# Patient Record
Sex: Male | Born: 1935 | Race: White | Hispanic: No | Smoking: Never smoker
Health system: Southern US, Community
[De-identification: ages and names within clinical notes are randomized; demographics above are authoritative.]

## PROBLEM LIST (undated history)

## (undated) DIAGNOSIS — M79609 Pain in unspecified limb: Secondary | ICD-10-CM

## (undated) DIAGNOSIS — L570 Actinic keratosis: Secondary | ICD-10-CM

## (undated) DIAGNOSIS — Z13220 Encounter for screening for lipoid disorders: Secondary | ICD-10-CM

## (undated) DIAGNOSIS — G4700 Insomnia, unspecified: Secondary | ICD-10-CM

## (undated) DIAGNOSIS — R0602 Shortness of breath: Secondary | ICD-10-CM

## (undated) DIAGNOSIS — D485 Neoplasm of uncertain behavior of skin: Secondary | ICD-10-CM

## (undated) DIAGNOSIS — H3320 Serous retinal detachment, unspecified eye: Secondary | ICD-10-CM

## (undated) DIAGNOSIS — M545 Low back pain: Secondary | ICD-10-CM

## (undated) DIAGNOSIS — E559 Vitamin D deficiency, unspecified: Secondary | ICD-10-CM

## (undated) HISTORY — DX: Actinic keratosis: L57.0

## (undated) HISTORY — DX: Serous retinal detachment, unspecified eye: H33.20

## (undated) HISTORY — DX: Vitamin D deficiency, unspecified: E55.9

## (undated) HISTORY — DX: Neoplasm of uncertain behavior of skin: D48.5

## (undated) HISTORY — DX: Pain in unspecified limb: M79.609

## (undated) HISTORY — DX: Low back pain: M54.5

## (undated) HISTORY — DX: Shortness of breath: R06.02

## (undated) HISTORY — DX: Insomnia, unspecified: G47.00

## (undated) HISTORY — DX: Encounter for screening for lipoid disorders: Z13.220

---
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
## 1995-03-04 HISTORY — PX: RETINAL DETACHMENT SURGERY: SHX105

## 1998-03-03 DIAGNOSIS — H3320 Serous retinal detachment, unspecified eye: Secondary | ICD-10-CM

## 1998-03-03 HISTORY — DX: Serous retinal detachment, unspecified eye: H33.20

## 2003-01-02 DIAGNOSIS — M545 Low back pain, unspecified: Secondary | ICD-10-CM

## 2003-01-02 HISTORY — DX: Low back pain, unspecified: M54.50

## 2005-01-21 ENCOUNTER — Ambulatory Visit (HOSPITAL_COMMUNITY): Admission: RE | Admit: 2005-01-21 | Discharge: 2005-01-21 | Payer: Self-pay | Admitting: General Surgery

## 2005-01-21 HISTORY — PX: INGUINAL HERNIA REPAIR: SHX194

## 2007-03-04 HISTORY — PX: LID LESION EXCISION: SHX5204

## 2008-06-08 DIAGNOSIS — G4700 Insomnia, unspecified: Secondary | ICD-10-CM

## 2008-06-08 HISTORY — DX: Insomnia, unspecified: G47.00

## 2008-09-07 DIAGNOSIS — R0602 Shortness of breath: Secondary | ICD-10-CM

## 2008-09-07 HISTORY — DX: Shortness of breath: R06.02

## 2009-05-01 ENCOUNTER — Ambulatory Visit: Payer: Self-pay

## 2009-05-01 IMAGING — CT CT ORBITS WO/W CM
2 series · 15 of 30 positions shown, 19 images · non-contrast
Comparison: none

REASON FOR EXAM: orbital mass  DR SIMS [HOSPITAL]  ph
[PHONE_NUMBER]  fx  [PHONE_NUMBER]
COMMENTS:

[Series 2: orbits_wo · axial · 0.33mm/px · z∈[-199,-124]mm · 13 of 31 slices shown, 17 images]
[im 3/31  brain]
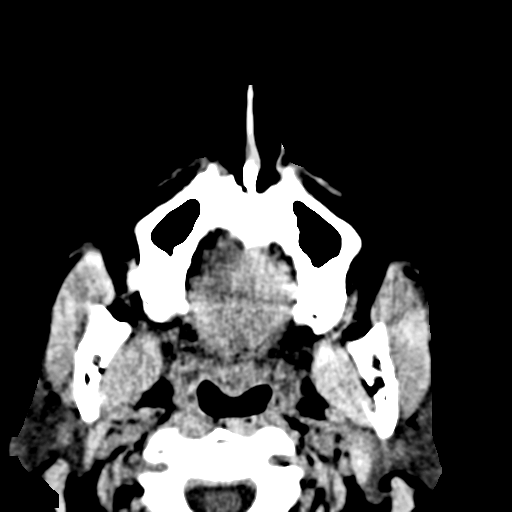
[im 3/31  bone]
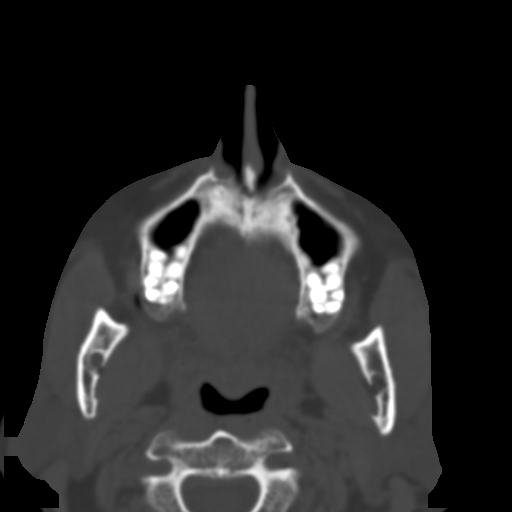
[im 5/31  bone]
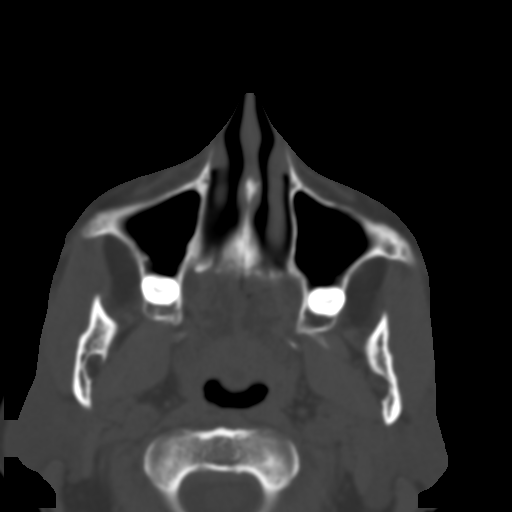
[im 7/31  bone]
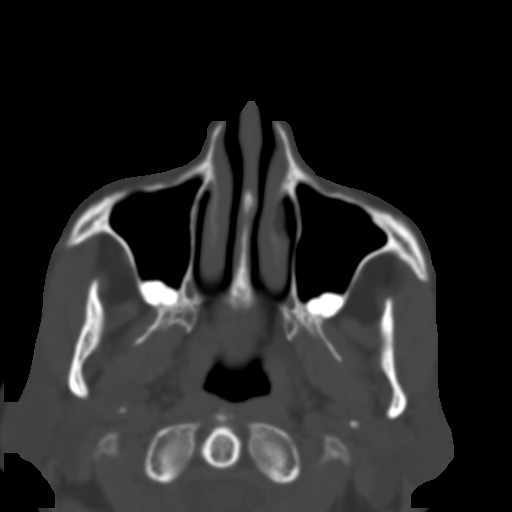
[im 9/31  bone]
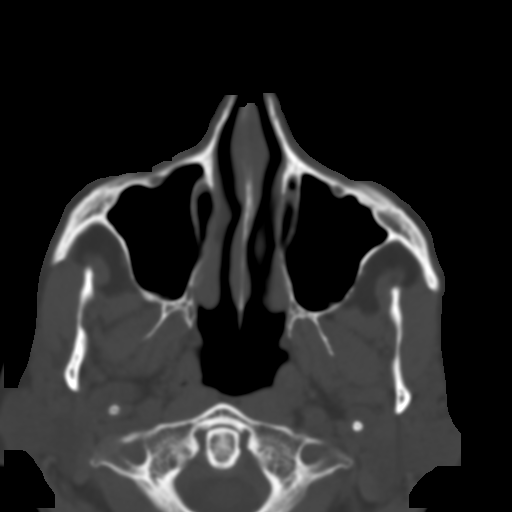
[im 11/31  brain]
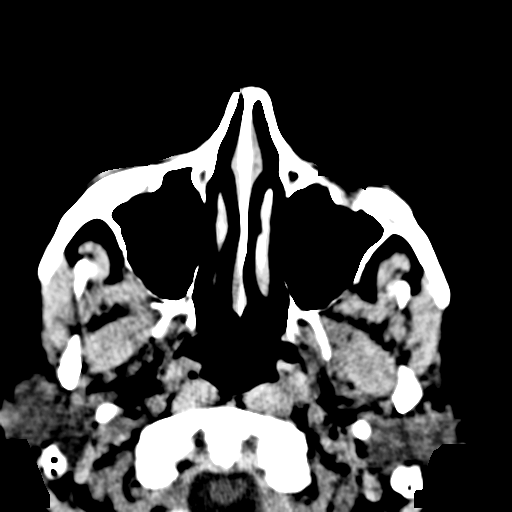
[im 11/31  bone]
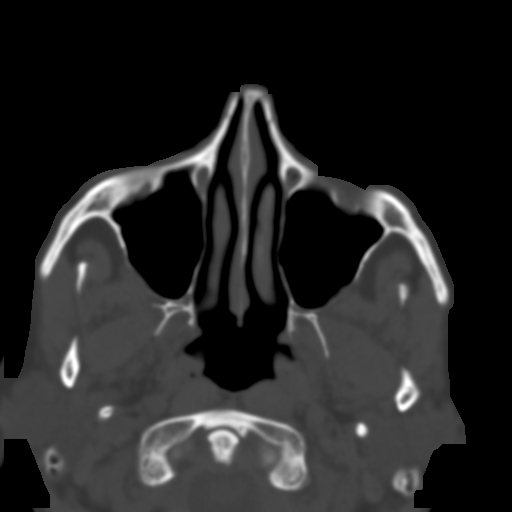
[im 13/31  bone]
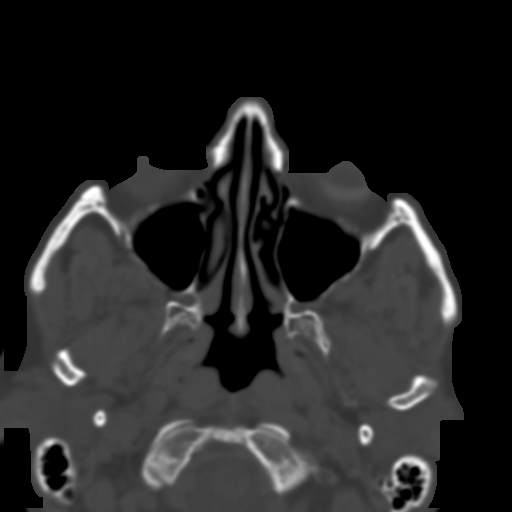
[im 16/31  bone]
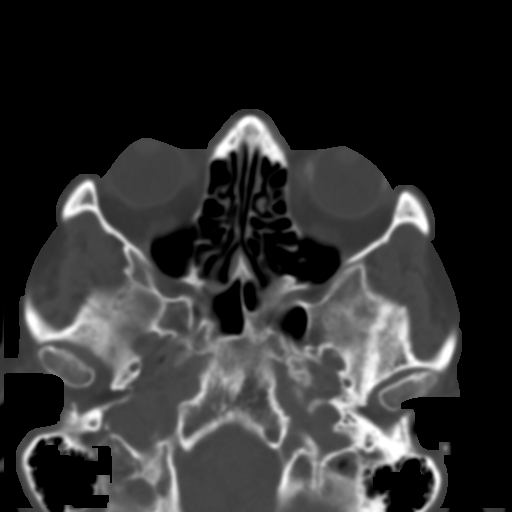
[im 18/31  bone]
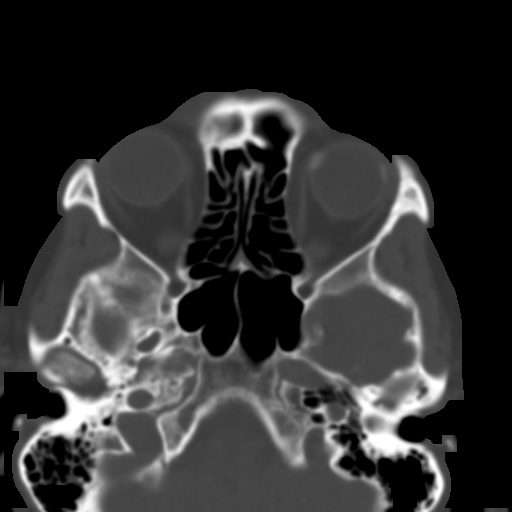
[im 20/31  brain]
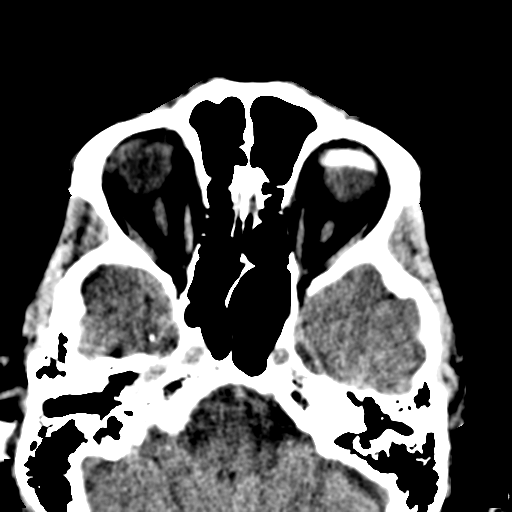
[im 20/31  bone]
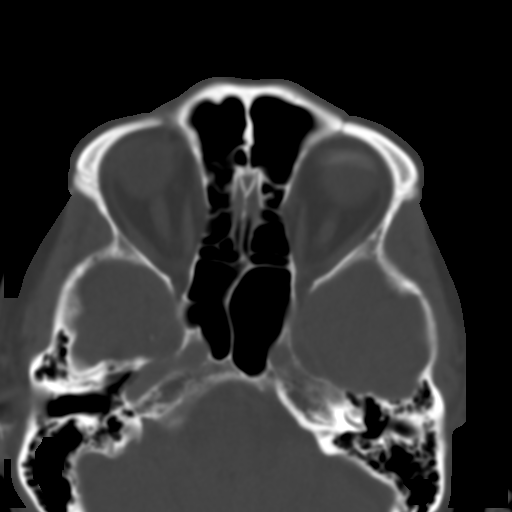
[im 22/31  bone]
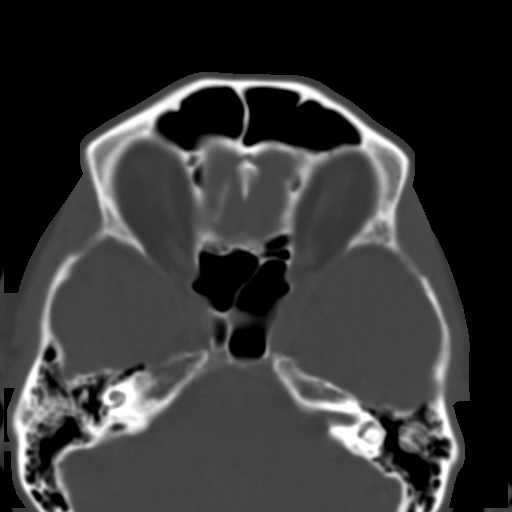
[im 24/31  bone]
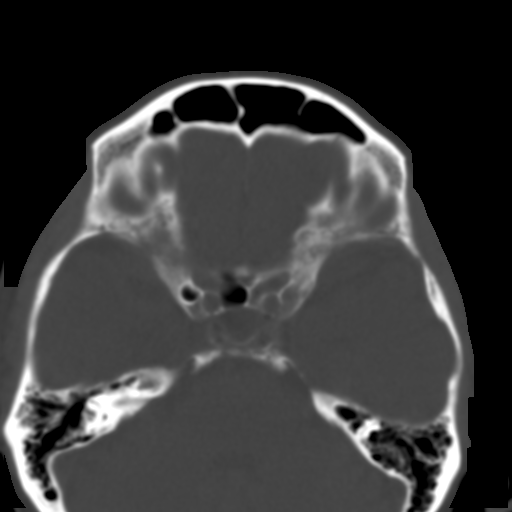
[im 26/31  bone]
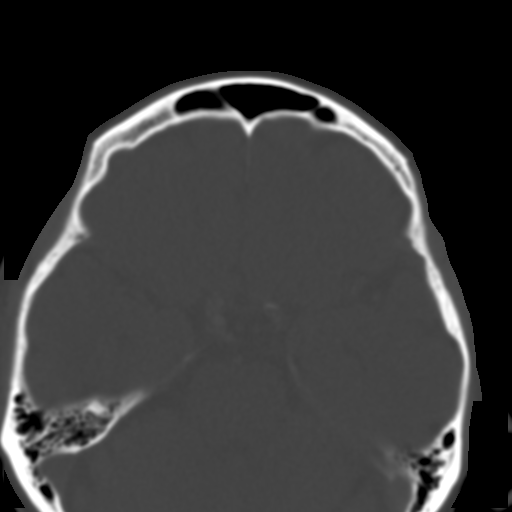
[im 28/31  brain]
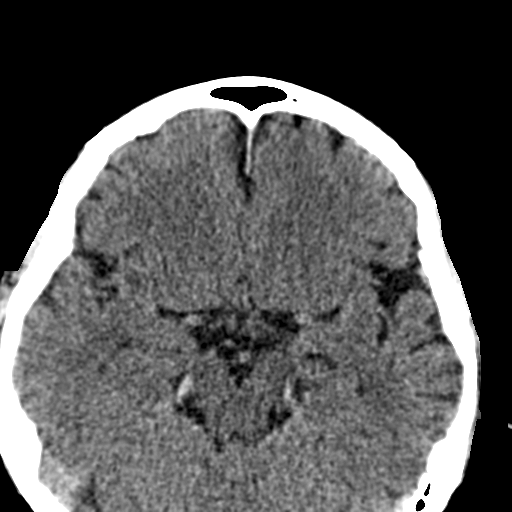
[im 28/31  bone]
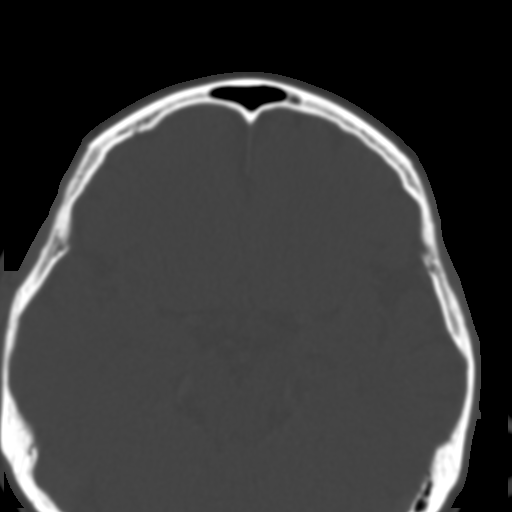

[Series 5: orbits_w st · axial · 0.33mm/px · z∈[-186,-174]mm · 2 of 30 slices shown]
[im 3/30  bone]
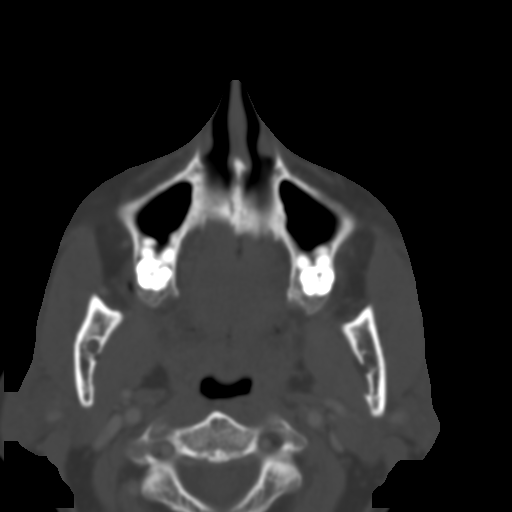
[im 7/30  bone]
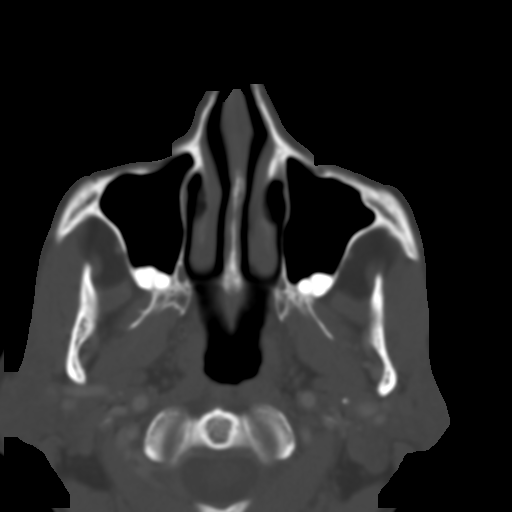

[15 of 30 positions shown; findings below may reference images not displayed]

PROCEDURE:     CT  - CT ORBITS OR TEMPORAL BONE W/WO  - [DATE] [DATE]

RESULT:     Noted along the a medial aspect of the orbit in the region of
the canthal fold is a small soft tissue density measuring approximately
cm corresponding to what appears to be a small varix on clinical exam. 75 mL
of [1D] was administered. There may be minimal enhancement of this
lesion. The exam was performed with Valsalva maneuver. There has been no
significant change in size of the lesion noted. Retrobulbar tissue is
normal. The globe is normal. Paranasal sinuses are clear. Vascular
structures at the skull base are unremarkable.
IMPRESSION: 1.     Small 1.0 cm maximal diameter soft tissue mass along the medial
orbit. No retrobulbar abnormalities are noted.  Minimal enhancement may be
present.  No prominent distention by Valsalva noted. No other associated
lesion is noted. Based on clinical exam and CT findings this could represent
a small varix.
2.     Intraorbital structures and intracranial vascular structures are
normal. The optic nerves, globe and extraocular eye muscles appear normal.

## 2010-12-09 IMAGING — CR DG HUMERUS 2V *R*
2 series · 2 of 2 positions shown · non-contrast
Comparison: None

CLINICAL DATA: Fell 1 week ago with pain

RIGHT HUMERUS - 2+ VIEW

[w humerus ap right *]
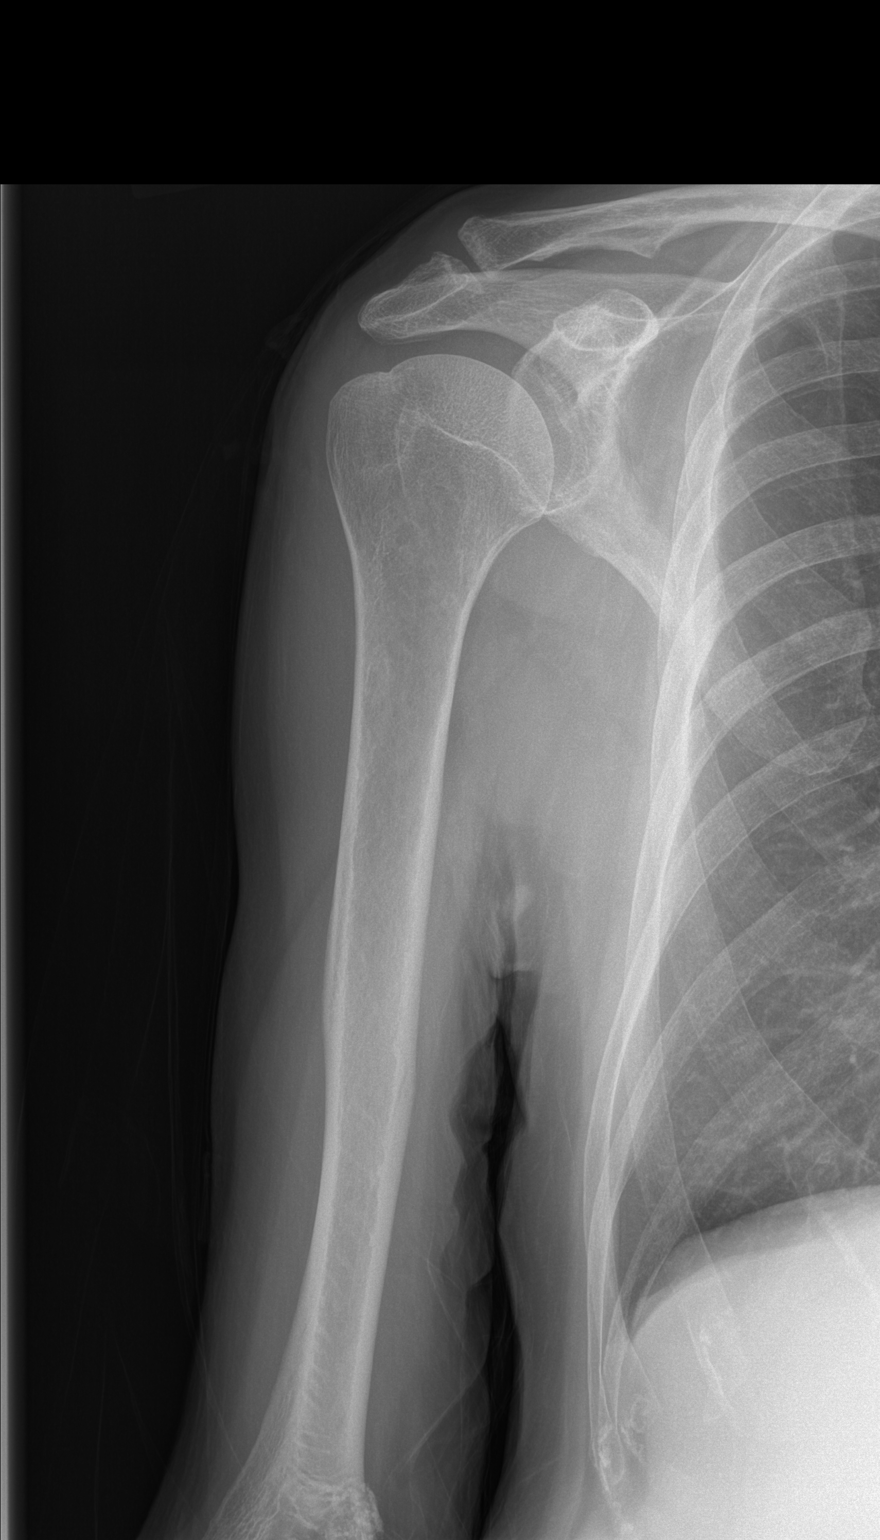

[w humerus lat right *]
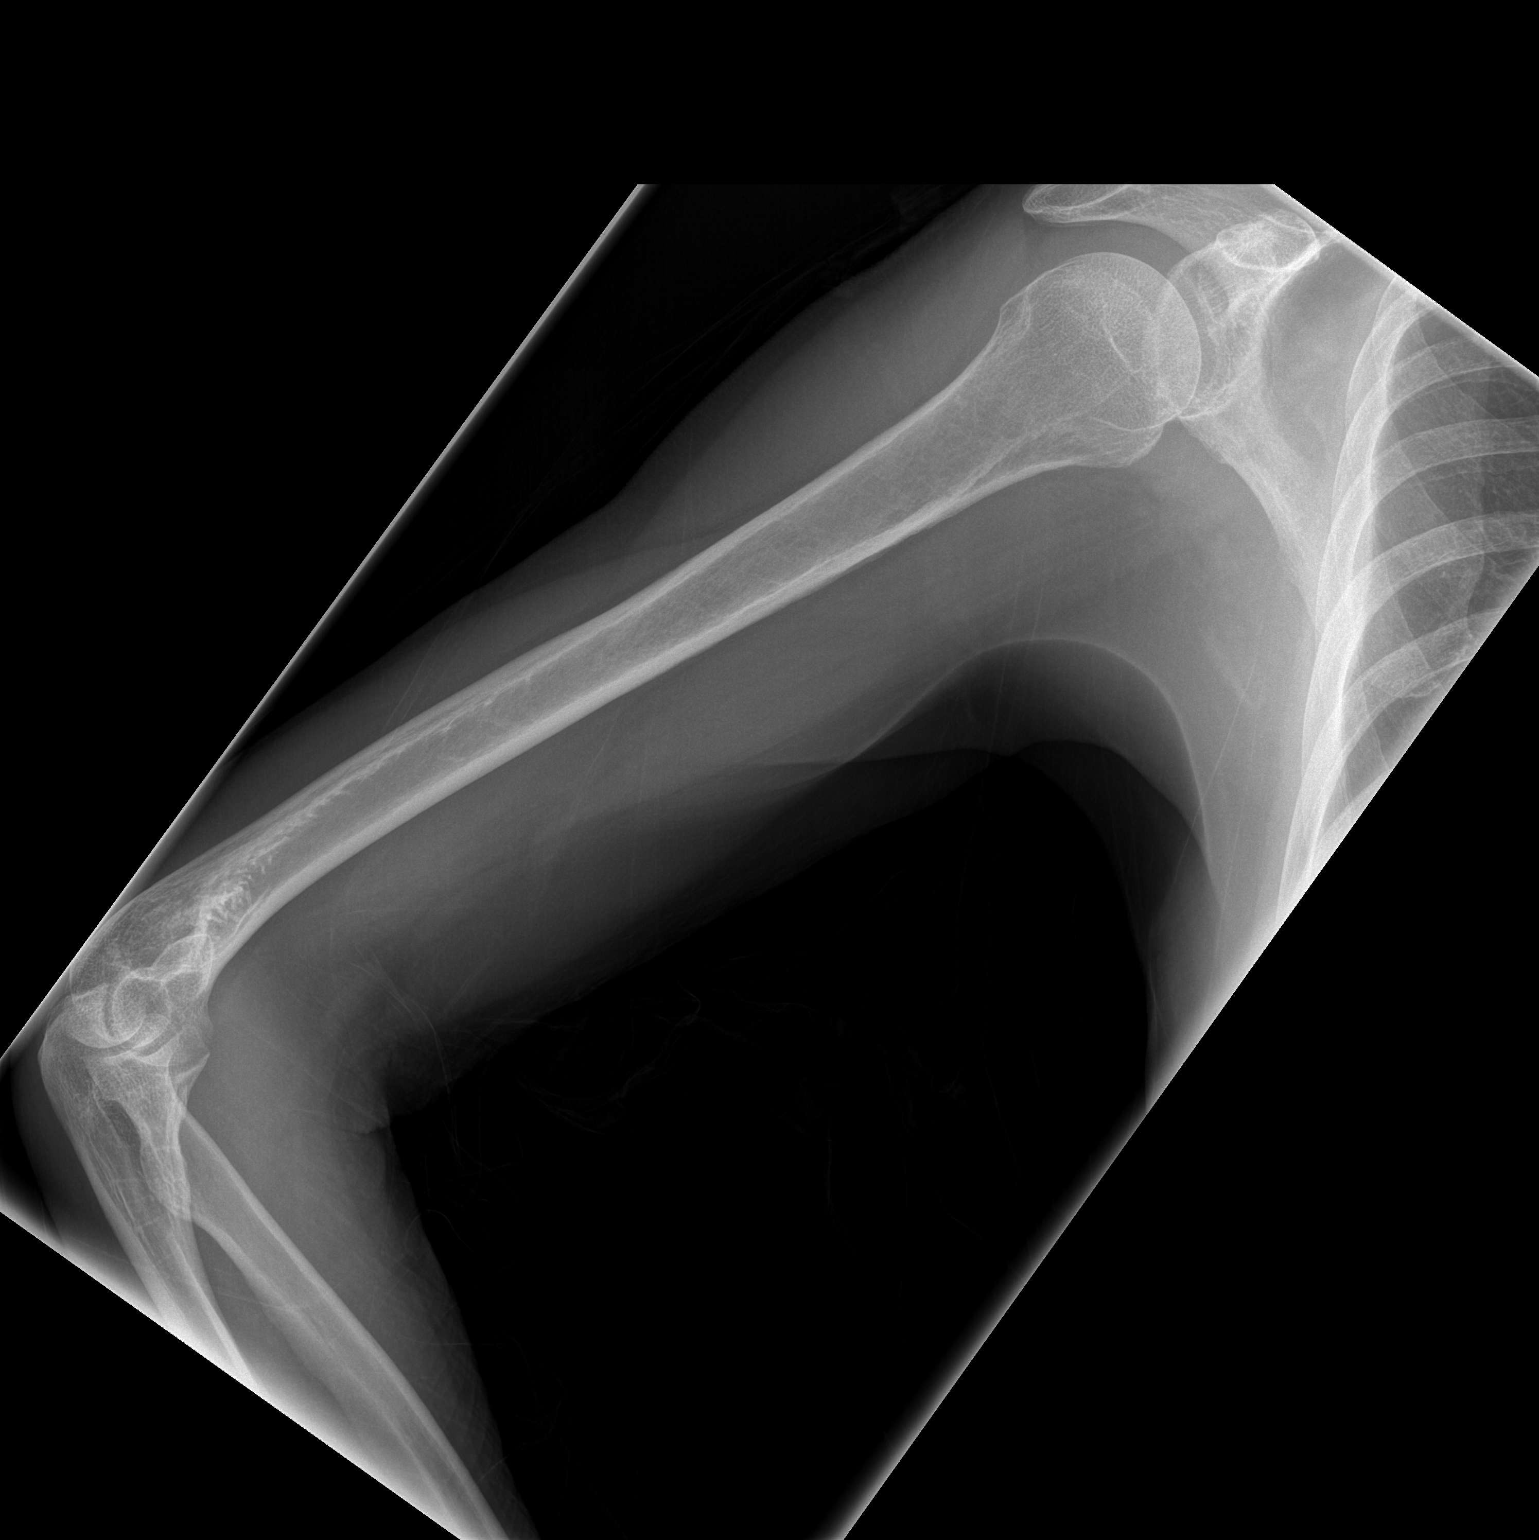

[2 of 2 positions shown; findings below may reference images not displayed]

FINDINGS: Views of the right humerus show no acute fracture.  The
bones are somewhat osteopenic.  The humeral head is normally
positioned within the glenohumeral joint.
IMPRESSION: Negative right humerus.

## 2011-02-05 ENCOUNTER — Ambulatory Visit
Admission: RE | Admit: 2011-02-05 | Discharge: 2011-02-05 | Disposition: A | Discharge status: Home / Self Care | Payer: Medicare Other | Source: Ambulatory Visit | Attending: Internal Medicine | Admitting: Internal Medicine

## 2011-02-05 ENCOUNTER — Other Ambulatory Visit: Payer: Self-pay | Admitting: Internal Medicine

## 2011-02-05 DIAGNOSIS — M79609 Pain in unspecified limb: Secondary | ICD-10-CM

## 2011-02-05 DIAGNOSIS — G8929 Other chronic pain: Secondary | ICD-10-CM

## 2011-02-05 HISTORY — DX: Pain in unspecified limb: M79.609

## 2011-02-05 IMAGING — CR DG FOREARM 2V*R*
2 series · 2 of 2 positions shown · non-contrast
Comparison: None.

CLINICAL DATA: Fell 1 week ago with pain

RIGHT FOREARM - 2 VIEW

[x forearm ap right]
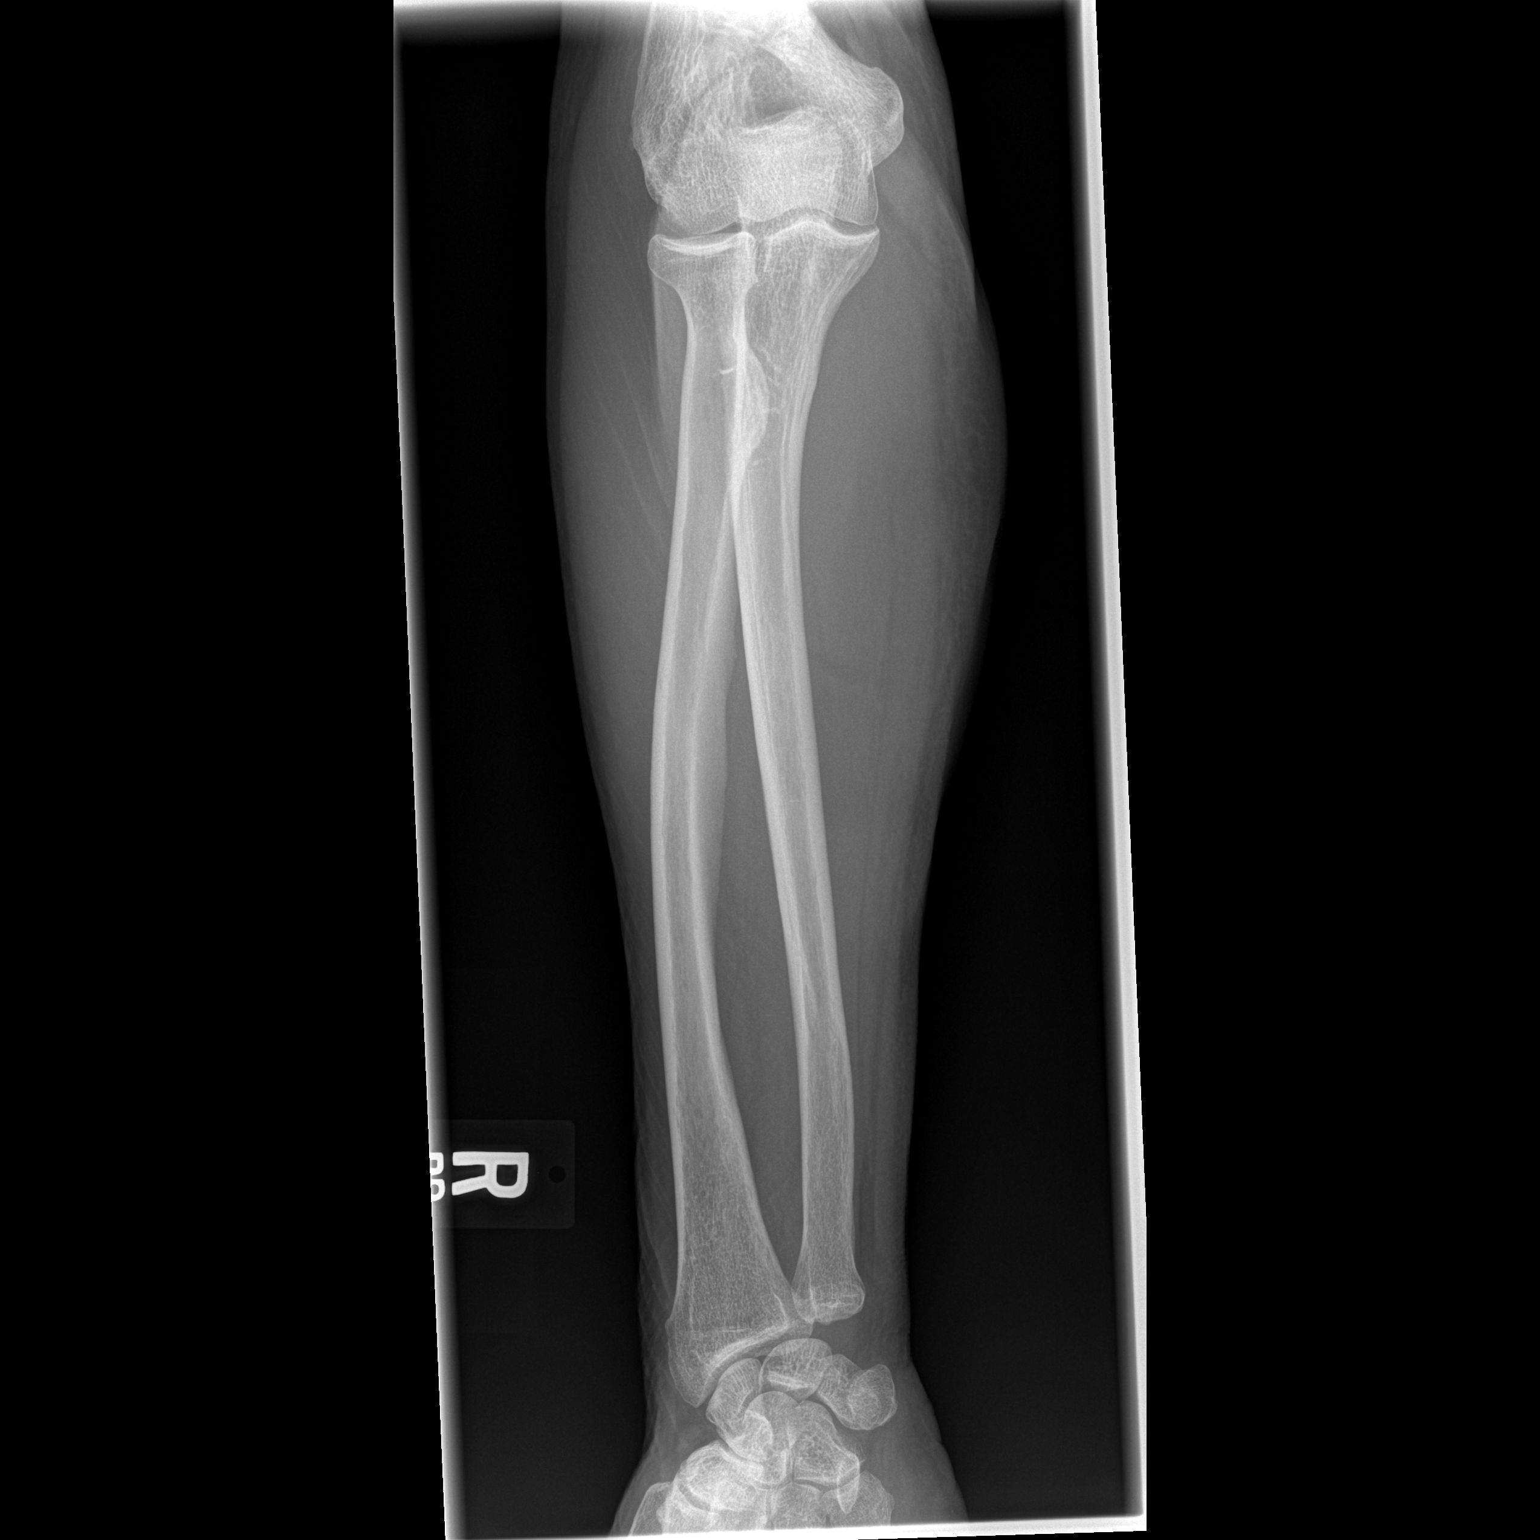

[x forearm lat right]
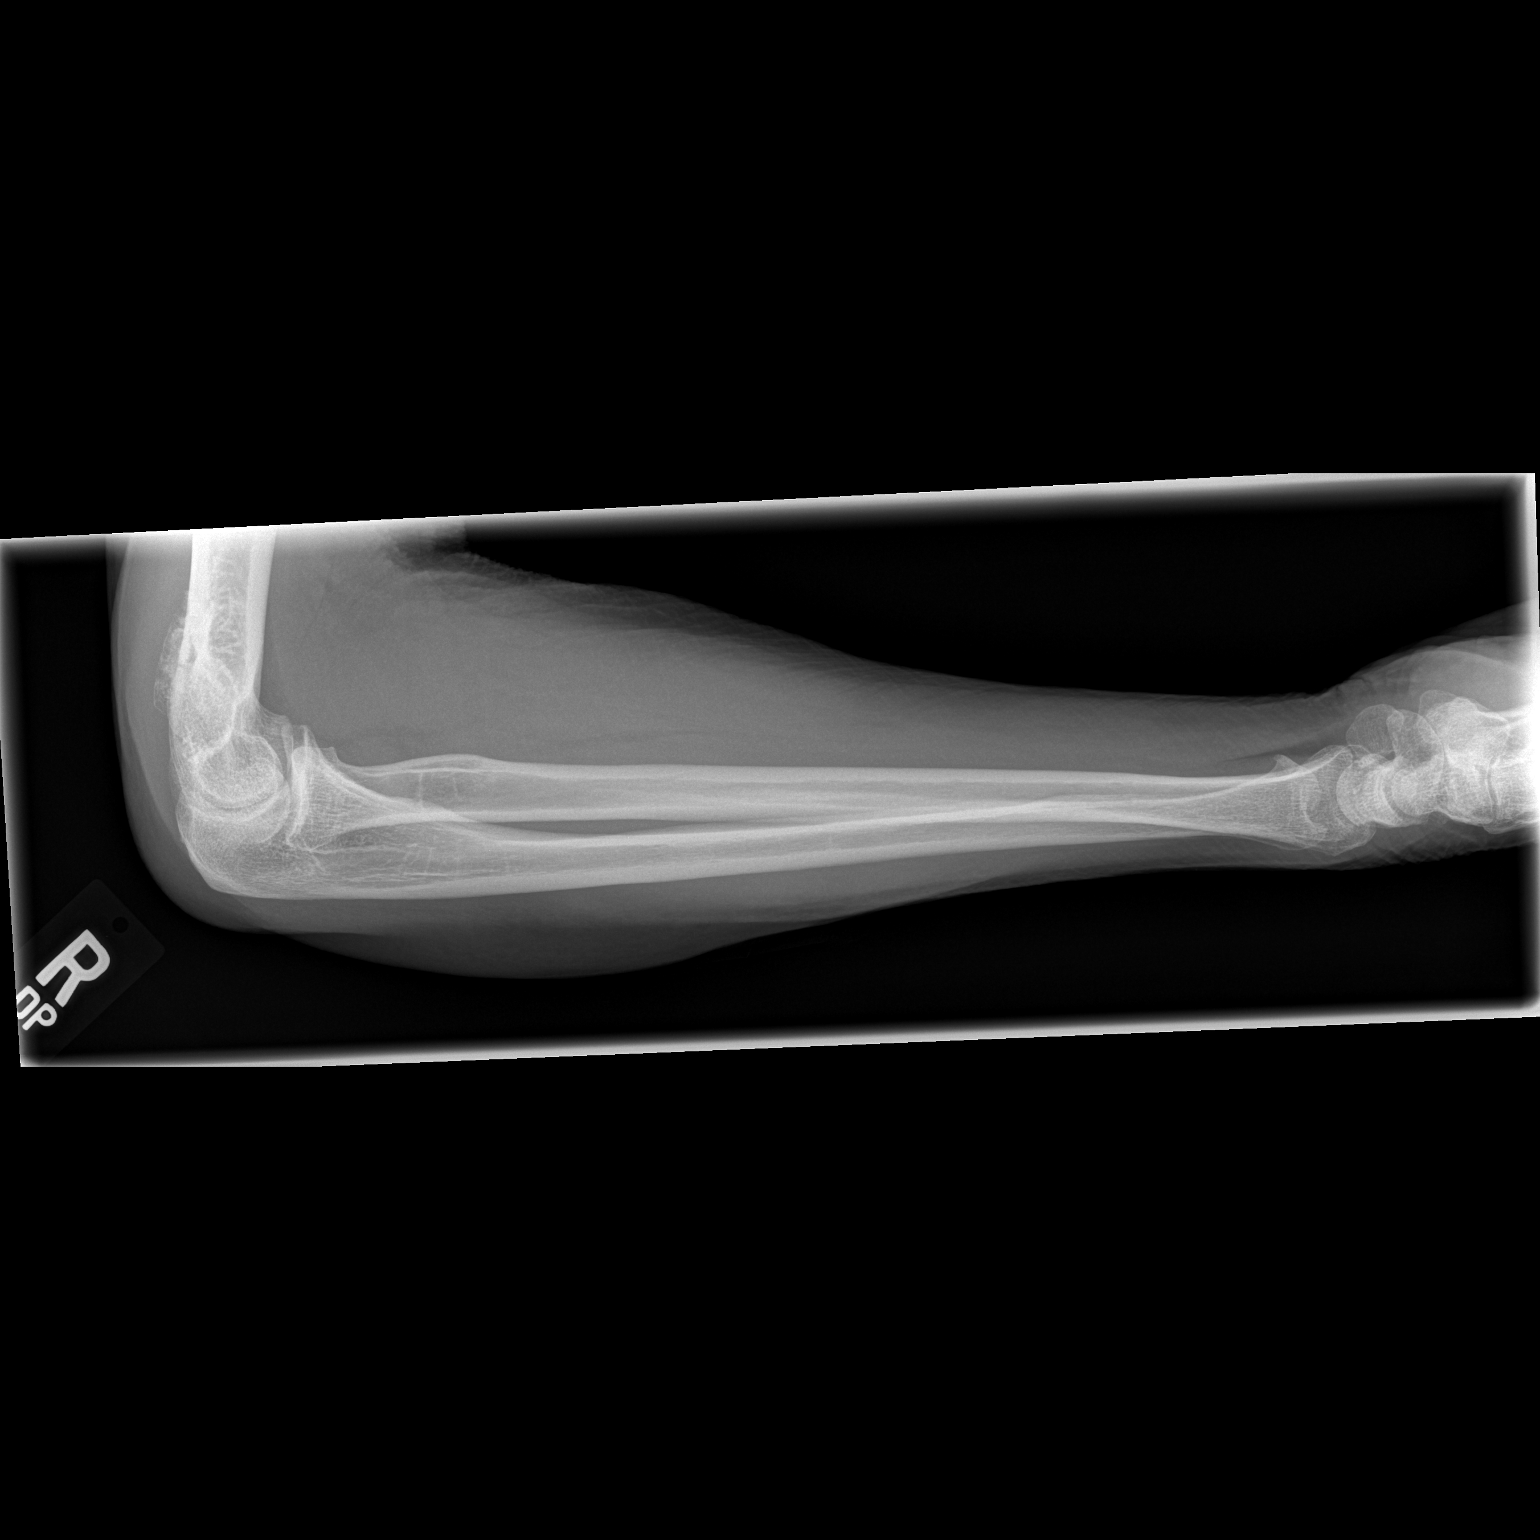

[2 of 2 positions shown; findings below may reference images not displayed]

FINDINGS: No acute fracture is seen.  Alignment is normal.  No
elbow joint effusion is seen.  There is a bony density along the
dorsal aspect of the distal right humerus of uncertain
significance.  If further assessment is warranted, MRI would be
recommended.
IMPRESSION: 1.  No fracture.  No joint effusion.
2.  Abnormal bony density along the distal right humerus
posteriorly.  Consider MRI to assess further.

## 2012-03-09 LAB — CBC AND DIFFERENTIAL
HCT: 40 % — AB (ref 41–53)
Hemoglobin: 13.7 g/dL (ref 13.5–17.5)
Platelets: 248 10*3/uL (ref 150–399)
WBC: 4.9 10^3/mL

## 2012-03-09 LAB — LIPID PANEL
Cholesterol: 218 mg/dL — AB (ref 0–200)
HDL: 73 mg/dL — AB (ref 35–70)
LDL Cholesterol: 133 mg/dL
Triglycerides: 59 mg/dL (ref 40–160)

## 2012-03-09 LAB — BASIC METABOLIC PANEL
BUN: 14 mg/dL (ref 4–21)
Creatinine: 0.9 mg/dL (ref 0.6–1.3)
Glucose: 90 mg/dL
Potassium: 4.3 mmol/L (ref 3.4–5.3)
Sodium: 140 mmol/L (ref 137–147)

## 2012-03-09 LAB — HEPATIC FUNCTION PANEL
ALT: 10 U/L (ref 10–40)
AST: 17 U/L (ref 14–40)

## 2012-03-09 LAB — HEMOGLOBIN A1C: Hgb A1c MFr Bld: 5.9 % (ref 4.0–6.0)

## 2012-04-05 DIAGNOSIS — L570 Actinic keratosis: Secondary | ICD-10-CM | POA: Insufficient documentation

## 2012-04-05 DIAGNOSIS — D485 Neoplasm of uncertain behavior of skin: Secondary | ICD-10-CM

## 2012-04-05 HISTORY — DX: Actinic keratosis: L57.0

## 2012-04-05 HISTORY — DX: Neoplasm of uncertain behavior of skin: D48.5

## 2012-05-01 HISTORY — PX: EXTERNAL EAR SURGERY: SHX627

## 2012-05-28 HISTORY — PX: MOHS SURGERY: SHX181

## 2012-08-04 DIAGNOSIS — E559 Vitamin D deficiency, unspecified: Secondary | ICD-10-CM | POA: Insufficient documentation

## 2012-08-04 HISTORY — DX: Vitamin D deficiency, unspecified: E55.9

## 2012-08-10 ENCOUNTER — Non-Acute Institutional Stay: Payer: Medicare Other | Admitting: Geriatric Medicine

## 2012-08-10 DIAGNOSIS — S0101XA Laceration without foreign body of scalp, initial encounter: Secondary | ICD-10-CM | POA: Insufficient documentation

## 2012-08-10 DIAGNOSIS — S0100XA Unspecified open wound of scalp, initial encounter: Secondary | ICD-10-CM

## 2012-08-10 NOTE — Assessment & Plan Note (Signed)
Pt. Sustained scalp laceration to top of head when he hit a tree bracnh while ducking under it. Wound repaired with sutures, covered with Steri Strips. Advised pt to apply ice to reduce swelling/bruising. Take Tylenol if headache, rest, plenty of fluids today. Keep wound dry. Return to clinic 1 week for suture removal

## 2012-08-10 NOTE — Progress Notes (Signed)
Patient ID: Alec Weiss, male   DOB: Oct 31, 1926, 79 y.o.   MRN: [id number] [facility] [phone])  Chief Complaint  Patient presents with  . Head Laceration    HPI: This is a 79 y.o. male resident of [facility],  Independent Living  section.  Evaluation is requested today due to scalp laceration. He was walking outside, ducked under a tree branch hit his head on the tree.  No LOC, immediate bleeding. Clinic nurse assessed, requested evaluation for suture repair.    Allergies  Allergies  Allergen Reactions  . Maxitrol (Neomycin-Polymyxin-Dexameth)   . Neosporin (Neomycin-Bacitracin Zn-Polymyx) Itching and Rash   Medications Reviewed  Review of Systems  DATA OBTAINED: from patient GENERAL: Feels well   No fevers, fatigue, change in appetite or weight EYES: No eye pain,  No change in vision EARS: No earache, tinnitus, change in hearing NOSE: No congestion, drainage or bleeding MOUTH/THROAT: No mouth or tooth pain  No sore throat No difficulty chewing or swallowing RESPIRATORY: No cough, wheezing, SOB CARDIAC: No chest pain, palpitations  No edema. GI: No abdominal pain  No N/V MUSCULOSKELETAL: No joint pain, swelling or stiffness  No back pain  No muscle ache, pain, weakness  Gait is steady   NEUROLOGIC: No dizziness, fainting, headache  No change in mental status.  PSYCHIATRIC: No feelings of anxiety, depression Sleeps well.    Physical Exam Filed Vitals:   08/10/12 1151  BP: 120/70  Pulse: 87  Temp: 97.7 F (36.5 C)  Resp: 20  SpO2: 98%   GENERAL APPEARANCE: No acute distress, appropriately groomed, normal body habitus. Alert, pleasant, conversant. HEAD: Normocephalic  Top of head with approx 3mm x 1cm L-shaped laceration. Full thickness at angle, moderate oozing at skin edges.  EYES: Conjunctiva/lids clear. Pupils round, reactive.  EARS:. Hearing grossly normal. NOSE: No deformity or discharge. MOUTH/THROAT: Lips w/o lesions.  Oral mucosa, tongue moist, w/o lesion. Oropharynx w/o redness or lesions.  NECK: Supple, full ROM.  RESPIRATORY: Breathing is even, unlabored. Lung sounds are clear and full.  CARDIOVASCULAR: Heart RRR. No murmur or extra heart sounds  EDEMA: No peripheral  edema.  MUSCULOSKELETAL: Moves all extremities with full ROM, strength and tone. NEUROLOGIC: Oriented to time, place, person. Cranial nerves 2-12 grossly intact, speech clear, no tremor.  PSYCHIATRIC: Mood and affect appropriate to situation  PROCEDURE: Explained wound and suture repair, including local anesthetic, to patient. He agreed to proceed Wound cleansed w/ saline, hair trimmed w/ scissor. Applied betadine paint to wound. Infiltrated skin with 1% Lidocaine- approx 6cc.  Placed 4 vertical mattress sutures (3-0 nylon) along wound to approximate skin edges/ obtain hemostasis. Covered with Steri Strips. Pt. Tolerated procedure well.  ASSESSMENT/PLAN  Scalp laceration Pt. Sustained scalp laceration to top of head when he hit a tree bracnh while ducking under it. Wound repaired with sutures, covered with Steri Strips. Advised pt to apply ice to reduce swelling/bruising. Take Tylenol if headache, rest, plenty of fluids today. Keep wound dry. Return to clinic 1 week for suture removal    Follow up: 1 week  Ivori Storr T.Ilya Ess, NP-C 08/10/2012

## 2012-08-18 ENCOUNTER — Non-Acute Institutional Stay: Payer: Medicare Other | Admitting: Geriatric Medicine

## 2012-08-18 ENCOUNTER — Encounter: Payer: Self-pay | Admitting: Geriatric Medicine

## 2012-08-18 VITALS — BP 110/64 | HR 64 | Ht 71.0 in | Wt 160.0 lb

## 2012-08-18 DIAGNOSIS — Z5189 Encounter for other specified aftercare: Secondary | ICD-10-CM

## 2012-08-18 DIAGNOSIS — S0101XD Laceration without foreign body of scalp, subsequent encounter: Secondary | ICD-10-CM

## 2012-08-18 NOTE — Progress Notes (Signed)
Patient ID: Alec Weiss, male   DOB: [birth year], 79 y.o.   MRN: [id number] [facility] [phone])  Chief Complaint  Patient presents with  . Suture / Staple Removal    laceration top of head     HPI: This is a 79 y.o. male resident of [facility],  Independent Living  section.  He returns to clinic today for suture removal. Last week, he was walking outside, ducked under a tree branch hit his head on the tree.  No LOC, immediate bleeding.Laceration repaired with several sutures. He has had no headache, wound had remained intact.    Allergies  Allergies  Allergen Reactions  . Maxitrol (Neomycin-Polymyxin-Dexameth)   . Neosporin (Neomycin-Bacitracin Zn-Polymyx) Itching and Rash   Medications Reviewed  Review of Systems  DATA OBTAINED: from patient GENERAL: Feels well   No fevers, fatigue, change in appetite or weight EYES: No eye pain,  No change in vision RESPIRATORY: No cough, wheezing, SOB CARDIAC: No chest pain, palpitations  No edema. GI: No abdominal pain  No N/V NEUROLOGIC: No dizziness, fainting, headache  No change in mental status.  PSYCHIATRIC: No feelings of anxiety, depression Sleeps well.    Physical Exam Filed Vitals:   08/18/12 1539  BP: 110/64  Pulse: 64  Height: 5\' 11"  (1.803 m)  Weight: 160 lb (72.576 kg)   GENERAL APPEARANCE: No acute distress, appropriately groomed, normal body habitus. Alert, pleasant, conversant. HEAD: Normocephalic  Top of head with approx 3mm x 1cm L-shaped laceration. 4 sutures removed, skin edges approximated well. EYES: Conjunctiva/lids clear. Pupils round, reactive.  EARS:. Hearing grossly normal. NOSE: No deformity or discharge. RESPIRATORY: Breathing is even, unlabored.  MUSCULOSKELETAL: Moves all extremities with full ROM, strength and tone. Gait steady NEUROLOGIC: Oriented to time, place, person.Speech clear, no tremor.  PSYCHIATRIC: Mood and affect appropriate to  situation  .  ASSESSMENT/PLAN  No problem-specific assessment & plan notes found for this encounter.   Follow up: 1 week  Harlem Thresher T.Jojuan Champney, NP-C 08/18/2012

## 2012-08-18 NOTE — Assessment & Plan Note (Signed)
Wound healing well, sutures removed today

## 2012-09-13 ENCOUNTER — Non-Acute Institutional Stay: Payer: Medicare Other | Admitting: Internal Medicine

## 2012-09-13 ENCOUNTER — Encounter: Payer: Self-pay | Admitting: Internal Medicine

## 2012-09-13 VITALS — BP 114/62 | HR 72 | Ht 71.0 in | Wt 165.0 lb

## 2012-09-13 DIAGNOSIS — M25519 Pain in unspecified shoulder: Secondary | ICD-10-CM | POA: Insufficient documentation

## 2012-09-13 DIAGNOSIS — L570 Actinic keratosis: Secondary | ICD-10-CM

## 2012-09-13 DIAGNOSIS — E559 Vitamin D deficiency, unspecified: Secondary | ICD-10-CM

## 2012-09-13 DIAGNOSIS — M25512 Pain in left shoulder: Secondary | ICD-10-CM

## 2012-09-13 NOTE — Progress Notes (Signed)
Subjective:    Patient ID: Alec Weiss, male    DOB: 1926/09/18, 79 y.o.   MRN: [id number]  HPI Last visit with me was 04/05/2012. At that time he had an actinic keratosis it was enlarging and cutaneous horn of his left ear. Dermatologic referral was made. Both lesions have been removed. 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  Patient is now taking his vitamin D regularly.  Major complaint today is a continuous ache in his left shoulder. He denies any popping or grinding sensations. It does not seem to limit movement except when trying to do something overhead. He does not feel it is important to see orthopedist or get x-rays.  Current Outpatient Prescriptions on File Prior to Visit  Medication Sig Dispense Refill  . Multiple Vitamin (MULTIVITAMIN) capsule Take 1 capsule by mouth daily.      . Vitamin D, Ergocalciferol, (DRISDOL) 50000 UNITS CAPS Take 50,000 Units by mouth every 7 (seven) days.      Marland Kitchen zolpidem (AMBIEN) 10 MG tablet Take 10 mg by mouth at bedtime as needed for sleep.            Review of Systems  Constitutional: Negative for fever, chills, diaphoresis, activity change, appetite change, fatigue and unexpected weight change.  HENT: Negative.   Eyes: Negative.   Respiratory: Negative.   Cardiovascular: Negative.   Gastrointestinal: Negative.   Endocrine: Negative.   Genitourinary: Negative.   Musculoskeletal:       Pain in left shoulder.  Skin:       Previous lesions of the left and actinic keratosis of the scalp (which may have been a squamous cancer) have been removed.  Neurological: Negative.   Hematological: Negative.   Psychiatric/Behavioral: Negative.        Objective:BP 114/62  Pulse 72  Ht 5\' 11"  (1.803 m)  Wt 165 lb (74.844 kg)  BMI 23.02 kg/m2    Physical Exam  Constitutional: He appears well-developed and well-nourished. No distress.  HENT:  Head: Normocephalic and atraumatic.  Eyes: Conjunctivae and EOM are normal.  Pupils are equal, round, and reactive to light. Right eye exhibits no discharge. Left eye exhibits no discharge. No scleral icterus.  Neck: No JVD present. No tracheal deviation present. No thyromegaly present.  Cardiovascular: Normal rate, regular rhythm, normal heart sounds and intact distal pulses.  Exam reveals no gallop and no friction rub.   No murmur heard. Pulmonary/Chest: Effort normal and breath sounds normal. No respiratory distress. He has no wheezes. He has no rales. He exhibits no tenderness.  Abdominal: Soft. Bowel sounds are normal. He exhibits no distension and no mass. There is no tenderness.  Musculoskeletal: Normal range of motion. He exhibits no edema and no tenderness.  There is some discomfort in the left shoulder when he raises his left arm and rotates it. There is a sensation of the shoulder/hand normally against the acromion. There is no grinding. The shoulder is not swollen. There is no subacromial tenderness. Biceps tendons are normal.  Lymphadenopathy:    He has no cervical adenopathy.  Skin:  Small scale on the vertex of the scalp where he had a scalp laceration from a tree branch.  Psychiatric: He has a normal mood and affect. His behavior is normal. Judgment and thought content normal.       Assessment & Plan:  Actinic keratosis: Removed  Vitamin D deficiency: Using supplements regularly  Pain in joint, shoulder region, left: Mild to moderate pain.  Patient is not interested in referral or x-rays at this time. I suspect this is a rotator cuff problem

## 2012-09-13 NOTE — Patient Instructions (Signed)
Continue current medications. 

## 2012-12-14 ENCOUNTER — Other Ambulatory Visit: Payer: Self-pay | Admitting: *Deleted

## 2012-12-14 MED ORDER — VITAMIN D (ERGOCALCIFEROL) 1.25 MG (50000 UNIT) PO CAPS: 50000.0000 [IU] | ORAL_CAPSULE | ORAL | Status: DC

## 2013-03-08 ENCOUNTER — Other Ambulatory Visit: Payer: Self-pay | Admitting: Internal Medicine

## 2013-03-16 ENCOUNTER — Encounter: Payer: Self-pay | Admitting: Geriatric Medicine

## 2013-03-16 ENCOUNTER — Non-Acute Institutional Stay: Payer: Medicare Other | Admitting: Geriatric Medicine

## 2013-03-16 VITALS — BP 118/64 | HR 68 | Wt 163.0 lb

## 2013-03-16 DIAGNOSIS — Z66 Do not resuscitate: Secondary | ICD-10-CM

## 2013-03-16 DIAGNOSIS — E559 Vitamin D deficiency, unspecified: Secondary | ICD-10-CM

## 2013-03-16 DIAGNOSIS — G4700 Insomnia, unspecified: Secondary | ICD-10-CM

## 2013-03-16 NOTE — Progress Notes (Signed)
Patient ID: Alec Weiss, male   DOB: Feb 16, 1927, 80 y.o.   MRN: [id number]   [facility] [phone])  Code Status: DNR  Contact Information   Name Relation Home Work Mobile   Deak,Christine Daughter [phone]     Almer, Bushey [phone]         Chief Complaint  Patient presents with  . Medical Managment of Chronic Issues    HPI: This is a 80 y.o. male resident of [facility], Independent Living section evaluated today for management of ongoing medical issues.   Recent visits: 09/13/2012, Dr. Nyoka Cowden Actinic keratosis: Removed  Vitamin D deficiency: Using supplements regularly  Pain in joint, shoulder region, left: Mild to moderate pain. Patient is not interested in referral or x-rays at this time. I suspect this is a rotator cuff problem  Since last visit patient does not have any acute medical issues. 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    Allergies  Allergen Reactions  . Maxitrol [Neomycin-Polymyxin-Dexameth]   . Neosporin [Neomycin-Bacitracin Zn-Polymyx] Itching and Rash       Medication List       This list is accurate as of: 03/16/13 11:59 PM.  Always use your most recent med list.               Vitamin D (Ergocalciferol) 50000 UNITS Caps capsule  Commonly known as:  DRISDOL  TAKE ONE CAPSULE ONCE A WEEK FOR VITAMIN D SUPPLEMENT.     zolpidem 10 MG tablet  Commonly known as:  AMBIEN  Take 10 mg by mouth at bedtime as needed for sleep.        DATA REVIEWED  Radiologic Exams:   Cardiovascular Exams:   Laboratory Studies Lab Results- [facility] 03/09/2012  Component Value   WBC 4.9   HGB 13.7   HCT 40*   PLT 248       CHOL 218*   TRIG 59   HDL 73*   LDLCALC 133       Glucose 90   ALT 10   AST 17   NA 140   K 4.3   CREATININE 0.9   BUN 14   Albumin 4.5       Vit D 29       HGBA1C 5.9       PSA 2.2      REVIEW OF SYSTEMS  DATA OBTAINED: from  patient GENERAL: Feels well   No fevers, fatigue, change in appetite or weight SKIN: No itch, rash or open wounds EYES: No eye pain, dryness or itching  No change in vision EARS: No earache, tinnitus, change in hearing NOSE: No congestion, drainage or bleeding MOUTH/THROAT: No mouth or tooth pain    No sore throat    No difficulty chewing or swallowing RESPIRATORY: No cough, wheezing, SOB CARDIAC: No chest pain, palpitations  No edema. GI: No abdominal pain  No N/V/D or constipation  No heartburn or reflux  GU: No dysuria, frequency or urgency  No change in urine volume or character   No nocturia or change in stream   MUSCULOSKELETAL: No joint pain, swelling or stiffness  No back pain  No muscle ache, pain, weakness  Gait is steady  No recent falls.  NEUROLOGIC: No dizziness, fainting, headache  No change in mental status.  PSYCHIATRIC: No feelings of anxiety, depression  Usually sleeps well, sometimes uses Ambien   PHYSICAL EXAM Filed Vitals:   03/16/13 1420  BP: 118/64  Pulse: 68  Weight: 163 lb (73.936 kg)   Body mass index is 22.74 kg/(m^2).  GENERAL APPEARANCE: No acute distress, appropriately groomed, normal body habitus. Alert, pleasant, conversant. SKIN: No diaphoresis, rash, unusual lesions, wounds HEAD: Normocephalic, atraumatic EYES: Conjunctiva/lids clear. Pupils round, reactive.   EARS: External exam WNL  Hearing grossly normal. NOSE: No deformity or discharge. MOUTH/THROAT: Lips w/o lesions. Oral mucosa, tongue moist, w/o lesion. Oropharynx w/o redness or lesions.  NECK: Supple, full ROM. No thyroid tenderness, enlargement or nodule LYMPHATICS: No head, neck or supraclavicular adenopathy RESPIRATORY: Breathing is even, unlabored. Lung sounds are clear and full.  CARDIOVASCULAR: Heart RRR. No murmur or extra heart sounds  ARTERIAL: No carotid bruit.   EDEMA: No peripheral edema.  GASTROINTESTINAL: Abdomen is soft, non-tender, not distended w/ normal bowel sounds.    MUSCULOSKELETAL: Moves all extremities with full ROM, strength and tone.  Gait is steady NEUROLOGIC: Oriented to time, place, person. Cranial nerves 2-12 grossly intact, speech clear, no tremor.  PSYCHIATRIC: Mood and affect appropriate to situation  ASSESSMENT/PLAN  Vitamin D deficiency Continues supplementation, update labs  Insomnia Sometimes has difficulty falling asleep, utilizes Ambien. Denies any adverse affects, specifically no morning grogginess, confusion or gait disturbance    Follow up:  6 months, Dr. Nyoka Cowden, [place]  Lab: CBC, CMP, TSH, vitamin D  Juanjesus Pepperman T.Alexsandro Salek, NP-C 03/16/2013

## 2013-03-24 ENCOUNTER — Encounter: Payer: Self-pay | Admitting: Geriatric Medicine

## 2013-03-24 DIAGNOSIS — G4700 Insomnia, unspecified: Secondary | ICD-10-CM | POA: Insufficient documentation

## 2013-03-24 NOTE — Assessment & Plan Note (Signed)
Continues supplementation, update labs

## 2013-03-24 NOTE — Assessment & Plan Note (Signed)
Sometimes has difficulty falling asleep, utilizes Ambien. Denies any adverse affects, specifically no morning grogginess, confusion or gait disturbance

## 2013-04-26 ENCOUNTER — Telehealth: Payer: Self-pay | Admitting: *Deleted

## 2013-04-26 NOTE — Telephone Encounter (Signed)
Patient Notified of Labwork from [facility] 03/31/2013

## 2013-08-17 ENCOUNTER — Other Ambulatory Visit: Payer: Self-pay | Admitting: Internal Medicine

## 2013-09-12 ENCOUNTER — Non-Acute Institutional Stay: Payer: Medicare Other | Admitting: Internal Medicine

## 2013-09-12 VITALS — BP 114/60 | HR 68 | Temp 96.7°F | Ht 70.5 in | Wt 162.0 lb

## 2013-09-12 DIAGNOSIS — E559 Vitamin D deficiency, unspecified: Secondary | ICD-10-CM

## 2013-09-12 DIAGNOSIS — M25512 Pain in left shoulder: Secondary | ICD-10-CM

## 2013-09-12 DIAGNOSIS — G4700 Insomnia, unspecified: Secondary | ICD-10-CM

## 2013-09-12 DIAGNOSIS — M25519 Pain in unspecified shoulder: Secondary | ICD-10-CM

## 2013-09-12 NOTE — Progress Notes (Signed)
Patient ID: Alec Weiss, male   DOB: 05/01/1926, 80 y.o.   MRN: [id number]    Location:  [facility] (12)  Extended Emergency Contact Information Primary Emergency Contact: Laughner,Christine  [place] Phone: [phone] Relation: Daughter Secondary Emergency Contact: Osier,Ellen Address: [address]          [place] [address] [place] Phone: [phone] Relation: Spouse   Chief Complaint  Patient presents with  . Medical Management of Chronic Issues    Comprehensive exam: insomnia, Vitamin D deficiency    HPI:  Patient presents today for a wellness exam. He believes he is doing well at the present time. He continues to have some mild insomnia. He has some stress related to his wife's significant Alzheimer's disease. 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 Mrs. Vivier has some issues with separation anxiety when her husband is gone.  Past Medical History  Diagnosis Date  . 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 Actinic keratosis 04/05/2012  . Vitamin D deficiency 08/04/2012  . Pain in limb 02/05/2011  . Shortness of breath 09/07/2008  . Insomnia, unspecified 06/08/2008  . Lumbago 01/02/2003    Past Surgical History  Procedure Laterality Date  . Retinal detachment surgery Left [place] lesion excision  2009  . External ear surgery  05/2012    left ear   . Inguinal hernia repair Right 01/21/2005    Dr. [name]  . Mohs surgery Left 05/28/2012    ear lobe Dr. Alethia Berthold    History   Social History  . Marital Status: Married    Spouse Name: N/A    Number of Children: N/A  . Years of Education: N/A   Occupational History  . retired Hydrologist professor    Social History Main Topics  . Smoking status: Never Smoker   . Smokeless tobacco: Never Used  . Alcohol Use: 1.2 oz/week    2 Glasses of wine per week     Comment:  at dinner  . Drug Use: No  . Sexual Activity: Not on file   Other Topics Concern  . Not on file   Social History Narrative   Patient is  Married [name]) since 1958. Retired professor, Hydrologist. Lves in apartment, Independent Living section at [facility] since 02/2012. is primary caregiver for spouse with dementia.   No Smoking history, drinks moderate amount of alcohol    Patient has Advanced planning documents: DNR           reports that he has never smoked. He has never used smokeless tobacco. He reports that he drinks about 1.2 ounces of alcohol per week. He reports that he does not use illicit drugs.  Immunization History  Administered Date(s) Administered  . Hepatitis A 01/24/2002  . Hepatitis B 01/24/2002  . IPV 08/18/2001  . Influenza Whole 12/02/2011, 12/31/2012  . Typhoid Inactivated 07/21/2001  . Zoster 02/01/2011    Allergies  Allergen Reactions  . Maxitrol [Neomycin-Polymyxin-Dexameth]   . Neosporin [Neomycin-Bacitracin Zn-Polymyx] Itching and Rash    Medications: Patient's Medications  New Prescriptions   No medications on file  Previous Medications   VITAMIN D, ERGOCALCIFEROL, (DRISDOL) 50000 UNITS CAPS CAPSULE    TAKE ONE CAPSULE ONCE A WEEK FOR VITAMIN D SUPPLEMENT.   ZOLPIDEM (AMBIEN) 10 MG TABLET    Take 10 mg by mouth at  bedtime as needed for sleep.  Modified Medications   No medications on file  Discontinued Medications   No medications on file     Review of Systems  Constitutional: Negative for fever, chills, diaphoresis, activity change, appetite change, fatigue and unexpected weight change.  HENT: Negative.   Eyes: Negative.   Respiratory: Negative.   Cardiovascular: Negative.   Gastrointestinal: Negative.   Endocrine: Negative.   Genitourinary: Negative.   Musculoskeletal:       Pain in left shoulder.  Skin:       Previous lesions of the left and actinic keratosis of the scalp (which may have been a squamous cancer)  have been removed.  Neurological: Negative.   Hematological: Negative.   Psychiatric/Behavioral: Negative.     Filed Vitals:   09/12/13 1455  BP: 114/60  Pulse: 68  Temp: 96.7 F (35.9 C)  TempSrc: Oral  Height: 5' 10.5" (1.791 m)  Weight: 162 lb (73.483 kg)   Body mass index is 22.91 kg/(m^2).  Physical Exam  Constitutional: He appears well-developed and well-nourished. No distress.  HENT:  Head: Normocephalic and atraumatic.  Eyes: Conjunctivae and EOM are normal. Pupils are equal, round, and reactive to light. Right eye exhibits no discharge. Left eye exhibits no discharge. No scleral icterus.  Neck: No JVD present. No tracheal deviation present. No thyromegaly present.  Cardiovascular: Normal rate, regular rhythm, normal heart sounds and intact distal pulses.  Exam reveals no gallop and no friction rub.   No murmur heard. Pulmonary/Chest: Effort normal and breath sounds normal. No respiratory distress. He has no wheezes. He has no rales. He exhibits no tenderness.  Abdominal: Soft. Bowel sounds are normal. He exhibits no distension and no mass. There is no tenderness.  Musculoskeletal: Normal range of motion. He exhibits no edema and no tenderness.  There is some discomfort in the left shoulder when he raises his left arm and rotates it. There is a sensation of the shoulder/hand normally against the acromion. There is no grinding. The shoulder is not swollen. There is no subacromial tenderness. Biceps tendons are normal.  Lymphadenopathy:    He has no cervical adenopathy.  Skin:  Small scale on the vertex of the scalp where he had a scalp laceration from a tree branch.  Psychiatric: He has a normal mood and affect. His behavior is normal. Judgment and thought content normal.     Labs reviewed: No visits with results within 3 Month(s) from this visit. Latest known visit with results is:  Nursing Home on 03/16/2013  Component Date Value Ref Range Status  . Hemoglobin  03/09/2012 13.7  13.5 - 17.5 g/dL Final  . HCT 03/09/2012 40* 41 - 53 % Final  . Platelets 03/09/2012 248  150 - 399 K/L Final  . Glucose 03/09/2012 90   Final  . BUN 03/09/2012 14  4 - 21 mg/dL Final  . Creatinine 03/09/2012 0.9  0.6 - 1.3 mg/dL Final  . Potassium 03/09/2012 4.3  3.4 - 5.3 mmol/L Final  . Sodium 03/09/2012 140  137 - 147 mmol/L Final  . Triglycerides 03/09/2012 59  40 - 160 mg/dL Final  . Cholesterol 03/09/2012 218* 0 - 200 mg/dL Final  . HDL 03/09/2012 73* 35 - 70 mg/dL Final  . LDL Cholesterol 03/09/2012 133   Final  . ALT 03/09/2012 10  10 - 40 U/L Final  . AST 03/09/2012 17  14 - 40 U/L Final  . Hemoglobin A1C 03/09/2012 5.9  4.0 - 6.0 % Final  .  WBC 03/09/2012 4.9   Final      Assessment/Plan   1. Pain in joint, shoulder region, left Mild. Patient feels that he can tolerate this. He is not interested in orthopedic referral.  2. Insomnia Mild and tolerable.  3. Vitamin D deficiency Continue vitamin D. Consider recheck of lab at future visits.

## 2013-09-27 ENCOUNTER — Encounter: Payer: Self-pay | Admitting: Internal Medicine

## 2013-12-20 ENCOUNTER — Ambulatory Visit (INDEPENDENT_AMBULATORY_CARE_PROVIDER_SITE_OTHER): Payer: Medicare Other

## 2013-12-20 DIAGNOSIS — Z23 Encounter for immunization: Secondary | ICD-10-CM

## 2014-02-25 ENCOUNTER — Other Ambulatory Visit: Payer: Self-pay | Admitting: Internal Medicine

## 2014-08-23 ENCOUNTER — Encounter: Payer: Self-pay | Admitting: Internal Medicine

## 2014-09-18 ENCOUNTER — Encounter: Payer: Self-pay | Admitting: Internal Medicine

## 2014-09-19 ENCOUNTER — Encounter: Payer: Self-pay | Admitting: Internal Medicine

## 2014-09-20 ENCOUNTER — Encounter: Payer: Self-pay | Admitting: Internal Medicine

## 2014-10-18 ENCOUNTER — Encounter: Payer: Self-pay | Admitting: Internal Medicine

## 2014-11-22 ENCOUNTER — Non-Acute Institutional Stay (INDEPENDENT_AMBULATORY_CARE_PROVIDER_SITE_OTHER): Payer: Medicare Other | Admitting: Internal Medicine

## 2014-11-22 ENCOUNTER — Encounter: Payer: Self-pay | Admitting: Internal Medicine

## 2014-11-22 VITALS — BP 104/60 | HR 79 | Temp 97.3°F | Ht 70.5 in | Wt 158.0 lb

## 2014-11-22 DIAGNOSIS — Z0000 Encounter for general adult medical examination without abnormal findings: Secondary | ICD-10-CM | POA: Diagnosis not present

## 2014-11-22 DIAGNOSIS — Z13220 Encounter for screening for lipoid disorders: Secondary | ICD-10-CM | POA: Diagnosis not present

## 2014-11-22 DIAGNOSIS — Z23 Encounter for immunization: Secondary | ICD-10-CM | POA: Diagnosis not present

## 2014-11-22 DIAGNOSIS — G4700 Insomnia, unspecified: Secondary | ICD-10-CM | POA: Diagnosis not present

## 2014-11-22 DIAGNOSIS — E559 Vitamin D deficiency, unspecified: Secondary | ICD-10-CM | POA: Diagnosis not present

## 2014-11-22 NOTE — Progress Notes (Signed)
Patient ID: Alec Weiss, male   DOB: [birth year], 81 y.o.   MRN: [id number]   Location:  [facility]  Code Status: DNR  Goals of Care:Advanced Directive information Does patient have an advance directive?: Yes, Type of Advance Directive: Living will;Out of facility DNR (pink MOST or yellow form), Pre-existing out of facility DNR order (yellow form or pink MOST form): Yellow form placed in chart (order not valid for inpatient use)  Chief Complaint  Patient presents with  . Annual Exam    Comprehensive exam  . Medical Management of Chronic Issues    insomina, Vitamin D deficiency  . Abrasion    left knee, hit under desk 4 days ago    HPI: Patient is a 81 y.o. white male seen in the [facility] today for his annual wellness exam and med mgt of chronic diseases:  Insomnia, vitamin D deficiency, OS retinal detachment.  Is vulnerable to wounds on his skin.  Bumped left knee on his desk 4 days ago.  Bled some.  Denies residual pain.  Tingling occasionally.    Is careful with his left shoulder--has to be protective of it.    Insomnia:  Very occasionally uses the [place], in fact, months since used.  Takes if he knows he'll have a stressful day the next day.    Fall Risk  11/22/2014 09/12/2013  Falls in the past year? No No   MMSE - Mini Mental State Exam 11/22/2014  Orientation to time 5  Orientation to Place 5  Registration 3  Attention/ Calculation 5  Recall 3  Language- name 2 objects 2  Language- repeat 1  Language- follow 3 step command 3  Language- read & follow direction 1  Write a sentence 1  Copy design 1  Total score 30  Passed clock.  Depression screen [facility] 2/9 11/22/2014 09/27/2013 09/12/2013  Decreased Interest 0 0 0  Down, Depressed, Hopeless 0 0 -  PHQ - 2 Score 0 0 0  Spirits are as good as they can be.    Immunization History  Administered Date(s) Administered  . Hepatitis A 01/24/2002  . Hepatitis B 01/24/2002  . IPV 08/18/2001  . Influenza Whole  12/02/2011, 12/31/2012  . Influenza,inj,Quad PF,36+ Mos 12/20/2013  . Typhoid Inactivated 07/21/2001  . Zoster 02/01/2011  They had traveled in 2013 and had several vaccines then as above.  Agrees to prevnar.    Vision:  Sees Dr. Herbert Deaner for this.  Was screened today.   Hearing:  Says this is ok.   Functional status:  Independent, driving.  Lives in independent apt. Exercise:  Alec Weiss walks when others are with his wife.  Sometimes they go up and downstairs to activities.    Is a retired Pensions consultant at [employer].  Traveled quite a bit.    Past Medical History  Diagnosis Date  . 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 Actinic keratosis 04/05/2012  . Vitamin D deficiency 08/04/2012  . Pain in limb 02/05/2011  . Shortness of breath 09/07/2008  . Insomnia, unspecified 06/08/2008  . Lumbago 01/02/2003  . Retinal detachment 2000    left eye   Past Surgical History  Procedure Laterality Date  . Retinal detachment surgery Left [place] lesion excision  2009  . External ear surgery  05/2012    left ear   . Inguinal hernia repair Right 01/21/2005    Dr. [name]  . Mohs  surgery Left 05/28/2012    ear lobe Dr. [name]   Family History  Problem Relation Age of Onset  . Cancer Mother     breast   Social History   Social History  . Marital Status: Married    Spouse Name: N/A  . Number of Children: N/A  . Years of Education: N/A   Occupational History  . retired Hydrologist professor    Social History Main Topics  . Smoking status: Never Smoker   . Smokeless tobacco: Never Used  . Alcohol Use: 1.2 oz/week    2 Glasses of wine per week     Comment: at dinner  . Drug Use: No  . Sexual Activity: Not on file   Other Topics Concern  . Not on file   Social History Narrative   Patient is  Married Alec Weiss) since 1958. Retired professor, Hydrologist. Lves in apartment, Independent Living section at [facility] since 02/2012.    He is  primary caregiver for spouse with dementia.   No Smoking history, drinks moderate amount of alcohol    Patient has Advanced planning documents: DNR   Exercise walking                Review of Systems:  Review of Systems  Constitutional: Negative for fever and chills.  HENT: Negative for hearing loss.   Eyes: Positive for blurred vision.       Retinal detachment OS  Respiratory: Negative for shortness of breath.   Cardiovascular: Negative for chest pain, palpitations and leg swelling.  Gastrointestinal: Negative for heartburn, abdominal pain, constipation, blood in stool and melena.  Genitourinary: Negative for dysuria, urgency and frequency.       2-3x at night  Musculoskeletal: Positive for joint pain. Negative for falls.  Skin:       Has dry itchy area right lateral leg; had precancerous area on left ear.    Neurological: Positive for dizziness. Negative for loss of consciousness.       Occasionally if very tired, gets dizzy  Endo/Heme/Allergies: Bruises/bleeds easily.  Psychiatric/Behavioral: Negative for depression and memory loss.       Caregiver stress    Allergies  Allergen Reactions  . Maxitrol [Neomycin-Polymyxin-Dexameth]   . Neosporin [Neomycin-Bacitracin Zn-Polymyx] Itching and Rash    Medications: Patient's Medications  New Prescriptions   No medications on file  Previous Medications   VITAMIN D, ERGOCALCIFEROL, (DRISDOL) 50000 UNITS CAPS CAPSULE    TAKE ONE CAPSULE ONCE A WEEK FOR VITAMIN D SUPPLEMENT.   ZOLPIDEM (AMBIEN) 10 MG TABLET    Take 10 mg by mouth at bedtime as needed for sleep.  Modified Medications   No medications on file  Discontinued Medications   No medications on file     Physical Exam: Filed Vitals:   11/22/14 1431  BP: 104/60  Pulse: 79  Temp: 97.3 F (36.3 C)  TempSrc: Oral  Height: 5' 10.5" (1.791 m)  Weight: 158 lb (71.668 kg)  SpO2: 97%   Body mass index is 22.34 kg/(m^2). Physical Exam  Constitutional: He is  oriented to person, place, and time. He appears well-developed and well-nourished. No distress.  Thin white male  HENT:  Head: Normocephalic and atraumatic.  Right Ear: External ear normal.  Left Ear: External ear normal.  Nose: Nose normal.  Mouth/Throat: Oropharynx is clear and moist. No oropharyngeal exudate.  Eyes: Conjunctivae and EOM are normal. Pupils are equal, round, and reactive to light.  Right eye with dilated vein medially that  has been treated surgically 2x and now stabilized  Neck: Normal range of motion. Neck supple. No JVD present. No tracheal deviation present. No thyromegaly present.  Cardiovascular: Normal rate, regular rhythm, normal heart sounds and intact distal pulses.   Pulmonary/Chest: Effort normal and breath sounds normal. No respiratory distress.  Abdominal: Soft. Bowel sounds are normal. He exhibits no distension and no mass. There is no tenderness.  Musculoskeletal: Normal range of motion. He exhibits no edema or tenderness.  Mild crepitus left shoulder  Neurological: He is alert and oriented to person, place, and time. He has normal reflexes. No cranial nerve deficit.  Skin: Skin is warm and dry.  Left knee abrasion present  Psychiatric: He has a normal mood and affect. His behavior is normal. Judgment and thought content normal.     Labs reviewed: Lab Results  Component Value Date   HGBA1C 5.9 03/09/2012   Patient Care Team: Gayland Curry, DO as PCP - General (Geriatric Medicine) [facility]  Assessment/Plan 1. Insomnia - doing well with this--only uses ambien when next day expected to be stressful--hasn't used in over a month - CBC with Differential/Platelet; Future - Comprehensive metabolic panel; Future  2. Vitamin D deficiency -cont vitamin D supplement and reassess labs - Vitamin D, 25-hydroxy; Future  3. Screening for lipid disorders - previously normal lipids, reassess - Lipid panel; Future  4. Need for  vaccination with 13-polyvalent pneumococcal conjugate vaccine - Pneumococcal conjugate vaccine 13-valent (prevnar) given  Labs/tests ordered:  Cbc, cmp, flp, vit D Next appt:  1 year annual exam  Diondra Pines L. Haliey Romberg, D.O. [facility] [address], [place] [address] Cell Phone (Mon-Fri 8am-5pm):  [phone] On Call:  [phone] & follow prompts after 5pm & weekends Office Phone:  [phone] Office Fax:  [phone]

## 2014-11-22 NOTE — Progress Notes (Signed)
Passed clock drawing 

## 2014-11-28 ENCOUNTER — Other Ambulatory Visit: Payer: Self-pay

## 2014-11-30 ENCOUNTER — Other Ambulatory Visit: Payer: Medicare Other

## 2014-12-05 LAB — HEPATIC FUNCTION PANEL
ALT: 9 U/L — AB (ref 10–40)
AST: 13 U/L — AB (ref 14–40)
Alkaline Phosphatase: 41 U/L (ref 25–125)
Bilirubin, Total: 0.9 mg/dL

## 2014-12-05 LAB — CBC AND DIFFERENTIAL
HCT: 38 % — AB (ref 41–53)
Hemoglobin: 13.1 g/dL — AB (ref 13.5–17.5)
Platelets: 225 10*3/uL (ref 150–399)
WBC: 4.8 10^3/mL

## 2014-12-05 LAB — LIPID PANEL
Cholesterol: 195 mg/dL (ref 0–200)
HDL: 68 mg/dL (ref 35–70)
LDL Cholesterol: 123 mg/dL
LDl/HDL Ratio: 1.8
Triglycerides: 59 mg/dL (ref 40–160)

## 2014-12-05 LAB — BASIC METABOLIC PANEL
BUN: 14 mg/dL (ref 4–21)
Creatinine: 0.9 mg/dL (ref 0.6–1.3)
Glucose: 84 mg/dL
Potassium: 4.3 mmol/L (ref 3.4–5.3)
Sodium: 137 mmol/L (ref 137–147)

## 2014-12-13 ENCOUNTER — Telehealth: Payer: Self-pay

## 2014-12-13 NOTE — Telephone Encounter (Signed)
Left message, got lab results from 12/05/14, will call back tomorrow.

## 2014-12-14 NOTE — Telephone Encounter (Signed)
Left message on voice mail, labs are excellent, no changes, schedule physical for 9/17. Will make copy of labs and mail to patient.

## 2015-06-06 ENCOUNTER — Non-Acute Institutional Stay: Payer: Medicare Other | Admitting: Internal Medicine

## 2015-06-06 ENCOUNTER — Encounter: Payer: Self-pay | Admitting: Internal Medicine

## 2015-06-06 VITALS — BP 98/60 | HR 70 | Temp 97.5°F | Resp 20 | Ht 71.0 in | Wt 159.4 lb

## 2015-06-06 DIAGNOSIS — I959 Hypotension, unspecified: Secondary | ICD-10-CM

## 2015-06-06 DIAGNOSIS — R42 Dizziness and giddiness: Secondary | ICD-10-CM

## 2015-06-06 DIAGNOSIS — G4700 Insomnia, unspecified: Secondary | ICD-10-CM | POA: Diagnosis not present

## 2015-06-06 DIAGNOSIS — F4321 Adjustment disorder with depressed mood: Secondary | ICD-10-CM | POA: Diagnosis not present

## 2015-06-06 DIAGNOSIS — R233 Spontaneous ecchymoses: Secondary | ICD-10-CM

## 2015-06-06 NOTE — Patient Instructions (Signed)
Try a nightly snack at 9/9:30pm.  See if that does not help prevent this dizziness.    Let's check your blood pressure at home daily for 2 weeks.  Notate if you are dizzy.  If it is staying under 100 and you are dizzy, let us know and we can decide what to do next.

## 2015-06-06 NOTE — Progress Notes (Signed)
Patient ID: Alec Weiss, male   DOB: 22-Jun-1926, 82 y.o.   MRN: [id number]   Location: [facility] (12)  Provider: Mccartney Brucks L. Mariea Clonts, D.O., C.M.D.  Code Status: DNR Goals of Care:  Advanced Directives 06/06/2015  Does patient have an advance directive? Yes  Type of Advance Directive Out of facility DNR (pink MOST or yellow form)  Does patient want to make changes to advanced directive? No - Patient declined  Copy of advanced directive(s) in chart? Yes  Pre-existing out of facility DNR order (yellow form or pink MOST form) -     Chief Complaint  Patient presents with  . Medical Management of Chronic Issues    blood blister on left knee area    HPI: Patient is a 82 y.o. male seen today for an acute visit for a blood blister on his left knee.   He thinks it started about 2 weeks ago.  Last week, it was more bloated appearing.  Started as a normal bruise and then began creeping down his leg.  Bernadette sent him here.    Also sometimes feels sort of dizzy.  Occasionally, not consistently, and not often, he feels a little dizzy when he wakes up.  Feels like the floor is moving away from him.  Coffee and breakfast seem to help.  Sits up and waits a few minutes. Does not jump up.  No blurry vision.  Has had some ringing in the ears--comes if it's very quiet or he's very tired.  Seems it will come for 2-3 days, then ok for a while.  Very seldom takes his [place].  No correlation of that with his symptoms.  Does not have bp meter.  Has not changed his routine otherwise.  No pressure in head or congestion.   Eats a regular meal around 6-7pm with a glass or two of wine.  Might have yogurt and fruit or a graham cracker or two later on.   Past Medical History  Diagnosis Date  . 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 Actinic keratosis 04/05/2012  . Vitamin D deficiency 08/04/2012  . Pain in limb 02/05/2011  . Shortness of breath 09/07/2008  .  Insomnia, unspecified 06/08/2008  . Lumbago 01/02/2003  . Retinal detachment 2000    left eye  . Screening cholesterol level     Past Surgical History  Procedure Laterality Date  . Retinal detachment surgery Left [place] lesion excision  2009  . External ear surgery  05/2012    left ear   . Inguinal hernia repair Right 01/21/2005    Dr. [name]  . Mohs surgery Left 05/28/2012    ear lobe Dr. [name]    Allergies  Allergen Reactions  . Maxitrol [Neomycin-Polymyxin-Dexameth]   . Neosporin [Neomycin-Bacitracin Zn-Polymyx] Itching and Rash      Medication List       This list is accurate as of: 06/06/15 11:18 AM.  Always use your most recent med list.               Vitamin D (Ergocalciferol) 50000 units Caps capsule  Commonly known as:  DRISDOL  TAKE ONE CAPSULE ONCE A WEEK FOR VITAMIN D SUPPLEMENT.     zolpidem 10 MG tablet  Commonly known as:  AMBIEN  Take 10 mg by mouth at bedtime as needed for sleep.        Review of Systems:  Review of Systems  Constitutional: Negative for fever, activity change, appetite change, fatigue and unexpected weight change.  HENT: Positive for tinnitus. Negative for congestion and sinus pressure.   Eyes: Negative for visual disturbance.  Respiratory: Negative for cough and shortness of breath.   Cardiovascular: Negative for chest pain and leg swelling.  Gastrointestinal: Negative for abdominal pain.  Neurological: Positive for dizziness. Negative for weakness, light-headedness and headaches.  Hematological: Bruises/bleeds easily.       Bruise left lateral knee  Psychiatric/Behavioral:       Occasional insomnia when has stressful day ahead    Health Maintenance  Topic Date Due  . TETANUS/TDAP  04/08/1947  . INFLUENZA VACCINE  10/02/2015  . PNA vac Low Risk Adult (2 of 2 - PPSV23) 11/22/2015  . ZOSTAVAX  Completed    Physical Exam: Filed Vitals:   06/06/15 1108  BP: 98/60  Pulse: 70  Temp: 97.5 F (36.4  C)  TempSrc: Oral  Resp: 20  Height: 5\' 11"  (1.803 m)  Weight: 159 lb 6.4 oz (72.303 kg)  SpO2: 99%   Body mass index is 22.24 kg/(m^2). Physical Exam  Constitutional: He is oriented to person, place, and time. He appears well-developed and well-nourished. No distress.  Cardiovascular: Normal rate, regular rhythm, normal heart sounds and intact distal pulses.   Pulmonary/Chest: Effort normal and breath sounds normal. No respiratory distress.  Musculoskeletal: Normal range of motion.  Neurological: He is alert and oriented to person, place, and time.  Skin: Skin is warm and dry.  Approximately 1 inch vertically and 3/4 inch wide ecchymotic area on left lateral knee, nontender, is raised centrally  Psychiatric: He has a normal mood and affect.    Labs reviewed: Basic Metabolic Panel:  Recent Labs  12/05/14  NA 137  K 4.3  BUN 14  CREATININE 0.9   Liver Function Tests:  Recent Labs  12/05/14  AST 13*  ALT 9*  ALKPHOS 41   No results for input(s): LIPASE, AMYLASE in the last 8760 hours. No results for input(s): AMMONIA in the last 8760 hours. CBC:  Recent Labs  12/05/14  WBC 4.8  HGB 13.1*  HCT 38*  PLT 225   Lipid Panel:  Recent Labs  12/05/14  CHOL 195  HDL 68  LDLCALC 123  TRIG 59   Lab Results  Component Value Date   HGBA1C 5.9 03/09/2012    Assessment/Plan 1. Dizziness -episodic -unclear etiology -leaning toward hypotension as bp particularly low today -other possibility is hypoglycemia (he fits this with his thin stature)--advised to try a night time snack each night and see if he does not have the dizziness anymore -also given Rx for a bp wrist cuff to check pressure at home each am and report if staying under [id number] systolic and symptomatic -also Mliss Sax asked him to come weekly to her for bp checks  2. Hypotension, unspecified -see #1 -he was not dizzy today  3. Insomnia -cont prn ambien--rarely uses and not correlated with  dizziness  4. Spontaneous ecchymoses -left lateral knee--nonpainful, no clear injury--suspect rupture capillary -advised it is normal for this to move downward on his leg -should resolve after several weeks due to large size  5. Grief -seems to be coping ok--continues to participate in activities, committees, work at [employer] (has more time now) since his wife has passed away -does not request any medications or additional counseling  Labs/tests ordered:  Keep as scheduled Next appt:  11/28/2015 for CPE  Leigh Kaeding L. Mariea Clonts,  D.O. [facility] [id number], [place] [address] Cell Phone (Mon-Fri 8am-5pm):  [phone] On Call:  [phone] & follow prompts after 5pm & weekends Office Phone:  [phone] Office Fax:  [phone]

## 2015-06-07 ENCOUNTER — Encounter: Payer: Self-pay | Admitting: Internal Medicine

## 2015-06-07 ENCOUNTER — Other Ambulatory Visit: Payer: Self-pay | Admitting: Internal Medicine

## 2015-10-12 ENCOUNTER — Telehealth: Payer: Self-pay | Admitting: *Deleted

## 2015-10-12 MED ORDER — MUPIROCIN 2 % EX OINT: 1.0000 "application " | TOPICAL_OINTMENT | Freq: Two times a day (BID) | CUTANEOUS | 0 refills | Status: DC

## 2015-10-12 NOTE — Telephone Encounter (Signed)
Ok to send bactroban for his foot twice daily and cover with dry dressing until healed.

## 2015-10-12 NOTE — Telephone Encounter (Signed)
Bernadette calling from [facility] stating that pt has an open sore on his foot that's been there for a week and was asking for Bactroban to be sent in to the pharmacy, please advise

## 2015-10-12 NOTE — Telephone Encounter (Signed)
Spoke with [facility] and advised results per Dr. Mariea Clonts rx sent to pharmacy by e-script

## 2015-11-28 ENCOUNTER — Non-Acute Institutional Stay: Payer: Medicare Other | Admitting: Internal Medicine

## 2015-11-28 ENCOUNTER — Encounter: Payer: Self-pay | Admitting: Internal Medicine

## 2015-11-28 VITALS — BP 110/60 | HR 72 | Temp 97.5°F | Ht 71.0 in | Wt 165.0 lb

## 2015-11-28 DIAGNOSIS — M25512 Pain in left shoulder: Secondary | ICD-10-CM

## 2015-11-28 DIAGNOSIS — G4700 Insomnia, unspecified: Secondary | ICD-10-CM

## 2015-11-28 DIAGNOSIS — H3322 Serous retinal detachment, left eye: Secondary | ICD-10-CM

## 2015-11-28 DIAGNOSIS — I358 Other nonrheumatic aortic valve disorders: Secondary | ICD-10-CM

## 2015-11-28 DIAGNOSIS — I781 Nevus, non-neoplastic: Secondary | ICD-10-CM | POA: Diagnosis not present

## 2015-11-28 DIAGNOSIS — Z13220 Encounter for screening for lipoid disorders: Secondary | ICD-10-CM | POA: Diagnosis not present

## 2015-11-28 DIAGNOSIS — H3320 Serous retinal detachment, unspecified eye: Secondary | ICD-10-CM | POA: Insufficient documentation

## 2015-11-28 DIAGNOSIS — H3570 Unspecified separation of retinal layers: Secondary | ICD-10-CM

## 2015-11-28 DIAGNOSIS — E559 Vitamin D deficiency, unspecified: Secondary | ICD-10-CM | POA: Diagnosis not present

## 2015-11-28 DIAGNOSIS — Z0000 Encounter for general adult medical examination without abnormal findings: Secondary | ICD-10-CM | POA: Diagnosis not present

## 2015-11-28 NOTE — Progress Notes (Signed)
Location:  Occupational psychologist of Service:  Clinic (12) Provider: Geniya Fulgham L. Mariea Clonts, D.O., C.M.D.  Patient Care Team: Gayland Curry, DO as PCP - General (Geriatric Medicine) [facility] Monna Fam, MD as Consulting Physician (Ophthalmology)  Extended Emergency Contact Information Primary Emergency Contact: [place], [place] of [place] Phone: [phone] Relation: Daughter Secondary Emergency Contact: Zalesky,Ellen Address: [address]          [place] [address] Johnnette Litter of [place] Phone: [phone] Relation: Spouse  Code Status: DNR Goals of Care: Advanced Directive information Advanced Directives 11/28/2015  Does patient have an advance directive? Yes  Type of Advance Directive Out of facility DNR (pink MOST or yellow form)  Does patient want to make changes to advanced directive? -  Copy of advanced directive(s) in chart? Yes  Pre-existing out of facility DNR order (yellow form or pink MOST form) Yellow form placed in chart (order not valid for inpatient use)     Chief Complaint  Patient presents with  . Medicare Wellness  . Annual Exam  . MMSE    20/30 passed clock    HPI: Patient is a 82 y.o. male seen in today for an annual wellness exam.    Left shoulder is painful if he moves it around much.  Does not use medication for it.  Has had to quit sleeping on his left side.  Depression screen [facility] 2/9 11/28/2015 11/22/2014 09/27/2013 09/12/2013  Decreased Interest 0 0 0 0  Down, Depressed, Hopeless 0 0 0 -  PHQ - 2 Score 0 0 0 0    Fall Risk  11/28/2015 06/06/2015 11/22/2014 09/12/2013  Falls in the past year? No No No No   MMSE - Mini Mental State Exam 11/28/2015 11/22/2014  Orientation to time 4 5  Orientation to Place 5 5  Registration 3 3  Attention/ Calculation 5 5  Recall 3 3  Language- name 2 objects 2 2  Language- repeat 1 1  Language- follow 3 step command 3 3  Language- read &  follow direction 1 1  Write a sentence 1 1  Copy design 1 1  Total score 29 30     Health Maintenance  Topic Date Due  . TETANUS/TDAP  04/08/1945  . INFLUENZA VACCINE  10/02/2015  . PNA vac Low Risk Adult (2 of 2 - PPSV23) 11/22/2015  . ZOSTAVAX  Completed    Functional Status Survey:        Diet? Vision Screening Comments: Dr. Baldemar Lenis Hearing: Dentition: Pain:  Past Medical History:  Diagnosis Date  . Actinic keratosis 04/05/2012  . Insomnia, unspecified 06/08/2008  . Lumbago 01/02/2003  . 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 Pain in limb 02/05/2011  . Retinal detachment 2000   left eye  . Screening cholesterol level   . Shortness of breath 09/07/2008  . Vitamin D deficiency 08/04/2012    Past Surgical History:  Procedure Laterality Date  . EXTERNAL EAR SURGERY  05/2012   left ear   . INGUINAL HERNIA REPAIR Right 01/21/2005   Dr. [name]  . LID LESION EXCISION  2009  . MOHS SURGERY Left 05/28/2012   ear lobe Dr. [name]  . RETINAL DETACHMENT SURGERY Left [facility]    Family History  Problem Relation Age of Onset  . Cancer Mother     breast    Social History   Social History  . Marital status: Married  Spouse name: N/A  . Number of children: N/A  . Years of education: N/A   Occupational History  . retired Hydrologist professor    Social History Main Topics  . Smoking status: Never Smoker  . Smokeless tobacco: Never Used  . Alcohol use 1.2 oz/week    2 Glasses of wine per week     Comment: at dinner  . Drug use: No  . Sexual activity: Not Asked   Other Topics Concern  . None   Social History Narrative   Patient is  Married [name]) since 1958. Retired professor, Hydrologist. Lves in apartment, Independent Living section at [facility] since 02/2012.    He is primary caregiver for spouse with dementia.   No Smoking history, drinks moderate amount of alcohol    Patient has Advanced planning documents: DNR    Exercise walking                reports that he has never smoked. He has never used smokeless tobacco. He reports that he drinks about 1.2 oz of alcohol per week . He reports that he does not use drugs.    Medication List       Accurate as of 11/28/15  2:56 PM. Always use your most recent med list.          Vitamin D (Ergocalciferol) 50000 units Caps capsule Commonly known as:  DRISDOL TAKE ONE CAPSULE ONCE A WEEK FOR VITAMIN D SUPPLEMENT.      Review of Systems:  Review of Systems  Constitutional: Negative for chills, fever, malaise/fatigue and weight loss.  HENT: Positive for hearing loss. Negative for congestion.   Eyes: Negative for blurred vision.       Glasses, blue nevus vs blood vessel right medial canthus  Respiratory: Negative for cough and shortness of breath.   Cardiovascular: Negative for chest pain, palpitations, orthopnea, leg swelling and PND.  Gastrointestinal: Negative for abdominal pain, blood in stool, constipation and melena.  Genitourinary: Negative for dysuria, frequency and urgency.       2x nocturia  Musculoskeletal: Positive for joint pain and neck pain. Negative for back pain, falls and myalgias.       Left shoulder  Skin: Negative for itching and rash.  Neurological: Negative for dizziness, loss of consciousness and weakness.       Dizziness he was having last visit resolved with intake of more fruit juice  Endo/Heme/Allergies: Bruises/bleeds easily.  Psychiatric/Behavioral: Negative for depression and memory loss. The patient is not nervous/anxious and does not have insomnia.     Physical Exam: Vitals:   11/28/15 1430  BP: 110/60  Pulse: 72  Temp: 97.5 F (36.4 C)  TempSrc: Oral  SpO2: 95%  Weight: 165 lb (74.8 kg)  Height: 5\' 11"  (1.803 m)   Body mass index is 23.01 kg/m. Physical Exam  Constitutional: He is oriented to person, place, and time. He appears well-developed and well-nourished. No distress.  HENT:  Head:  Normocephalic and atraumatic.  Right Ear: External ear normal.  Left Ear: External ear normal.  Nose: Nose normal.  Mouth/Throat: Oropharynx is clear and moist. No oropharyngeal exudate.  Moist cerumen present today  Eyes: Conjunctivae and EOM are normal. Pupils are equal, round, and reactive to light.  Neck: Normal range of motion. Neck supple. No JVD present. No thyromegaly present.  Cardiovascular: Normal rate, regular rhythm and intact distal pulses.   Murmur heard. Noted in aortic area, systolic  Pulmonary/Chest: Effort normal and breath sounds normal.  No respiratory distress.  Abdominal: Soft. Bowel sounds are normal. He exhibits no distension and no mass. There is no tenderness. There is no rebound and no guarding. No hernia.  Musculoskeletal: Normal range of motion. He exhibits no edema or tenderness.  Lymphadenopathy:    He has no cervical adenopathy.  Neurological: He is alert and oriented to person, place, and time. He has normal reflexes. No cranial nerve deficit.  Skin: Skin is warm and dry. Capillary refill takes less than 2 seconds.  Nevus vs blood vessel right medial canthus; thigh wound healed from several months ago  Psychiatric: He has a normal mood and affect. His behavior is normal. Judgment and thought content normal.    Labs reviewed: Basic Metabolic Panel:  Recent Labs  12/05/14  NA 137  K 4.3  BUN 14  CREATININE 0.9   Liver Function Tests:  Recent Labs  12/05/14  AST 13*  ALT 9*  ALKPHOS 41   No results for input(s): LIPASE, AMYLASE in the last 8760 hours. No results for input(s): AMMONIA in the last 8760 hours. CBC:  Recent Labs  12/05/14  WBC 4.8  HGB 13.1*  HCT 38*  PLT 225   Lipid Panel:  Recent Labs  12/05/14  CHOL 195  HDL 68  LDLCALC 123  TRIG 59   Lab Results  Component Value Date   HGBA1C 5.9 03/09/2012   Assessment/Plan 1. Medicare annual wellness visit, subsequent -he is checking on when he had pneumovax, also need  tdap date, otherwise up to date and will get flu shot at [facility] flu clinic  2. Vitamin D deficiency -cont vitamin D supplement  3. Nevus, non-neoplastic -right medial canthus, monitor and f/u with ophtho  4. Detached retina, left -f/u with ophtho  5. Aortic systolic murmur on examination -I did not take note of this before -monitor, consider echo  6. Need for lipid screening -flp next week  7. Pain in joint of left shoulder -when he abducts his arm it radiates from his trapezius down his upper arm -not bad enough to take pain medication and resolves with positional change  8. Insomnia -rarely uses ambien--none lately b/c of decreased stress levels  Labs/tests ordered:  Cbc, cmp, flp, 25-OH Vitamin D  Next appt:  1 year  Gordan Grell L. Danisha Brassfield, D.O. [facility] [address], [place] [address] Cell Phone (Mon-Fri 8am-5pm):  [phone] On Call:  [phone] & follow prompts after 5pm & weekends Office Phone:  [phone] Office Fax:  [phone]

## 2015-12-04 ENCOUNTER — Encounter: Payer: Self-pay | Admitting: Internal Medicine

## 2015-12-04 LAB — BASIC METABOLIC PANEL
BUN: 15 mg/dL (ref 4–21)
Creatinine: 0.9 mg/dL (ref 0.6–1.3)
Glucose: 93 mg/dL
Potassium: 4.7 mmol/L (ref 3.4–5.3)
Sodium: 140 mmol/L (ref 137–147)

## 2015-12-04 LAB — HEPATIC FUNCTION PANEL
ALT: 15 U/L (ref 10–40)
AST: 16 U/L (ref 14–40)
Alkaline Phosphatase: 45 U/L (ref 25–125)
Bilirubin, Total: 0.5 mg/dL

## 2015-12-04 LAB — LIPID PANEL
Cholesterol: 212 mg/dL — AB (ref 0–200)
HDL: 90 mg/dL — AB (ref 35–70)
LDL Cholesterol: 111 mg/dL
Triglycerides: 58 mg/dL (ref 40–160)

## 2015-12-04 LAB — CBC AND DIFFERENTIAL
HCT: 40 % — AB (ref 41–53)
Hemoglobin: 12.6 g/dL — AB (ref 13.5–17.5)
Platelets: 250 10*3/uL (ref 150–399)
WBC: 4.7 10^3/mL

## 2015-12-07 ENCOUNTER — Encounter: Payer: Self-pay | Admitting: Internal Medicine

## 2016-05-13 ENCOUNTER — Encounter (HOSPITAL_COMMUNITY): Payer: Self-pay | Admitting: Emergency Medicine

## 2016-05-13 ENCOUNTER — Emergency Department (HOSPITAL_COMMUNITY)
Admission: EM | Admit: 2016-05-13 | Discharge: 2016-05-13 | Disposition: A | Discharge status: Home / Self Care | Payer: Medicare Other | Attending: Emergency Medicine | Admitting: Emergency Medicine

## 2016-05-13 DIAGNOSIS — Y929 Unspecified place or not applicable: Secondary | ICD-10-CM | POA: Insufficient documentation

## 2016-05-13 DIAGNOSIS — W000XXA Fall on same level due to ice and snow, initial encounter: Secondary | ICD-10-CM | POA: Diagnosis not present

## 2016-05-13 DIAGNOSIS — Y9301 Activity, walking, marching and hiking: Secondary | ICD-10-CM | POA: Insufficient documentation

## 2016-05-13 DIAGNOSIS — S0181XA Laceration without foreign body of other part of head, initial encounter: Secondary | ICD-10-CM

## 2016-05-13 DIAGNOSIS — Y999 Unspecified external cause status: Secondary | ICD-10-CM | POA: Insufficient documentation

## 2016-05-13 MED ORDER — SODIUM BICARBONATE 4 % IV SOLN
5.0000 mL | Freq: Once | INTRAVENOUS | Status: DC
  Filled 2016-05-13: qty 5

## 2016-05-13 MED ORDER — LIDOCAINE-EPINEPHRINE (PF) 2 %-1:200000 IJ SOLN
10.0000 mL | Freq: Once | INTRAMUSCULAR | Status: AC
  Administered 2016-05-13: 10 mL
  Filled 2016-05-13: qty 20

## 2016-05-13 NOTE — ED Provider Notes (Signed)
[facility] DEPT Provider Note   CSN: [id number] Arrival date & time: 05/13/16  1045     History   Chief Complaint Chief Complaint  Patient presents with  . Fall  . Head Injury    HPI Alec Weiss is a 83 y.o. male.  HPI Patient presents to the emergency room for evaluation of forehead laceration. The patient was walking outside when he slipped on the ice and fell. He struck his forehead on the ground. The patient was evaluated the nurse at his living facility. They applied bandages and sent him to the emergency room for suturing. Patient denies any loss of consciousness. He does not have any headache. He does not take any blood thinning medications. patient has been able to walk around without difficulty Past Medical History:  Diagnosis Date  . Actinic keratosis 04/05/2012  . Insomnia, unspecified 06/08/2008  . Lumbago 01/02/2003  . 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 Pain in limb 02/05/2011  . Retinal detachment 2000   left eye  . Screening cholesterol level   . Shortness of breath 09/07/2008  . Vitamin D deficiency 08/04/2012    Patient Active Problem List   Diagnosis Date Noted  . 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 Detached retina 11/28/2015  . Aortic systolic murmur on examination 11/28/2015  . Insomnia 03/24/2013  . Pain in joint, shoulder region 09/13/2012  . Vitamin D deficiency 08/04/2012  . Actinic keratosis 04/05/2012    Past Surgical History:  Procedure Laterality Date  . EXTERNAL EAR SURGERY  05/2012   left ear   . INGUINAL HERNIA REPAIR Right 01/21/2005   Dr. [name]  . LID LESION EXCISION  2009  . MOHS SURGERY Left 05/28/2012   ear lobe Dr. [name]  . RETINAL DETACHMENT SURGERY Left [facility] Medications    Prior to Admission medications   Medication Sig Start Date End Date Taking? Authorizing Provider  Vitamin D, Ergocalciferol, (DRISDOL) 50000 units CAPS capsule Take 50,000 Units by mouth every Tuesday.    Yes Historical Provider, MD    Family History Family History  Problem Relation Age of Onset  . Cancer Mother     breast    Social History Social History  Substance Use Topics  . Smoking status: Never Smoker  . Smokeless tobacco: Never Used  . Alcohol use 1.2 oz/week    2 Glasses of wine per week     Comment: at dinner     Allergies   Maxitrol [neomycin-polymyxin-dexameth] and Neosporin [neomycin-bacitracin zn-polymyx]   Review of Systems Review of Systems  All other systems reviewed and are negative.    Physical Exam Updated Vital Signs BP 134/80 (BP Location: Left Arm)   Pulse 78   Temp 97.6 F (36.4 C) (Oral)   Resp 17   SpO2 99%   Physical Exam  Constitutional: He appears well-developed and well-nourished. No distress.  HENT:  Head: Normocephalic.  Right Ear: External ear normal.  Left Ear: External ear normal.  Irregular laceration of the midforehead  Eyes: Conjunctivae are normal. Right eye exhibits no discharge. Left eye exhibits no discharge. No scleral icterus.  Neck: Neck supple. No tracheal deviation present.  Cardiovascular: Normal rate.   Pulmonary/Chest: Effort normal. No stridor. No respiratory distress.  Abdominal: He exhibits no distension.  Musculoskeletal: He exhibits no edema.  No spinal tenderness, small contusions on the patient's knees, he denies any tenderness palpation  Neurological: He is alert. Cranial nerve  deficit: no gross deficits.  Skin: Skin is warm and dry. No rash noted.  Psychiatric: He has a normal mood and affect.  Nursing note and vitals reviewed.    ED Treatments / Results    Procedures Procedures (including critical care time)  Medications Ordered in ED Medications  sodium bicarbonate (NEUT) 4 % injection 5 mL (5 mLs Subcutaneous Not Given 05/13/16 1244)  lidocaine-EPINEPHrine (XYLOCAINE W/EPI) 2 %-1:200000 (PF) injection 10 mL (10 mLs Infiltration Given by Other 05/13/16 1242)     Initial Impression /  Assessment and Plan / ED Course  I have reviewed the triage vital signs and the nursing notes.  Pertinent labs & imaging results that were available during my care of the patient were reviewed by me and considered in my medical decision making (see chart for details). Patient has no evidence to suggest any significant intracranial injury. He has no neck tenderness or any other neurologic symptoms.    patient's laceration was  repaired by PA Browning.  At this time there does not appear to be any evidence of an acute emergency medical condition and the patient appears stable for discharge with appropriate outpatient follow up.  Final Clinical Impressions(s) / ED Diagnoses   Final diagnoses:  Laceration of forehead, initial encounter    New Prescriptions New Prescriptions   No medications on file     Dorie Rank, MD 05/13/16 1342

## 2016-05-13 NOTE — ED Provider Notes (Signed)
LACERATION REPAIR Performed by: Montine Circle Authorized by: Montine Circle Consent: Verbal consent obtained. Risks and benefits: risks, benefits and alternatives were discussed Consent given by: patient Patient identity confirmed: provided demographic data Prepped and Draped in normal sterile fashion Wound explored  Laceration Location: Forehead  Laceration Length: 6 cm  No Foreign Bodies seen or palpated  Anesthesia: local infiltration  Local anesthetic: lidocaine 1% with epinephrine  Anesthetic total: 5 ml  Irrigation method: syringe Amount of cleaning: standard  Skin closure: 5-0 vicryl rapide  Number of sutures: 11  Technique: interrupted  Patient tolerance: Patient tolerated the procedure well with no immediate complications.    Montine Circle, PA-C 05/13/16 1316

## 2016-05-13 NOTE — Discharge Instructions (Signed)
Absorbable sutures were used, applying antibiotic ointment to the wound daily, return to the emergency room for severe headache ,confusion or other concerning symptoms

## 2016-05-13 NOTE — ED Triage Notes (Signed)
Patient was walking outside when he slipped on ice and fell in snow.  Patient denies any LOC or taking any blood thinners.  Patient resides at [facility] and nurse there applied bandage to patients forehead.

## 2016-06-23 ENCOUNTER — Other Ambulatory Visit: Payer: Self-pay | Admitting: Internal Medicine

## 2016-07-24 ENCOUNTER — Encounter: Payer: Self-pay | Admitting: Internal Medicine

## 2016-09-22 ENCOUNTER — Encounter: Payer: Self-pay | Admitting: Internal Medicine

## 2016-11-10 ENCOUNTER — Encounter: Payer: Self-pay | Admitting: Internal Medicine

## 2016-11-24 ENCOUNTER — Encounter (INDEPENDENT_AMBULATORY_CARE_PROVIDER_SITE_OTHER): Payer: Medicare Other | Admitting: Ophthalmology

## 2016-12-02 ENCOUNTER — Encounter (INDEPENDENT_AMBULATORY_CARE_PROVIDER_SITE_OTHER): Payer: Medicare Other | Admitting: Ophthalmology

## 2016-12-02 DIAGNOSIS — H338 Other retinal detachments: Secondary | ICD-10-CM | POA: Diagnosis not present

## 2016-12-02 DIAGNOSIS — H43813 Vitreous degeneration, bilateral: Secondary | ICD-10-CM

## 2016-12-02 DIAGNOSIS — H4423 Degenerative myopia, bilateral: Secondary | ICD-10-CM

## 2016-12-03 ENCOUNTER — Encounter: Payer: Self-pay | Admitting: Internal Medicine

## 2016-12-03 ENCOUNTER — Non-Acute Institutional Stay: Payer: Medicare Other | Admitting: Internal Medicine

## 2016-12-03 VITALS — BP 128/70 | HR 68 | Temp 97.6°F | Ht 71.0 in | Wt 158.0 lb

## 2016-12-03 DIAGNOSIS — E559 Vitamin D deficiency, unspecified: Secondary | ICD-10-CM | POA: Diagnosis not present

## 2016-12-03 DIAGNOSIS — Z0000 Encounter for general adult medical examination without abnormal findings: Secondary | ICD-10-CM

## 2016-12-03 DIAGNOSIS — Z23 Encounter for immunization: Secondary | ICD-10-CM

## 2016-12-03 DIAGNOSIS — D649 Anemia, unspecified: Secondary | ICD-10-CM

## 2016-12-03 DIAGNOSIS — I358 Other nonrheumatic aortic valve disorders: Secondary | ICD-10-CM

## 2016-12-03 MED ORDER — VITAMIN D (ERGOCALCIFEROL) 1.25 MG (50000 UNIT) PO CAPS: 50000.0000 [IU] | ORAL_CAPSULE | ORAL | 0 refills | Status: DC

## 2016-12-03 NOTE — Progress Notes (Signed)
Location:  Occupational psychologist of Service:  Clinic (12) Provider: Onie Hayashi L. Mariea Clonts, D.O., C.M.D.  Patient Care Team: Gayland Curry, DO as PCP - General (Geriatric Medicine) [facility] [name], MD as Consulting Physician (Ophthalmology)  Extended Emergency Contact Information Primary Emergency Contact: [place] of [place] Phone: [phone] Relation: Daughter  Code Status: DNR, MOST updated today as below--20 mins spent on MOST advance care planning  Goals of Care: Advanced Directive information Advanced Directives 12/03/2016  Does Patient Have a Medical Advance Directive? Yes  Type of Advance Directive Living will;Out of facility DNR (pink MOST or yellow form)  Does patient want to make changes to medical advance directive? -  Copy of [place] in Chart? -  Pre-existing out of facility DNR order (yellow form or pink MOST form) Yellow form placed in chart (order not valid for inpatient use)   Chief Complaint  Patient presents with  . Annual Exam    annual physical  . Medicare Wellness    medicare wellness    HPI: Patient is a 83 y.o. male seen in today for an annual physical exam and annual wellness exam.   Was not faithfully taking vitamin D b/c he didn't know to call his pharmacy and ask them to send [place] a refill request or to call the office when he ran out.  Last labs were in October of 2017.    Had second cataract surgery a week ago.  In need of proper spectacles.  They have been quite successful.  Sees much better at night.  He's gone from nearsighted to farsighted.    MOST review.   DNR.  Limited interventions.  abx and fluids case by case, short term.  No tube feedings.  Reviewed with him today and new MOST done as previous original is MIA.    Depression screen [facility] 2/9 12/03/2016 11/28/2015 11/22/2014 09/27/2013 09/12/2013  Decreased Interest 0 0 0 0 0  Down, Depressed,  Hopeless 0 0 0 0 -  PHQ - 2 Score 0 0 0 0 0    Fall Risk  12/03/2016 11/28/2015 06/06/2015 11/22/2014 09/12/2013  Falls in the past year? No No No No No   MMSE - Mini Mental State Exam 12/03/2016 11/28/2015 11/22/2014  Orientation to time 5 4 5   Orientation to Place 5 5 5   Registration 3 3 3   Attention/ Calculation 5 5 5   Recall 2 3 3   Language- name 2 objects 2 2 2   Language- repeat 1 1 1   Language- follow 3 step command 3 3 3   Language- read & follow direction 1 1 1   Write a sentence 1 1 1   Copy design 1 1 1   Total score 29 29 30   Passed clock.    Health Maintenance  Topic Date Due  . TETANUS/TDAP  04/08/1945  . PNA vac Low Risk Adult (2 of 2 - PPSV23) 11/22/2015  . INFLUENZA VACCINE  10/01/2016   Functional Status Survey:        Diet? No exam data present Hearing: Dentition: Pain:  Past Medical History:  Diagnosis Date  . Actinic keratosis 04/05/2012  . Insomnia, unspecified 06/08/2008  . Lumbago 01/02/2003  . 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 Pain in limb 02/05/2011  . Retinal detachment 2000   left eye  . Screening cholesterol level   . Shortness of breath 09/07/2008  . Vitamin D deficiency 08/04/2012    Past  Surgical History:  Procedure Laterality Date  . EXTERNAL EAR SURGERY  05/2012   left ear   . INGUINAL HERNIA REPAIR Right 01/21/2005   Dr. [name]  . LID LESION EXCISION  2009  . MOHS SURGERY Left 05/28/2012   ear lobe Dr. [name]  . RETINAL DETACHMENT SURGERY Left [facility]    Family History  Problem Relation Age of Onset  . Cancer Mother        breast    Social History   Social History  . Marital status: Married    Spouse name: N/A  . Number of children: N/A  . Years of education: N/A   Occupational History  . retired Hydrologist professor    Social History Main Topics  . Smoking status: Never Smoker  . Smokeless tobacco: Never Used  . Alcohol use 1.2 oz/week    2 Glasses of wine per week     Comment: at dinner  . Drug  use: No  . Sexual activity: Not Asked   Other Topics Concern  . None   Social History Narrative   Patient is  Married Alec Weiss) since 1958. Retired professor, Hydrologist. Lves in apartment, Independent Living section at [facility] since 02/2012.    He is primary caregiver for spouse with dementia.   No Smoking history, drinks moderate amount of alcohol    Patient has Advanced planning documents: DNR   Exercise walking                reports that he has never smoked. He has never used smokeless tobacco. He reports that he drinks about 1.2 oz of alcohol per week . He reports that he does not use drugs.  Outpatient Encounter Prescriptions as of 12/03/2016  Medication Sig  . Vitamin D, Ergocalciferol, (DRISDOL) 50000 units CAPS capsule Take 1 capsule (50,000 Units total) by mouth every 7 (seven) days.  . [DISCONTINUED] Vitamin D, Ergocalciferol, (DRISDOL) 50000 units CAPS capsule TAKE ONE CAPSULE ONCE A WEEK FOR VITAMIN D SUPPLEMENT.   No facility-administered encounter medications on file as of 12/03/2016.    Review of Systems:  Review of Systems  Constitutional: Negative for chills, fever and malaise/fatigue.  HENT: Negative for congestion and hearing loss.   Eyes: Negative for blurred vision.       S/p cataract surgery  Respiratory: Negative for cough and shortness of breath.   Cardiovascular: Negative for chest pain, palpitations and leg swelling.  Gastrointestinal: Negative for abdominal pain, blood in stool, constipation and melena.  Genitourinary: Negative for dysuria.  Musculoskeletal: Negative for back pain, falls, joint pain, myalgias and neck pain.  Skin: Positive for itching and rash.       Left lower neck  Neurological: Negative for weakness.  Psychiatric/Behavioral: Negative for depression.    Physical Exam: Vitals:   12/03/16 1337  BP: 128/70  Pulse: 68  Temp: 97.6 F (36.4 C)  TempSrc: Oral  SpO2: 97%  Weight: 158 lb (71.7 kg)  Height: 5'  11" (1.803 m)   Body mass index is 22.04 kg/m. Physical Exam  Constitutional: He is oriented to person, place, and time. He appears well-developed and well-nourished. No distress.  HENT:  Head: Normocephalic and atraumatic.  Right Ear: External ear normal.  Left Ear: External ear normal.  Mouth/Throat: Oropharynx is clear and moist.  Right ear with increased cerumen  Eyes: Pupils are equal, round, and reactive to light. Conjunctivae and EOM are normal.  Neck: Normal range of motion. Neck supple.  No JVD present.  Cardiovascular: Normal rate, regular rhythm and intact distal pulses.   Murmur heard. 2/6 systolic murmur loudest at right second ICS  Pulmonary/Chest: Effort normal and breath sounds normal. No respiratory distress.  Abdominal: Soft. Bowel sounds are normal. He exhibits no distension. There is no tenderness.  Musculoskeletal: Normal range of motion. He exhibits no tenderness.  Lymphadenopathy:    He has no cervical adenopathy.  Neurological: He is alert and oriented to person, place, and time. No cranial nerve deficit.  Skin: Skin is warm and dry. Capillary refill takes less than 2 seconds. Rash noted.  On left side of neck  Psychiatric: He has a normal mood and affect. His behavior is normal. Judgment and thought content normal.    Labs reviewed: Basic Metabolic Panel: No results for input(s): NA, K, CL, CO2, GLUCOSE, BUN, CREATININE, CALCIUM, MG, PHOS, TSH in the last 8760 hours. Liver Function Tests: No results for input(s): AST, ALT, ALKPHOS, BILITOT, PROT, ALBUMIN in the last 8760 hours. No results for input(s): LIPASE, AMYLASE in the last 8760 hours. No results for input(s): AMMONIA in the last 8760 hours. CBC: No results for input(s): WBC, NEUTROABS, HGB, HCT, MCV, PLT in the last 8760 hours. Lipid Panel: No results for input(s): CHOL, HDL, LDLCALC, TRIG, CHOLHDL, LDLDIRECT in the last 8760 hours. Lab Results  Component Value Date   HGBA1C 5.9 03/09/2012    Assessment/Plan 1. Annual physical exam -performed above -recommended derm annual visit -needs annual labs, echo, doing great otherwise  2. Medicare annual wellness visit, subsequent -performed today as above, doing great  3. Vitamin D deficiency -f/u 66 [place] Vitamin D with labs, cont weekly D--year's supply sent to pharmacy  4. Anemia, unspecified type -f/u cbc with diff, cmp  5. Aortic systolic murmur on examination - Ambulatory referral to Cardiology for echo to evaluate more thoroughly  6. Need for vaccination for Strep pneumoniae -pneumovax given  Labs/tests ordered:   Orders Placed This Encounter  Procedures  . Ambulatory referral to Cardiology    Referral Priority:   Routine    Referral Type:   Consultation    Referral Reason:   Specialty Services Required    Requested Specialty:   Cardiology    Number of Visits Requested:   1  cbc, cmp, flp, vitamin D  Next appt:  1 year for annual exam and wellness  Jovanka Westgate L. Kamali Nephew, D.O. [facility] [address], [place] [address] Cell Phone (Mon-Fri 8am-5pm):  [phone] On Call:  [phone] & follow prompts after 5pm & weekends Office Phone:  [phone] Office Fax:  [phone]

## 2016-12-03 NOTE — Addendum Note (Signed)
Addended by: Despina Hidden on: 12/03/2016 02:55 PM   Modules accepted: Orders

## 2016-12-09 LAB — HEPATIC FUNCTION PANEL
ALT: 10 (ref 10–40)
AST: 16 (ref 14–40)
Alkaline Phosphatase: 52 (ref 25–125)
Bilirubin, Total: 0.7

## 2016-12-09 LAB — BASIC METABOLIC PANEL
BUN: 11 (ref 4–21)
Creatinine: 0.7 (ref 0.6–1.3)
Glucose: 91
Potassium: 4.3 (ref 3.4–5.3)
Sodium: 139 (ref 137–147)

## 2016-12-09 LAB — CBC AND DIFFERENTIAL
HCT: 40 — AB (ref 41–53)
Hemoglobin: 13.3 — AB (ref 13.5–17.5)
Platelets: 262 (ref 150–399)
WBC: 4.3

## 2016-12-09 LAB — LIPID PANEL
Cholesterol: 200 (ref 0–200)
HDL: 77 — AB (ref 35–70)
LDL Cholesterol: 112
Triglycerides: 58 (ref 40–160)

## 2016-12-09 LAB — VITAMIN D 25 HYDROXY (VIT D DEFICIENCY, FRACTURES): Vit D, 25-Hydroxy: 57.88

## 2016-12-10 ENCOUNTER — Encounter: Payer: Self-pay | Admitting: Internal Medicine

## 2016-12-12 NOTE — Telephone Encounter (Signed)
12/12/2016 Received faxed referral from [facility] for upcoming appointment with Dr. Ellyn Hack at 11:40 am.  Records given to [facility].  cbr

## 2016-12-16 ENCOUNTER — Ambulatory Visit (INDEPENDENT_AMBULATORY_CARE_PROVIDER_SITE_OTHER): Payer: Medicare Other | Admitting: Cardiology

## 2016-12-16 ENCOUNTER — Telehealth: Payer: Self-pay | Admitting: Cardiology

## 2016-12-16 ENCOUNTER — Encounter: Payer: Self-pay | Admitting: Cardiology

## 2016-12-16 VITALS — BP 130/64 | HR 66 | Ht 71.0 in | Wt 157.2 lb

## 2016-12-16 DIAGNOSIS — I358 Other nonrheumatic aortic valve disorders: Secondary | ICD-10-CM

## 2016-12-16 NOTE — Progress Notes (Signed)
PCP: Gayland Curry, DO  Clinic Note: Chief Complaint  Patient presents with  . New Patient (Initial Visit)     heart murmur    HPI: Alec Weiss is a 83 y.o. male who is being seen today for the evaluation of Aortic Systolic Murmur at the request of Reed, Tiffany L, DO.  Alec Weiss was last seen on  Earlier this month by Dr. Mariea Clonts for routine Physical Examination. Her physical exam plus the physical exam performed by the anesthesiologis, who help out during his recent cataract surgery, revealed a new finding of a cardiac murmur. He now presents for evaluation. He is totally asymptomatic.  Recent Hospitalizations: none  Studies Personally Reviewed - (if available, images/films reviewed: From Epic Chart or Care Everywhere)  none  Interval History: Alec Weiss presents today with no complaint of chest tightness or pressure with rest or exertion. No PND, orthopnea or edema. No rapid irregular heartbeats or palpitations, syncope/near syncope or TIA/amaurosis fugax. He exercises vigorously on an almost daily basis and denies any active symptoms to suggest symptomatic valvular disease.  I questioned him about his early childhood to see that any history of potential rheumatic heart disease, he only remembers that there was an outbreak of scarlet fever near where he lived but he would was told that he never did develop full scarlet fever. 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 He has no recollection of any rheumatic fever symptoms. In fact he is only childhood condition he could remember was that he broke his arm.   No melena, hematochezia, hematuria, or epstaxis. No claudication.  ROS: A comprehensive was performed. Review of Systems  Constitutional: Negative for malaise/fatigue.  HENT: Negative for nosebleeds.   Eyes:       Recent cataract surgery  Respiratory: Negative for cough, shortness of breath and wheezing.   Cardiovascular: Negative.   Gastrointestinal: Negative for blood in stool and  melena.  Genitourinary: Positive for frequency (Nocturia). Negative for hematuria.  Musculoskeletal: Negative for falls, joint pain and myalgias.  Neurological: Negative for dizziness.  Endo/Heme/Allergies: Negative for environmental allergies. Does not bruise/bleed easily.  Psychiatric/Behavioral: Negative for depression and memory loss. The patient is not nervous/anxious and does not have insomnia.   All other systems reviewed and are negative.  I have reviewed and (if needed) personally updated the patient's problem list, medications, allergies, past medical and surgical history, social and family history.   Past Medical History:  Diagnosis Date  . Actinic keratosis 04/05/2012  . Insomnia, unspecified 06/08/2008  . Lumbago 01/02/2003  . 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 Pain in limb 02/05/2011  . Retinal detachment 2000   left eye  . Screening cholesterol level   . Shortness of breath 09/07/2008  . Vitamin D deficiency 08/04/2012    Past Surgical History:  Procedure Laterality Date  . EXTERNAL EAR SURGERY  05/2012   left ear   . INGUINAL HERNIA REPAIR Right 01/21/2005   Dr. [name]  . LID LESION EXCISION  2009  . MOHS SURGERY Left 05/28/2012   ear lobe Dr. [name]  . RETINAL DETACHMENT SURGERY Left [facility]    Current Meds  Medication Sig  . Vitamin D, Ergocalciferol, (DRISDOL) 50000 units CAPS capsule Take 1 capsule (50,000 Units total) by mouth every 7 (seven) days.    Allergies  Allergen Reactions  . Maxitrol [Neomycin-Polymyxin-Dexameth] Itching and Rash  . Neosporin [Neomycin-Bacitracin Zn-Polymyx] Itching and Rash    Social History   Social  History  . Marital status: Married    Spouse name: N/A  . Number of children: N/A  . Years of education: N/A   Occupational History  . retired Hydrologist professor    Social History Main Topics  . Smoking status: Never Smoker  . Smokeless tobacco: Never Used  . Alcohol use 1.2 oz/week    2 Glasses of  wine per week     Comment: at dinner  . Drug use: No  . Sexual activity: Not Asked   Other Topics Concern  . None   Social History Narrative   Patient is  Married [name]) since 1958. Retired professor, Hydrologist. Lves in apartment, Independent Living section at [facility] since 02/2012.    He is primary caregiver for spouse with dementia.   No Smoking history, drinks moderate amount of alcohol    Patient has Advanced planning documents: DNR   Exercise walking - daily ~1 1/2 - 2 miles (~ 1hr daily) as well as [employer] Exercise classes.    family history includes Cancer in his mother.  Wt Readings from Last 3 Encounters:  12/16/16 157 lb 3.2 oz (71.3 kg)  12/03/16 158 lb (71.7 kg)  11/28/15 165 lb (74.8 kg)    PHYSICAL EXAM BP 130/64   Pulse 66   Ht 5\' 11"  (1.803 m)   Wt 157 lb 3.2 oz (71.3 kg)   BMI 21.92 kg/m  Physical Exam  Constitutional: He is oriented to person, place, and time. He appears well-developed and well-nourished. No distress.  Definitely appears younger than his stated age.  HENT:  Head: Normocephalic and atraumatic.  Mouth/Throat: Oropharynx is clear and moist. No oropharyngeal exudate.  Eyes: Pupils are equal, round, and reactive to light. Conjunctivae are normal. Scleral icterus is present.  Neck: Normal range of motion. Neck supple. No hepatojugular reflux and no JVD present. Carotid bruit is not present. No thyromegaly present.  Cardiovascular: Normal rate, regular rhythm, intact distal pulses and normal pulses.   Occasional extrasystoles are present. PMI is not displaced.  Exam reveals no gallop.   Murmur heard.  Medium-pitched harsh crescendo-decrescendo midsystolic murmur is present with a grade of 2/6  at the upper right sternal border radiating to the neck Pulmonary/Chest: Effort normal and breath sounds normal. No respiratory distress. He has no wheezes. He has no rales. He exhibits no tenderness.  Abdominal: Soft. Bowel sounds  are normal. He exhibits no distension. There is no tenderness. There is no rebound and no guarding.  Musculoskeletal: Normal range of motion. He exhibits no edema, tenderness or deformity.  Neurological: He is alert and oriented to person, place, and time. He has normal reflexes. No cranial nerve deficit.  Skin: Skin is warm and dry. No rash noted. No erythema. No pallor.  Psychiatric: He has a normal mood and affect. His behavior is normal. Judgment and thought content normal.     Adult ECG Report  Rate: 66 ;  Rhythm: normal sinus rhythm, sinus arrhythmia and Otherwise normal axis, intervals and durations;   Narrative Interpretation: relatively normal EKG   Other studies Reviewed: Additional studies/ records that were reviewed today include:  Recent Labs:   None available    ASSESSMENT / PLAN: Problem List Items Addressed This Visit    Aortic systolic murmur on examination - Primary (Chronic)     83 year old gentleman with I definitely diagnosed Aortic Systolic murmur.  Probably this  Represents aortic  Sclerosis versus mild stenosis.Marland Kitchen However given his age it could be on previously  diagnosed significant aortic stenosis. Plan: Check  2-D echocardiogram.      Relevant Orders   EKG 12-Lead (Completed)   ECHOCARDIOGRAM COMPLETE      Current medicines are reviewed at length with the patient today. (+/- concerns)  none The following changes have been made:  none  Patient Instructions  No change to medications at present time    Schedule at [place] has requested that you have an echocardiogram. Echocardiography is a painless test that uses sound waves to create images of your heart. It provides your doctor with information about the size and shape of your heart and how well your heart's chambers and valves are working. This procedure takes approximately one hour. There are no restrictions for this procedure.    Your physician recommends  that you schedule a follow-up appointment in 1 MONTH WITH DR HARDING IF ABNORMAL    Studies Ordered:   Orders Placed This Encounter  Procedures  . EKG 12-Lead  . ECHOCARDIOGRAM COMPLETE      Glenetta Hew, M.D., M.S. Interventional Cardiologist   Pager # [phone] Phone # [phone] [address]. [place], [place] [address]

## 2016-12-16 NOTE — Patient Instructions (Signed)
No change to medications at present time    Schedule at [place] [facility] has requested that you have an echocardiogram. Echocardiography is a painless test that uses sound waves to create images of your heart. It provides your doctor with information about the size and shape of your heart and how well your heart's chambers and valves are working. This procedure takes approximately one hour. There are no restrictions for this procedure.    Your physician recommends that you schedule a follow-up appointment in 1 MONTH WITH DR HARDING IF ABNORMAL

## 2016-12-17 ENCOUNTER — Encounter: Payer: Self-pay | Admitting: Cardiology

## 2016-12-17 NOTE — Assessment & Plan Note (Signed)
83 year old gentleman with I definitely diagnosed Aortic Systolic murmur.  Probably this  Represents aortic  Sclerosis versus mild stenosis.Marland Kitchen However given his age it could be on previously diagnosed significant aortic stenosis. Plan: Check  2-D echocardiogram.

## 2017-01-13 ENCOUNTER — Encounter (INDEPENDENT_AMBULATORY_CARE_PROVIDER_SITE_OTHER): Payer: Medicare Other | Admitting: Ophthalmology

## 2017-01-13 DIAGNOSIS — H43813 Vitreous degeneration, bilateral: Secondary | ICD-10-CM

## 2017-01-13 DIAGNOSIS — H35372 Puckering of macula, left eye: Secondary | ICD-10-CM | POA: Diagnosis not present

## 2017-01-13 DIAGNOSIS — H338 Other retinal detachments: Secondary | ICD-10-CM

## 2017-01-13 DIAGNOSIS — H4423 Degenerative myopia, bilateral: Secondary | ICD-10-CM | POA: Diagnosis not present

## 2017-01-16 ENCOUNTER — Ambulatory Visit (HOSPITAL_COMMUNITY): Payer: Medicare Other | Attending: Cardiology

## 2017-01-16 ENCOUNTER — Other Ambulatory Visit: Payer: Self-pay

## 2017-01-16 ENCOUNTER — Other Ambulatory Visit (HOSPITAL_COMMUNITY): Payer: Medicare Other

## 2017-01-16 DIAGNOSIS — I358 Other nonrheumatic aortic valve disorders: Secondary | ICD-10-CM

## 2017-01-16 DIAGNOSIS — R011 Cardiac murmur, unspecified: Secondary | ICD-10-CM | POA: Insufficient documentation

## 2017-01-16 DIAGNOSIS — I371 Nonrheumatic pulmonary valve insufficiency: Secondary | ICD-10-CM | POA: Insufficient documentation

## 2017-01-16 DIAGNOSIS — I080 Rheumatic disorders of both mitral and aortic valves: Secondary | ICD-10-CM | POA: Insufficient documentation

## 2017-01-19 ENCOUNTER — Ambulatory Visit: Payer: Medicare Other | Admitting: Cardiology

## 2017-08-13 ENCOUNTER — Encounter (INDEPENDENT_AMBULATORY_CARE_PROVIDER_SITE_OTHER): Payer: Medicare Other | Admitting: Ophthalmology

## 2017-08-13 DIAGNOSIS — H43811 Vitreous degeneration, right eye: Secondary | ICD-10-CM

## 2017-08-13 DIAGNOSIS — H59032 Cystoid macular edema following cataract surgery, left eye: Secondary | ICD-10-CM

## 2017-08-13 DIAGNOSIS — H4423 Degenerative myopia, bilateral: Secondary | ICD-10-CM | POA: Diagnosis not present

## 2017-08-13 DIAGNOSIS — H35373 Puckering of macula, bilateral: Secondary | ICD-10-CM | POA: Diagnosis not present

## 2017-12-09 ENCOUNTER — Encounter: Payer: Self-pay | Admitting: Internal Medicine

## 2017-12-09 ENCOUNTER — Non-Acute Institutional Stay (SKILLED_NURSING_FACILITY): Payer: Medicare Other | Admitting: Internal Medicine

## 2017-12-09 VITALS — BP 130/70 | HR 66 | Temp 97.8°F | Ht 71.0 in | Wt 156.0 lb

## 2017-12-09 DIAGNOSIS — E559 Vitamin D deficiency, unspecified: Secondary | ICD-10-CM | POA: Diagnosis not present

## 2017-12-09 DIAGNOSIS — D62 Acute posthemorrhagic anemia: Secondary | ICD-10-CM | POA: Insufficient documentation

## 2017-12-09 DIAGNOSIS — D649 Anemia, unspecified: Secondary | ICD-10-CM

## 2017-12-09 DIAGNOSIS — Z0000 Encounter for general adult medical examination without abnormal findings: Secondary | ICD-10-CM

## 2017-12-09 DIAGNOSIS — I358 Other nonrheumatic aortic valve disorders: Secondary | ICD-10-CM | POA: Diagnosis not present

## 2017-12-09 MED ORDER — TETANUS-DIPHTH-ACELL PERTUSSIS 5-2.5-18.5 LF-MCG/0.5 IM SUSP: 0.5000 mL | Freq: Once | INTRAMUSCULAR | 0 refills | Status: AC

## 2017-12-09 NOTE — Progress Notes (Signed)
Provider:  Rexene Edison. Mariea Clonts, D.O., C.M.D. Location:  Artist of Service:  Clinic (12)  Previous PCP: Gayland Curry, DO Patient Care Team: Gayland Curry, DO as PCP - General (Geriatric Medicine) [facility] [name], MD as Consulting Physician (Ophthalmology)  Extended Emergency Contact Information Primary Emergency Contact: [place] Phone: [phone] Relation: Daughter  Code Status: DNR, MOST Goals of Care: Advanced Directive information Advanced Directives 12/09/2017  Does Patient Have a Medical Advance Directive? Yes  Type of Paramedic of [place];Living will;Out of facility DNR (pink MOST or yellow form)  Does patient want to make changes to medical advance directive? No - Patient declined  Copy of [place] in Chart? Yes  Pre-existing out of facility DNR order (yellow form or pink MOST form) Yellow form placed in chart (order not valid for inpatient use);Pink MOST form placed in chart (order not valid for inpatient use)   Chief Complaint  Patient presents with  . Annual Exam    CPE  . MMSE    29/30 passed clock    HPI: Patient is a 84 y.o. Weiss seen today for an annual physical exam.  Wants his flu shot and tdap.  There's a new grandbaby in the family coming soon.  Has been challenging to adjust after cataract surgery.    His heart murmur was found to be benign.    He favors his left shoulder in exercise class.   Bell ringing irritated it also.  He's not doing this anymore.  MMSE - Mini Mental State Exam 12/09/2017 12/03/2016 11/28/2015  Orientation to time 4 5 4   Orientation to Place 5 5 5   Registration 3 3 3   Attention/ Calculation 5 5 5   Recall 3 2 3   Language- name 2 objects 2 2 2   Language- repeat 1 1 1   Language- follow 3 step command 3 3 3   Language- read & follow direction 1 1 1   Write a sentence 1 1 1   Copy  design 1 1 1   Total score 29 29 29     Past Medical History:  Diagnosis Date  . Actinic keratosis 04/05/2012  . Insomnia, unspecified 06/08/2008  . Lumbago 01/02/2003  . 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 Pain in limb 02/05/2011  . Retinal detachment 2000   left eye  . Screening cholesterol level   . Shortness of breath 09/07/2008  . Vitamin D deficiency 08/04/2012   Past Surgical History:  Procedure Laterality Date  . EXTERNAL EAR SURGERY  05/2012   left ear   . INGUINAL HERNIA REPAIR Right 01/21/2005   Dr. [name]  . LID LESION EXCISION  2009  . MOHS SURGERY Left 05/28/2012   ear lobe Dr. [name]  . [facility]    reports that he has never smoked. He has never used smokeless tobacco. He reports that he drinks about 2.0 standard drinks of alcohol per week. He reports that he does not use drugs.  Functional Status Survey:  independent in all adls  Family History  Problem Relation Age of Onset  . Cancer Mother        breast    Health Maintenance  Topic Date Due  . Samul Dada  04/08/1945  . INFLUENZA VACCINE  10/01/2017  . PNA vac Low Risk Adult  Completed    Allergies  Allergen Reactions  . Benzalkonium Chloride Rash  Very allergic per patient   . Maxitrol [Neomycin-Polymyxin-Dexameth] Itching and Rash  . Neosporin [Neomycin-Bacitracin Zn-Polymyx] Itching and Rash    Outpatient Encounter Medications as of 12/09/2017  Medication Sig  . Tdap (BOOSTRIX) 5-2.5-18.5 LF-MCG/0.5 injection Inject 0.5 mLs into the muscle once for 1 dose.  . Vitamin D, Ergocalciferol, (DRISDOL) 50000 units CAPS capsule Take 1 capsule (50,000 Units total) by mouth every 7 (seven) days.  . [DISCONTINUED] Tdap (BOOSTRIX) 5-2.5-18.5 LF-MCG/0.5 injection Inject 0.5 mLs into the muscle once.   No facility-administered encounter medications on file as of 12/09/2017.     Review of Systems  Constitutional: Negative for chills, fever and  malaise/fatigue.  HENT: Negative for congestion and hearing loss.   Eyes: Negative for blurred vision and double vision.  Respiratory: Negative for shortness of breath.   Cardiovascular: Negative for chest pain, palpitations and leg swelling.  Gastrointestinal: Negative for abdominal pain, blood in stool, constipation, diarrhea and melena.  Genitourinary: Negative for dysuria.  Musculoskeletal: Negative for falls and joint pain.  Neurological: Negative for dizziness and loss of consciousness.  Endo/Heme/Allergies: Does not bruise/bleed easily.  Psychiatric/Behavioral: Negative for depression and memory loss.    Vitals:   12/09/17 1434  BP: 130/70  Pulse: 66  Temp: 97.8 F (36.6 C)  TempSrc: Oral  SpO2: 98%  Weight: 156 lb (70.8 kg)  Height: 5\' 11"  (1.803 m)   Body mass index is 21.76 kg/m. Physical Exam  Constitutional: He is oriented to person, place, and time. He appears well-developed and well-nourished. No distress.  Cardiovascular: Normal rate, regular rhythm and intact distal pulses.  Murmur heard. Pulmonary/Chest: Effort normal and breath sounds normal. No respiratory distress.  Abdominal: Bowel sounds are normal.  Musculoskeletal: Normal range of motion. He exhibits no tenderness.  Neurological: He is alert and oriented to person, place, and time.  Skin: Skin is warm and dry. Capillary refill takes less than 2 seconds.  Psychiatric: He has a normal mood and affect.    Labs reviewed:  Lab Results  Component Value Date   HGBA1C 5.9 03/09/2012   Assessment/Plan 1. Annual physical exam -performed today, will get flu shot at pharmacy as does not want to wait until vaccines are given at [facility]  -also Rx sent to pharmacy for tdap  2. Vitamin D deficiency -last level at goal, cont same supplementation  3. Aortic systolic murmur on examination -had echo and eval with cardiology which was felt to be negati  4. Anemia, unspecified type -f/u cbc in  am  Labs/tests ordered:  Cbc, cmp, flp in am  Tona Qualley L. Bevin Das, D.O. [facility] [address], [place] [address] Cell Phone (Mon-Fri 8am-5pm):  [phone] On Call:  [phone] & follow prompts after 5pm & weekends Office Phone:  [phone] Office Fax:  [phone]

## 2017-12-10 ENCOUNTER — Encounter: Payer: Self-pay | Admitting: Internal Medicine

## 2017-12-10 ENCOUNTER — Ambulatory Visit (INDEPENDENT_AMBULATORY_CARE_PROVIDER_SITE_OTHER): Payer: Medicare Other | Admitting: *Deleted

## 2017-12-10 DIAGNOSIS — Z23 Encounter for immunization: Secondary | ICD-10-CM | POA: Diagnosis not present

## 2017-12-10 LAB — BASIC METABOLIC PANEL
BUN: 13 (ref 4–21)
Creatinine: 0.8 (ref 0.6–1.3)
Glucose: 89
Potassium: 4.5 (ref 3.4–5.3)
Sodium: 138 (ref 137–147)

## 2017-12-10 LAB — HEPATIC FUNCTION PANEL
ALT: 11 (ref 10–40)
AST: 16 (ref 14–40)
Alkaline Phosphatase: 54 (ref 25–125)
Bilirubin, Total: 0.8

## 2017-12-10 LAB — CBC AND DIFFERENTIAL
HCT: 40 — AB (ref 41–53)
Hemoglobin: 13.3 — AB (ref 13.5–17.5)
Platelets: 264 (ref 150–399)
WBC: 4.1

## 2017-12-10 LAB — LIPID PANEL
Cholesterol: 202 — AB (ref 0–200)
HDL: 79 — AB (ref 35–70)
LDL Cholesterol: 113
Triglycerides: 54 (ref 40–160)

## 2017-12-11 ENCOUNTER — Encounter: Payer: Self-pay | Admitting: Internal Medicine

## 2018-01-07 ENCOUNTER — Telehealth: Payer: Self-pay

## 2018-01-07 NOTE — Telephone Encounter (Signed)
I recommend we meet briefly to discuss the changes and complete a new form.

## 2018-01-07 NOTE — Telephone Encounter (Signed)
Spoke with patient, scheduled appointment for 02/03/18 @ 9:00 am

## 2018-01-07 NOTE — Telephone Encounter (Signed)
Patient would like to talk to Dr.Reed about changing his MOST form  Please advise if patient needs an appointment or if a MOST form can be given to patient on Tuesday 01/12/18 when you are at [facility]

## 2018-01-26 ENCOUNTER — Other Ambulatory Visit: Payer: Self-pay | Admitting: Internal Medicine

## 2018-02-01 ENCOUNTER — Telehealth: Payer: Self-pay | Admitting: Internal Medicine

## 2018-02-01 NOTE — Telephone Encounter (Signed)
I left a message asking the patient to call me at [phone] to schedule AWV with Clarise Cruz at [facility] on 03/02/18 if available. Last AWV 12/03/16. VDM (DD)

## 2018-02-03 ENCOUNTER — Non-Acute Institutional Stay: Payer: Medicare Other | Admitting: Internal Medicine

## 2018-02-03 ENCOUNTER — Encounter: Payer: Self-pay | Admitting: Internal Medicine

## 2018-02-03 ENCOUNTER — Non-Acute Institutional Stay: Payer: Medicare Other

## 2018-02-03 VITALS — BP 120/60 | HR 73 | Temp 97.8°F | Ht 71.0 in | Wt 156.0 lb

## 2018-02-03 DIAGNOSIS — M19042 Primary osteoarthritis, left hand: Secondary | ICD-10-CM | POA: Diagnosis not present

## 2018-02-03 DIAGNOSIS — Z0000 Encounter for general adult medical examination without abnormal findings: Secondary | ICD-10-CM

## 2018-02-03 DIAGNOSIS — Z7189 Other specified counseling: Secondary | ICD-10-CM | POA: Diagnosis not present

## 2018-02-03 MED ORDER — ZOSTER VAC RECOMB ADJUVANTED 50 MCG/0.5ML IM SUSR: 0.5000 mL | Freq: Once | INTRAMUSCULAR | 1 refills | Status: AC

## 2018-02-03 NOTE — Progress Notes (Addendum)
Subjective:   Alec Weiss is a 84 y.o. male who presents for Medicare Annual/Subsequent preventive examination at [facility]- 12/03/2016    Objective:    Vitals: BP 120/60 (BP Location: Left Arm, Patient Position: Sitting)   Pulse 73   Temp 97.8 F (36.6 C) (Oral)   Ht 5\' 11"  (1.803 m)   Wt 156 lb (70.8 kg)   BMI 21.76 kg/m   Body mass index is 21.76 kg/m.  Advanced Directives 02/03/2018 12/09/2017 12/03/2016 05/13/2016 11/28/2015 06/06/2015 11/22/2014  Does Patient Have a Medical Advance Directive? Yes Yes Yes No Yes Yes Yes  Type of Paramedic of [place];Living will;Out of facility DNR (pink MOST or yellow form) [place];Living will;Out of facility DNR (pink MOST or yellow form) Living will;Out of facility DNR (pink MOST or yellow form) - Out of facility DNR (pink MOST or yellow form) Out of facility DNR (pink MOST or yellow form) Living will;Out of facility DNR (pink MOST or yellow form)  Does patient want to make changes to medical advance directive? No - Patient declined No - Patient declined - - - No - Patient declined -  Copy of [place] in Chart? Yes - validated most recent copy scanned in chart (See row information) Yes - - Yes Yes Yes  Pre-existing out of facility DNR order (yellow form or pink MOST form) Yellow form placed in chart (order not valid for inpatient use);Pink MOST form placed in chart (order not valid for inpatient use) Yellow form placed in chart (order not valid for inpatient use);Pink MOST form placed in chart (order not valid for inpatient use) Yellow form placed in chart (order not valid for inpatient use) - Yellow form placed in chart (order not valid for inpatient use) - Yellow form placed in chart (order not valid for inpatient use)    Tobacco Social History   Tobacco Use  Smoking Status Never Smoker  Smokeless Tobacco Never Used     Counseling given: Not  Answered   Clinical Intake:  Pre-visit preparation completed: No  Pain : No/denies pain        How often do you need to have someone help you when you read instructions, pamphlets, or other written materials from your doctor or pharmacy?: 1 - Never What is the last grade level you completed in school?: PHD  Interpreter Needed?: No  Information entered by :: [name], RN  Past Medical History:  Diagnosis Date  . Actinic keratosis 04/05/2012  . Insomnia, unspecified 06/08/2008  . Lumbago 01/02/2003  . 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 Pain in limb 02/05/2011  . Retinal detachment 2000   left eye  . Screening cholesterol level   . Shortness of breath 09/07/2008  . Vitamin D deficiency 08/04/2012   Past Surgical History:  Procedure Laterality Date  . EXTERNAL EAR SURGERY  05/2012   left ear   . INGUINAL HERNIA REPAIR Right 01/21/2005   Dr. [name]  . LID LESION EXCISION  2009  . MOHS SURGERY Left 05/28/2012   ear lobe Dr. [name]  . RETINAL DETACHMENT SURGERY Left [facility]   Family History  Problem Relation Age of Onset  . Cancer Mother        breast   Social History   Socioeconomic History  . Marital status: Widowed    Spouse name: Not on file  . Number of children: Not on  file  . Years of education: Not on file  . Highest education level: Not on file  Occupational History  . Occupation: retired Hydrologist professor  Social Needs  . Financial resource strain: Not hard at all  . Food insecurity:    Worry: Never true    Inability: Never true  . Transportation needs:    Medical: No    Non-medical: No  Tobacco Use  . Smoking status: Never Smoker  . Smokeless tobacco: Never Used  Substance and Sexual Activity  . Alcohol use: Yes    Alcohol/week: 7.0 standard drinks    Types: 7 Glasses of wine per week    Comment: at dinner  . Drug use: No  . Sexual activity: Not on file  Lifestyle  . Physical activity:    Days per week: 3 days     Minutes per session: 50 min  . Stress: Not at all  Relationships  . Social connections:    Talks on phone: More than three times a week    Gets together: More than three times a week    Attends religious service: Never    Active member of club or organization: Yes    Attends meetings of clubs or organizations: More than 4 times per year    Relationship status: Widowed  Other Topics Concern  . Not on file  Social History Narrative   Patient is  Married Alec Weiss) since 1958. Retired professor, Hydrologist. Lves in apartment, Independent Living section at [facility] since 02/2012.    He is primary caregiver for spouse with dementia.   No Smoking history, drinks moderate amount of alcohol    Patient has Advanced planning documents: DNR   Exercise walking - daily ~1 1/2 - 2 miles (~ 1hr daily) as well as [facility] Exercise classes.    Outpatient Encounter Medications as of 02/03/2018  Medication Sig  . Zoster Vaccine Adjuvanted [facility]) injection Inject 0.5 mLs into the muscle once for 1 dose.  . [DISCONTINUED] Zoster Vaccine Adjuvanted [facility]) injection Inject 0.5 mLs into the muscle once.  . Vitamin D, Ergocalciferol, (DRISDOL) 1.25 MG (50000 UT) CAPS capsule TAKE ONE CAPSULE ONCE A WEEK FOR VITAMIN D SUPPLEMENT.   No facility-administered encounter medications on file as of 02/03/2018.     Activities of Daily Living In your present state of health, do you have any difficulty performing the following activities: 02/03/2018  Hearing? N  Vision? N  Difficulty concentrating or making decisions? N  Walking or climbing stairs? N  Dressing or bathing? N  Doing errands, shopping? N  Preparing Food and eating ? N  Using the Toilet? N  In the past six months, have you accidently leaked urine? N  Do you have problems with loss of bowel control? N  Managing your Medications? N  Managing your Finances? N  Housekeeping or managing your Housekeeping? N  Some recent data  might be hidden    Patient Care Team: Gayland Curry, DO as PCP - General (Geriatric Medicine) [facility] [name], MD as Consulting Physician (Ophthalmology)   Assessment:   This is a routine wellness examination for Alec Weiss.  Exercise Activities and Dietary recommendations Current Exercise Habits: Structured exercise class, Type of exercise: yoga;Other - see comments(chairfit), Time (Minutes): 45, Frequency (Times/Week): 3, Weekly Exercise (Minutes/Week): 135, Intensity: Mild, Exercise limited by: None identified  Goals   None     Fall Risk Fall Risk  02/03/2018 12/09/2017 12/03/2016 11/28/2015 06/06/2015  Falls in the  past year? 0 No No No No  Number falls in past yr: 0 - - - -  Injury with Fall? 0 - - - -   Is the patient's home free of loose throw rugs in walkways, pet beds, electrical cords, etc?   yes      Grab bars in the bathroom? yes      Handrails on the stairs?   yes      Adequate lighting?   yes  Depression Screen PHQ 2/9 Scores 02/03/2018 12/09/2017 12/03/2016 11/28/2015  PHQ - 2 Score 0 0 0 0    Cognitive Function completed within the last year MMSE - [place] Exam 12/09/2017 12/03/2016 11/28/2015 11/22/2014  Orientation to time 4 5 4 5   Orientation to Place 5 5 5 5   Registration 3 3 3 3   Attention/ Calculation 5 5 5 5   Recall 3 2 3 3   Language- name 2 objects 2 2 2 2   Language- repeat 1 1 1 1   Language- follow 3 step command 3 3 3 3   Language- read & follow direction 1 1 1 1   Write a sentence 1 1 1 1   Copy design 1 1 1 1   Total score 29 29 29 30         Immunization History  Administered Date(s) Administered  . Hepatitis A 01/24/2002  . Hepatitis B 01/24/2002  . IPV 08/18/2001  . Influenza Whole 12/02/2011, 12/31/2012  . Influenza, High Dose Seasonal PF 12/10/2017  . Influenza,inj,Quad PF,6+ Mos 12/20/2013  . Influenza-Unspecified 12/21/2014, 12/27/2015, 12/22/2016  . Pneumococcal Conjugate-13 11/22/2014  .  Pneumococcal Polysaccharide-23 12/03/2016  . Tdap 12/10/2017  . Typhoid Inactivated 07/21/2001  . Zoster 02/01/2011    Qualifies for Shingles Vaccine? Yes, educated and ordered to pharmacy  Screening Tests Health Maintenance  Topic Date Due  . TETANUS/TDAP  12/11/2027  . INFLUENZA VACCINE  Completed  . PNA vac Low Risk Adult  Completed   Cancer Screenings: Lung: Low Dose CT Chest recommended if Age 82-80 years, 30 pack-year currently smoking OR have quit w/in 15years. Patient does not qualify. Colorectal: up to date  Additional Screenings:  Hepatitis C Screening:declined       Plan:    I have personally reviewed and addressed the Medicare Annual Wellness questionnaire and have noted the following in the patient's chart:  A. Medical and social history B. Use of alcohol, tobacco or illicit drugs  C. Current medications and supplements D. Functional ability and status E.  Nutritional status F.  Physical activity G. Advance directives H. List of other physicians I.  Hospitalizations, surgeries, and ER visits in previous 12 months J.  [place] to include hearing, vision, cognitive, depression L. Referrals and appointments - none  In addition, I have reviewed and discussed with patient certain preventive protocols, quality metrics, and best practice recommendations. A written personalized care plan for preventive services as well as general preventive health recommendations were provided to patient.  See attached scanned questionnaire for additional information.   Signed,   [name], RN Nurse Health Advisor  Patient Concerns: none

## 2018-02-03 NOTE — Progress Notes (Signed)
Location:  [place] of Service:  Clinic (12)  Provider: Chantea Surace L. Mariea Clonts, D.O., C.M.D.  Code Status: DNR, MOST Goals of Care:  Advanced Directives 12/09/2017  Does Patient Have a Medical Advance Directive? Yes  Type of Paramedic of [place];Living will;Out of facility DNR (pink MOST or yellow form)  Does patient want to make changes to medical advance directive? No - Patient declined  Copy of [place] in Chart? Yes  Pre-existing out of facility DNR order (yellow form or pink MOST form) Yellow form placed in chart (order not valid for inpatient use);Pink MOST form placed in chart (order not valid for inpatient use)     Chief Complaint  Patient presents with  . Advance Care Planning    advanced care planning  . Acute Visit    left fifth finger is turning inward and he gets some sensations in his left middle three fingers he doesn't have on the other side    HPI: Patient is a 84 y.o. male seen today for an acute visit for advance care planning--MOST review--and for left fifth digit deformity and discomfort.  He plays woodwind instruments and uses his hands regularly.  He notes some discomfort in the fifth digit, but only for short times, not persistent or requiring meds.  He also has some increased size of his knuckles of his other fingers.  He's got a curving deformity of his fifth finger that's progressing.  Past Medical History:  Diagnosis Date  . Actinic keratosis 04/05/2012  . Insomnia, unspecified 06/08/2008  . Lumbago 01/02/2003  . 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 Pain in limb 02/05/2011  . Retinal detachment 2000   left eye  . Screening cholesterol level   . Shortness of breath 09/07/2008  . Vitamin D deficiency 08/04/2012    Past Surgical History:  Procedure Laterality Date  . EXTERNAL EAR SURGERY  05/2012   left ear   . INGUINAL HERNIA REPAIR Right 01/21/2005   Dr. [name]  . LID LESION EXCISION  2009   . MOHS SURGERY Left 05/28/2012   ear lobe Dr. [name]  . RETINAL DETACHMENT SURGERY Left [facility]    Allergies  Allergen Reactions  . Benzalkonium Chloride Rash    Very allergic per patient   . Maxitrol [Neomycin-Polymyxin-Dexameth] Itching and Rash  . Neosporin [Neomycin-Bacitracin Zn-Polymyx] Itching and Rash    Outpatient Encounter Medications as of 02/03/2018  Medication Sig  . Vitamin D, Ergocalciferol, (DRISDOL) 1.25 MG (50000 UT) CAPS capsule TAKE ONE CAPSULE ONCE A WEEK FOR VITAMIN D SUPPLEMENT.   No facility-administered encounter medications on file as of 02/03/2018.     Review of Systems:  Review of Systems  Constitutional: Negative for malaise/fatigue.  Musculoskeletal: Positive for joint pain.       Left fifth digit curvature  Neurological: Negative for tingling and sensory change.    Health Maintenance  Topic Date Due  . TETANUS/TDAP  12/11/2027  . INFLUENZA VACCINE  Completed  . PNA vac Low Risk Adult  Completed    Physical Exam: Vitals:   02/03/18 0905  BP: 120/60  Pulse: 73  Temp: 97.8 F (36.6 C)  TempSrc: Oral  SpO2: 97%  Weight: 156 lb (70.8 kg)  Height: '5\' 11"'$  (1.803 m)   Body mass index is 21.76 kg/m. Physical Exam  Constitutional: He is oriented to person, place, and time. He appears well-developed and well-nourished. No distress.  Musculoskeletal:  Left  fifth digit is curving toward his 4th digit, nontender to touch now, has palpable and visible heberden's nodes of his fingers as well  Neurological: He is alert and oriented to person, place, and time.  Skin: Skin is warm and dry.  Psychiatric: He has a normal mood and affect.    Labs reviewed: Basic Metabolic Panel: Recent Labs    12/10/17 1200  NA 138  K 4.5  BUN 13  CREATININE 0.8   Liver Function Tests: Recent Labs    12/10/17 1200  AST 16  ALT 11  ALKPHOS 54   No results for input(s): LIPASE, AMYLASE in the last 8760 hours. No results for input(s):  AMMONIA in the last 8760 hours. CBC: Recent Labs    12/10/17 1200  WBC 4.1  HGB 13.3*  HCT 40*  PLT 264   Lipid Panel: Recent Labs    12/10/17 1200  CHOL 202*  HDL 79*  LDLCALC 113  TRIG 54   Lab Results  Component Value Date   HGBA1C 5.9 03/09/2012   Assessment/Plan 1. Osteoarthritis of finger of left hand -counseled on continued use and exercise -may use tylenol or topicals for pain like aspercreme or presciption voltaren--he will let me know if it gets to that point  2. ACP (advance care planning) Last ACP Note 11/05/2017 to 02/03/2018       ACP (Advance Care Planning) by Gayland Curry, DO at 02/03/2018  9:00 AM    Date of Service   Author Author Type Status Note Type File Time  02/03/2018 Addend Gayland Curry, DO Physician Signed ACP (Advance Care Planning) 02/03/2018             Met with patient and reviewed his MOST form for the year.  His wishes have not changed.  He requests the following:  DNR, limited additional interventions--agreeable to hospitalization at this time as he is walking, active, cognitively intact and doing quite well so would expect recovery.  He says he may not feel that way in 5 years.  He also requests short term antibiotics and fluids, but no tube feeding.  He asks if I agree with his selections and I expressed that he made good choices.  Form was updated and copies made for scanning and for [facility] clinic nurse.    20 mins spent on acp.               Labs/tests ordered:  No new Next appt:  Keep already scheduled appt in oct  [place]. Aiyana Stegmann, D.O. [facility] [address], [place] [address] Cell Phone (Mon-Fri 8am-5pm):  [phone] On Call:  [phone] & follow prompts after 5pm & weekends Office Phone:  [phone] Office Fax:  [phone]

## 2018-02-03 NOTE — Patient Instructions (Signed)
Alec Weiss , Thank you for taking time to come for your Medicare Wellness Visit. I appreciate your ongoing commitment to your health goals. Please review the following plan we discussed and let me know if I can assist you in the future.   Screening recommendations/referrals: Colonoscopy excluded, over age 82 Recommended yearly ophthalmology/optometry visit for glaucoma screening and checkup Recommended yearly dental visit for hygiene and checkup  Vaccinations: Influenza vaccine up to date Pneumococcal vaccine up to date, completed Tdap vaccine up to date, due 12/11/2027 Shingles vaccine due, ordered to pharmacy    Advanced directives: in chart  Conditions/risks identified: none  Next appointment: Dr. Mariea Clonts 12/15/2018 @ 2:30pm  Preventive Care 82 Years and Older, Male Preventive care refers to lifestyle choices and visits with your health care provider that can promote health and wellness. What does preventive care include?  A yearly physical exam. This is also called an annual well check.  Dental exams once or twice a year.  Routine eye exams. Ask your health care provider how often you should have your eyes checked.  Personal lifestyle choices, including:  Daily care of your teeth and gums.  Regular physical activity.  Eating a healthy diet.  Avoiding tobacco and drug use.  Limiting alcohol use.  Practicing safe sex.  Taking low doses of aspirin every day.  Taking vitamin and mineral supplements as recommended by your health care provider. What happens during an annual well check? The services and screenings done by your health care provider during your annual well check will depend on your age, overall health, lifestyle risk factors, and family history of disease. Counseling  Your health care provider may ask you questions about your:  Alcohol use.  Tobacco use.  Drug use.  Emotional well-being.  Home and relationship well-being.  Sexual  activity.  Eating habits.  History of falls.  Memory and ability to understand (cognition).  Work and work Statistician. Screening  You may have the following tests or measurements:  Height, weight, and BMI.  Blood pressure.  Lipid and cholesterol levels. These may be checked every 5 years, or more frequently if you are over 27 years old.  Skin check.  Lung cancer screening. You may have this screening every year starting at age 90 if you have a 30-pack-year history of smoking and currently smoke or have quit within the past 15 years.  Fecal occult blood test (FOBT) of the stool. You may have this test every year starting at age 37.  Flexible sigmoidoscopy or colonoscopy. You may have a sigmoidoscopy every 5 years or a colonoscopy every 10 years starting at age 64.  Prostate cancer screening. Recommendations will vary depending on your family history and other risks.  Hepatitis C blood test.  Hepatitis B blood test.  Sexually transmitted disease (STD) testing.  Diabetes screening. This is done by checking your blood sugar (glucose) after you have not eaten for a while (fasting). You may have this done every 1-3 years.  Abdominal aortic aneurysm (AAA) screening. You may need this if you are a current or former smoker.  Osteoporosis. You may be screened starting at age 67 if you are at high risk. Talk with your health care provider about your test results, treatment options, and if necessary, the need for more tests. Vaccines  Your health care provider may recommend certain vaccines, such as:  Influenza vaccine. This is recommended every year.  Tetanus, diphtheria, and acellular pertussis (Tdap, Td) vaccine. You may need a Td booster every  10 years.  Zoster vaccine. You may need this after age 76.  Pneumococcal 13-valent conjugate (PCV13) vaccine. One dose is recommended after age 82.  Pneumococcal polysaccharide (PPSV23) vaccine. One dose is recommended after age  87. Talk to your health care provider about which screenings and vaccines you need and how often you need them. This information is not intended to replace advice given to you by your health care provider. Make sure you discuss any questions you have with your health care provider. Document Released: 03/16/2015 Document Revised: 11/07/2015 Document Reviewed: 12/19/2014 Elsevier Interactive Patient Education  2017 [place] Prevention in the Home Falls can cause injuries. They can happen to people of all ages. There are many things you can do to make your home safe and to help prevent falls. What can I do on the outside of my home?  Regularly fix the edges of walkways and driveways and fix any cracks.  Remove anything that might make you trip as you walk through a door, such as a raised step or threshold.  Trim any bushes or trees on the path to your home.  Use bright outdoor lighting.  Clear any walking paths of anything that might make someone trip, such as rocks or tools.  Regularly check to see if handrails are loose or broken. Make sure that both sides of any steps have handrails.  Any raised decks and porches should have guardrails on the edges.  Have any leaves, snow, or ice cleared regularly.  Use sand or salt on walking paths during winter.  Clean up any spills in your garage right away. This includes oil or grease spills. What can I do in the bathroom?  Use night lights.  Install grab bars by the toilet and in the tub and shower. Do not use towel bars as grab bars.  Use non-skid mats or decals in the tub or shower.  If you need to sit down in the shower, use a plastic, non-slip stool.  Keep the floor dry. Clean up any water that spills on the floor as soon as it happens.  Remove soap buildup in the tub or shower regularly.  Attach bath mats securely with double-sided non-slip rug tape.  Do not have throw rugs and other things on the floor that can make  you trip. What can I do in the bedroom?  Use night lights.  Make sure that you have a light by your bed that is easy to reach.  Do not use any sheets or blankets that are too big for your bed. They should not hang down onto the floor.  Have a firm chair that has side arms. You can use this for support while you get dressed.  Do not have throw rugs and other things on the floor that can make you trip. What can I do in the kitchen?  Clean up any spills right away.  Avoid walking on wet floors.  Keep items that you use a lot in easy-to-reach places.  If you need to reach something above you, use a strong step stool that has a grab bar.  Keep electrical cords out of the way.  Do not use floor polish or wax that makes floors slippery. If you must use wax, use non-skid floor wax.  Do not have throw rugs and other things on the floor that can make you trip. What can I do with my stairs?  Do not leave any items on the stairs.  Make sure  that there are handrails on both sides of the stairs and use them. Fix handrails that are broken or loose. Make sure that handrails are as long as the stairways.  Check any carpeting to make sure that it is firmly attached to the stairs. Fix any carpet that is loose or worn.  Avoid having throw rugs at the top or bottom of the stairs. If you do have throw rugs, attach them to the floor with carpet tape.  Make sure that you have a light switch at the top of the stairs and the bottom of the stairs. If you do not have them, ask someone to add them for you. What else can I do to help prevent falls?  Wear shoes that:  Do not have high heels.  Have rubber bottoms.  Are comfortable and fit you well.  Are closed at the toe. Do not wear sandals.  If you use a stepladder:  Make sure that it is fully opened. Do not climb a closed stepladder.  Make sure that both sides of the stepladder are locked into place.  Ask someone to hold it for you, if  possible.  Clearly mark and make sure that you can see:  Any grab bars or handrails.  First and last steps.  Where the edge of each step is.  Use tools that help you move around (mobility aids) if they are needed. These include:  Canes.  Walkers.  Scooters.  Crutches.  Turn on the lights when you go into a dark area. Replace any light bulbs as soon as they burn out.  Set up your furniture so you have a clear path. Avoid moving your furniture around.  If any of your floors are uneven, fix them.  If there are any pets around you, be aware of where they are.  Review your medicines with your doctor. Some medicines can make you feel dizzy. This can increase your chance of falling. Ask your doctor what other things that you can do to help prevent falls. This information is not intended to replace advice given to you by your health care provider. Make sure you discuss any questions you have with your health care provider. Document Released: 12/14/2008 Document Revised: 07/26/2015 Document Reviewed: 03/24/2014 Elsevier Interactive Patient Education  2017 [employer].

## 2018-02-03 NOTE — ACP (Advance Care Planning) (Signed)
Met with patient and reviewed his MOST form for the year.  His wishes have not changed.  He requests the following:  DNR, limited additional interventions--agreeable to hospitalization at this time as he is walking, active, cognitively intact and doing quite well so would expect recovery.  He says he may not feel that way in 5 years.  He also requests short term antibiotics and fluids, but no tube feeding.  He asks if I agree with his selections and I expressed that he made good choices.  Form was updated and copies made for scanning and for [facility] nurse.    20 mins spent on acp.

## 2018-02-15 ENCOUNTER — Telehealth: Payer: Self-pay | Admitting: *Deleted

## 2018-02-15 NOTE — Telephone Encounter (Signed)
[facility] Patient called and stated that he is going out of town for the [place] on Saturday but he wanted to get checked out before he went due to Cough, Congestion and a runny nose for about a week now. No Fever. Has not been around anyone sick. Please Advise.   No Available appointment.

## 2018-02-15 NOTE — Telephone Encounter (Signed)
It sounds like a viral upper respiratory infection.  He may use otc robitussin dm cough syrup for the cough.  He may also use warm humidity with a humidifier or vaporizer to help with nasal congestion.  Nasal saline may also help with congestion.  If he develops fever or persistent deep cough, he will need evaluation.

## 2018-02-15 NOTE — Telephone Encounter (Signed)
LMOM to return call.

## 2018-02-16 NOTE — Telephone Encounter (Signed)
Patient notified and agreed.  

## 2018-08-25 ENCOUNTER — Other Ambulatory Visit: Payer: Self-pay | Admitting: Internal Medicine

## 2018-09-01 ENCOUNTER — Telehealth: Payer: Self-pay | Admitting: Internal Medicine

## 2018-09-01 NOTE — Telephone Encounter (Signed)
Patient left message on my voicemail asking if he could come to office for viral test. He did not state what type of viral test.

## 2018-09-02 NOTE — Telephone Encounter (Signed)
Pt called back asking if he could get the COVID-19 test, pt not having symptoms, he wanted to be tested for his own curiosity, I advised to pt we are only doing testing if exposure or having symptoms. Pt understood

## 2018-12-15 ENCOUNTER — Encounter: Payer: Self-pay | Admitting: Internal Medicine

## 2018-12-15 ENCOUNTER — Other Ambulatory Visit: Payer: Self-pay

## 2018-12-15 ENCOUNTER — Non-Acute Institutional Stay (INDEPENDENT_AMBULATORY_CARE_PROVIDER_SITE_OTHER): Payer: Medicare Other | Admitting: Internal Medicine

## 2018-12-15 VITALS — BP 138/86 | HR 78 | Temp 97.8°F | Ht 71.0 in | Wt 157.2 lb

## 2018-12-15 DIAGNOSIS — Z7189 Other specified counseling: Secondary | ICD-10-CM

## 2018-12-15 DIAGNOSIS — I358 Other nonrheumatic aortic valve disorders: Secondary | ICD-10-CM | POA: Diagnosis not present

## 2018-12-15 DIAGNOSIS — E559 Vitamin D deficiency, unspecified: Secondary | ICD-10-CM

## 2018-12-15 DIAGNOSIS — Z0000 Encounter for general adult medical examination without abnormal findings: Secondary | ICD-10-CM | POA: Diagnosis not present

## 2018-12-15 NOTE — Progress Notes (Signed)
Provider:  Rexene Edison. Mariea Clonts, D.O., C.M.D. Location:  Occupational psychologist of Service:  Clinic (12)  Previous PCP: Gayland Curry, DO Patient Care Team: Gayland Curry, DO as PCP - General (Geriatric Medicine) [facility] [name], MD as Consulting Physician (Ophthalmology)  Extended Emergency Contact Information Primary Emergency Contact: [place] Phone: [phone] Relation: Daughter  Code Status: DNR, MOST--reviewed and signed today--copy made to be scanned in vynca--no changes confirmed  Goals of Care: Advanced Directive information Advanced Directives 02/03/2018  Does Patient Have a Medical Advance Directive? Yes  Type of Paramedic of [place];Living will;Out of facility DNR (pink MOST or yellow form)  Does patient want to make changes to medical advance directive? No - Patient declined  Copy of [place] in Chart? Yes - validated most recent copy scanned in chart (See row information)  Pre-existing out of facility DNR order (yellow form or pink MOST form) Yellow form placed in chart (order not valid for inpatient use);Pink MOST form placed in chart (order not valid for inpatient use)   Chief Complaint  Patient presents with  . Annual Exam    CPE    HPI: Patient is a 85 y.o. male seen today for an annual physical exam.    He finds where he "chokes up and sneezes" at times.  It often occurs after a meal.  He will cough up, blow his nose, cough, sneeze and then it goes away.  It's not faithfully.   He has a sensation of feeling cold in his legs, it creeps up into his head before he coughs and sneezes.  Happening for several years, but more noticeably now.  No difficulty swallowing his food or drink.  He's not noticing it just with spicy foods or anything.  It never happens at breakfast.  Not always associated with meals.   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  He no longer sees straight lines.  One eye sees things crooked.   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    He still does chairfit 2.  He is doing the outdoor version.  It will move inside under social distance considerations in November.  We reviewed his MOST.  No changes.  He had his flu shot here with the others in the pharmacy flu clinic for [employer] residents.    Past Medical History:  Diagnosis Date  . Actinic keratosis 04/05/2012  . Insomnia, unspecified 06/08/2008  . Lumbago 01/02/2003  . 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 Pain in limb 02/05/2011  . Retinal detachment 2000   left eye  . Screening cholesterol level   . Shortness of breath 09/07/2008  . Vitamin D deficiency 08/04/2012   Past Surgical History:  Procedure Laterality Date  . EXTERNAL EAR SURGERY  05/2012   left ear   . INGUINAL HERNIA REPAIR Right 01/21/2005   Dr. [name]  . LID LESION EXCISION  2009  . MOHS SURGERY Left 05/28/2012   ear lobe Dr. [name]  . [facility]    reports that he has never smoked. He has never used smokeless tobacco. He reports current alcohol use of about 7.0 standard drinks of alcohol per week. He reports that he does not use drugs.  Functional Status Survey:    Family History  Problem Relation Age of Onset  . Cancer Mother        breast    Health Maintenance  Topic Date Due  . TETANUS/TDAP  12/11/2027  . INFLUENZA VACCINE  Completed  . PNA vac Low Risk Adult  Completed    Allergies  Allergen Reactions  . Benzalkonium Chloride Rash    Very allergic per patient   . Maxitrol [Neomycin-Polymyxin-Dexameth] Itching and Rash  . Neosporin [Neomycin-Bacitracin Zn-Polymyx] Itching and Rash    Outpatient Encounter Medications as of 12/15/2018  Medication Sig  . Vitamin D, Ergocalciferol, (DRISDOL) 1.25 MG (50000 UT) CAPS capsule TAKE  ONE CAPSULE ONCE A WEEK FOR VITAMIN D SUPPLEMENT.   No facility-administered encounter medications on file as of 12/15/2018.     Review of Systems  Constitutional: Negative for chills, fever and malaise/fatigue.  HENT: Negative for congestion and hearing loss.        Cough, sneeze, chills episodes as in hpi  Eyes: Negative for blurred vision.  Respiratory: Negative for cough and shortness of breath.   Cardiovascular: Negative for chest pain, palpitations and leg swelling.  Gastrointestinal: Negative for abdominal pain, blood in stool, constipation, diarrhea and melena.  Genitourinary: Negative for dysuria, frequency and urgency.  Musculoskeletal: Negative for back pain, falls and joint pain.  Skin: Negative for itching and rash.  Neurological: Negative for dizziness and loss of consciousness.  Endo/Heme/Allergies: Bruises/bleeds easily.  Psychiatric/Behavioral: Negative for depression and memory loss. The patient is not nervous/anxious and does not have insomnia.     Vitals:   12/15/18 1432  BP: 138/86  Pulse: 78  Temp: 97.8 F (36.6 C)  TempSrc: Oral  SpO2: 96%  Weight: 157 lb 3.2 oz (71.3 kg)  Height: 5\' 11"  (1.803 m)   Body mass index is 21.92 kg/m. Physical Exam Vitals signs reviewed.  Constitutional:      General: He is not in acute distress.    Appearance: Normal appearance. He is not ill-appearing, toxic-appearing or diaphoretic.     Comments: Tall thin male  HENT:     Head: Normocephalic and atraumatic.     Right Ear: Tympanic membrane, ear canal and external ear normal.     Left Ear: Tympanic membrane, ear canal and external ear normal.     Mouth/Throat:     Mouth: Mucous membranes are moist.     Pharynx: Oropharynx is clear.  Eyes:     Extraocular Movements: Extraocular movements intact.     Conjunctiva/sclera: Conjunctivae normal.     Pupils: Pupils are equal, round, and reactive to light.     Comments: Hyperpigmentation of right medial canthus area    Neck:     Musculoskeletal: Neck supple. No muscular tenderness.  Cardiovascular:     Rate and Rhythm: Normal rate and regular rhythm.     Pulses: Normal pulses.     Heart sounds: Murmur present.     Comments: Murmur loudest second ICS on right Pulmonary:     Effort: Pulmonary effort is normal.     Breath sounds: Normal breath sounds.  Abdominal:     General: Bowel sounds are normal. There is no distension.     Palpations: Abdomen is soft.     Tenderness: There is no abdominal tenderness. There is no guarding or rebound.  Musculoskeletal: Normal range of motion.     Right lower leg: No edema.     Left lower leg: No edema.  Lymphadenopathy:     Cervical: No cervical adenopathy.  Skin:  General: Skin is warm and dry.     Capillary Refill: Capillary refill takes less than 2 seconds.     Findings: No bruising.  Neurological:     General: No focal deficit present.     Mental Status: He is alert and oriented to person, place, and time. Mental status is at baseline.     Cranial Nerves: No cranial nerve deficit.     Sensory: No sensory deficit.     Motor: No weakness.     Coordination: Coordination normal.     Gait: Gait normal.     Deep Tendon Reflexes: Reflexes normal.  Psychiatric:        Mood and Affect: Mood normal.        Behavior: Behavior normal.        Thought Content: Thought content normal.        Judgment: Judgment normal.     Labs reviewed: Basic Metabolic Panel: No results for input(s): NA, K, CL, CO2, GLUCOSE, BUN, CREATININE, CALCIUM, MG, PHOS in the last 8760 hours. Liver Function Tests: No results for input(s): AST, ALT, ALKPHOS, BILITOT, PROT, ALBUMIN in the last 8760 hours. No results for input(s): LIPASE, AMYLASE in the last 8760 hours. No results for input(s): AMMONIA in the last 8760 hours. CBC: No results for input(s): WBC, NEUTROABS, HGB, HCT, MCV, PLT in the last 8760 hours. Cardiac Enzymes: No results for input(s): CKTOTAL, CKMB, CKMBINDEX,  TROPONINI in the last 8760 hours. BNP: Invalid input(s): POCBNP Lab Results  Component Value Date   HGBA1C 5.9 03/09/2012   No results found for: TSH No results found for: VITAMINB12 No results found for: FOLATE No results found for: IRON, TIBC, FERRITIN  Imaging and Procedures Recently: No new--old echo reviewed  Assessment/Plan 1. Annual physical exam -performed today -had his flu vaccine during [place] flu clinic thru [facility] here  2. Vitamin D deficiency -need level to determine if must continue on high dose vitamin D  3. Aortic systolic murmur on examination -remains benign and asymptomatic  4. ACP (advance care planning) -20 mins spent reviewing and signing updated MOST form--copy made to be scanned in vynca  Labs/tests ordered:  Annual labs ordered for am F/u in 1 year with fasting labs before that [place]. Timberlee Roblero, D.O. [facility] [address], [place] [address] Cell Phone (Mon-Fri 8am-5pm):  [phone] On Call:  [phone] & follow prompts after 5pm & weekends Office Phone:  [phone] Office Fax:  [phone]

## 2018-12-16 LAB — LIPID PANEL
Cholesterol: 189 (ref 0–200)
HDL: 69 (ref 35–70)
LDL Cholesterol: 111
Triglycerides: 45 (ref 40–160)

## 2018-12-16 LAB — HEPATIC FUNCTION PANEL
ALT: 10 (ref 10–40)
AST: 16 (ref 14–40)
Alkaline Phosphatase: 57 (ref 25–125)
Bilirubin, Total: 0.5

## 2018-12-16 LAB — BASIC METABOLIC PANEL
BUN: 15 (ref 4–21)
Creatinine: 0.8 (ref 0.6–1.3)
Glucose: 83
Potassium: 4.3 (ref 3.4–5.3)
Sodium: 136 — AB (ref 137–147)

## 2018-12-16 LAB — CBC AND DIFFERENTIAL
HCT: 39 — AB (ref 41–53)
Hemoglobin: 13.1 — AB (ref 13.5–17.5)
Platelets: 254 (ref 150–399)
WBC: 3.9

## 2018-12-16 LAB — VITAMIN D 25 HYDROXY (VIT D DEFICIENCY, FRACTURES): Vit D, 25-Hydroxy: 41.19

## 2018-12-22 ENCOUNTER — Telehealth: Payer: Self-pay

## 2018-12-22 NOTE — Telephone Encounter (Signed)
Per Dr.Reed:  1.) Mild Anemia 2.) Bad Cholesterol slightly elevated  #.) Vit D level normal, continue Vit d supplement  Discussed results with patient, patient verbalized understanding of results. Copy of labs to be mailed to patient and sent for scanning.  Patient would like it know in the future he would like labs prior to seeing Dr.Reed to discuss at appointment (face-to-face).

## 2018-12-30 ENCOUNTER — Telehealth: Payer: Self-pay | Admitting: Internal Medicine

## 2018-12-30 ENCOUNTER — Other Ambulatory Visit: Payer: Self-pay | Admitting: Internal Medicine

## 2018-12-30 NOTE — Telephone Encounter (Signed)
Clinic nurse called with concern that Alec Weiss had a fall earlier today due to dizziness.  He has a bruise on his arm.  He reports that he took a nap due to feeling dizzy and then when he got up, he still felt dizzy this afternoon so he pulled his cord.  RN did her assessment and BP was 160/82 when typically in [id number] systolic.  BP has never been an issue and pt only on vitamin D.  Neuro checks were negative with full strength and ROM.  Other vitals all normal.  He feels like his head is heavy and legs are weak.  No speech or visual changes.  Gait was unsteady.  I advised for RN to obtain EKG and send me results.  He denied the room spinning.   He has a benign aortic murmur that was just assessed by echo last year.

## 2018-12-31 NOTE — Telephone Encounter (Signed)
EKG done.  NSR at 64bpm, poor R wave progression, V4 only with ST elevation, but must be lead placement as all others normal.  He's had no chest pain, shortness of breath.  Only symptom has been dizziness and weakness of his legs.  Clinic nurse agreed we would continue to monitor him.    Mosie Angus L. Rondi Ivy, D.O. [facility] [address], [place] [address] Cell Phone (Mon-Fri 8am-5pm):  [phone] On Call:  [phone] & follow prompts after 5pm & weekends Office Phone:  [phone] Office Fax:  [phone]

## 2019-01-12 ENCOUNTER — Encounter: Payer: Self-pay | Admitting: Internal Medicine

## 2019-01-12 ENCOUNTER — Non-Acute Institutional Stay: Payer: Medicare Other | Admitting: Internal Medicine

## 2019-01-12 ENCOUNTER — Other Ambulatory Visit: Payer: Self-pay

## 2019-01-12 VITALS — BP 152/88 | HR 69 | Temp 97.7°F | Ht 71.0 in | Wt 153.4 lb

## 2019-01-12 DIAGNOSIS — I10 Essential (primary) hypertension: Secondary | ICD-10-CM | POA: Diagnosis not present

## 2019-01-12 DIAGNOSIS — H8112 Benign paroxysmal vertigo, left ear: Secondary | ICD-10-CM

## 2019-01-12 DIAGNOSIS — W19XXXA Unspecified fall, initial encounter: Secondary | ICD-10-CM | POA: Diagnosis not present

## 2019-01-12 NOTE — Progress Notes (Signed)
Location:  [place] of Service:  Clinic (12)  Provider: Azaiah Mello L. Mariea Clonts, D.O., C.M.D.  Code Status: DNR, MOST:  DNR, limited additional interventions, determine/limit abx when infection occurs, IVFs for defined period, no feeding tube  Goals of Care:  Advanced Directives 02/03/2018  Does Patient Have a Medical Advance Directive? Yes  Type of Paramedic of [place];Living will;Out of facility DNR (pink MOST or yellow form)  Does patient want to make changes to medical advance directive? No - Patient declined  Copy of [place] in Chart? Yes - validated most recent copy scanned in chart (See row information)  Pre-existing out of facility DNR order (yellow form or pink MOST form) Yellow form placed in chart (order not valid for inpatient use);Pink MOST form placed in chart (order not valid for inpatient use)     Chief Complaint  Patient presents with  . Acute Visit    Complains of Elevated Blood Pressure    HPI: Patient is a 85 y.o. male seen today for an acute visit for two episodes of vertigo and elevated bp.  He found this was remedied by hydration and eating more.  He was very weak in the legs, but no asymmetric weakness, numbness or tingling.  Walking downstairs is hardest for his balance.  Blood pressure was elevated during the first episode.  EKG was done first time which showed no acute ischemia or infarct.    He notes his bp has been elevated during these two episodes and again this afternoon when it typically has not been.    Past Medical History:  Diagnosis Date  . Actinic keratosis 04/05/2012  . Insomnia, unspecified 06/08/2008  . Lumbago 01/02/2003  . 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 Pain in limb 02/05/2011  . Retinal detachment 2000   left eye  . Screening cholesterol level   . Shortness of breath 09/07/2008  . Vitamin D deficiency 08/04/2012    Past Surgical History:  Procedure Laterality Date  .  EXTERNAL EAR SURGERY  05/2012   left ear   . INGUINAL HERNIA REPAIR Right 01/21/2005   Dr. [name]  . LID LESION EXCISION  2009  . MOHS SURGERY Left 05/28/2012   ear lobe Dr. [name]  . RETINAL DETACHMENT SURGERY Left [facility]    Allergies  Allergen Reactions  . Benzalkonium Chloride Rash    Very allergic per patient   . Maxitrol [Neomycin-Polymyxin-Dexameth] Itching and Rash  . Neosporin [Neomycin-Bacitracin Zn-Polymyx] Itching and Rash    Outpatient Encounter Medications as of 01/12/2019  Medication Sig  . Vitamin D, Ergocalciferol, (DRISDOL) 1.25 MG (50000 UT) CAPS capsule TAKE ONE CAPSULE ONCE A WEEK FOR VITAMIN D SUPPLEMENT.   No facility-administered encounter medications on file as of 01/12/2019.     Review of Systems:  Review of Systems  Constitutional: Negative for chills, fever and malaise/fatigue.  HENT: Negative for hearing loss.   Eyes: Negative for blurred vision.  Respiratory: Negative for shortness of breath.   Cardiovascular: Negative for chest pain, palpitations and leg swelling.  Gastrointestinal: Negative for abdominal pain, constipation, diarrhea, nausea and vomiting.  Genitourinary: Negative for dysuria.  Musculoskeletal: Positive for falls. Negative for joint pain.  Skin: Negative for itching and rash.  Neurological: Positive for dizziness and weakness. Negative for sensory change, speech change, focal weakness, loss of consciousness and headaches.  Endo/Heme/Allergies: Bruises/bleeds easily.  Psychiatric/Behavioral: Negative for depression and memory loss. The patient is not  nervous/anxious and does not have insomnia.     Health Maintenance  Topic Date Due  . TETANUS/TDAP  12/11/2027  . INFLUENZA VACCINE  Completed  . PNA vac Low Risk Adult  Completed    Physical Exam: Vitals:   01/12/19 1504  BP: (!) 152/88  Pulse: 69  Temp: 97.7 F (36.5 C)  TempSrc: Oral  SpO2: 98%  Weight: 153 lb 6.4 oz (69.6 kg)  Height: 5\' 11"  (1.803 m)    Body mass index is 21.39 kg/m. Physical Exam Vitals signs and nursing note reviewed.  Constitutional:      General: He is not in acute distress.    Appearance: Normal appearance. He is not toxic-appearing.  HENT:     Head: Normocephalic and atraumatic.     Right Ear: Tympanic membrane, ear canal and external ear normal. There is no impacted cerumen.     Left Ear: Tympanic membrane normal.     Ears:     Comments: Cerumen in left canal but not obstructive Eyes:     Extraocular Movements: Extraocular movements intact.     Pupils: Pupils are equal, round, and reactive to light.  Cardiovascular:     Rate and Rhythm: Normal rate and regular rhythm.     Pulses: Normal pulses.     Heart sounds: Murmur present.  Pulmonary:     Effort: Pulmonary effort is normal.     Breath sounds: Normal breath sounds.  Musculoskeletal:     Right lower leg: No edema.     Left lower leg: No edema.  Skin:    General: Skin is warm and dry.  Neurological:     General: No focal deficit present.     Mental Status: He is alert and oriented to person, place, and time.     Cranial Nerves: No cranial nerve deficit.     Sensory: No sensory deficit.     Motor: No weakness.     Coordination: Coordination normal.     Gait: Gait abnormal.     Deep Tendon Reflexes: Reflexes normal.     Comments: Unsteady on standing; left-sided nystagmus; 5/5 strength in extremities; does lean when walking, but romberg negative and cerebellar tests normal  Psychiatric:        Mood and Affect: Mood normal.        Behavior: Behavior normal.        Thought Content: Thought content normal.        Judgment: Judgment normal.     Labs reviewed: Basic Metabolic Panel: Recent Labs    12/16/18  NA 136*  K 4.3  BUN 15  CREATININE 0.8   Liver Function Tests: Recent Labs    12/16/18  AST 16  ALT 10  ALKPHOS 57   No results for input(s): LIPASE, AMYLASE in the last 8760 hours. No results for input(s): AMMONIA in the last  8760 hours. CBC: Recent Labs    12/16/18  WBC 3.9  HGB 13.1*  HCT 39*  PLT 254   Lipid Panel: Recent Labs    12/16/18  CHOL 189  HDL 69  LDLCALC 111  TRIG 45   Lab Results  Component Value Date   HGBA1C 5.9 03/09/2012    Procedures since last visit: No results found.  Assessment/Plan 1. Benign paroxysmal positional vertigo of left ear -referred to PT for management -encouraged hydration and adequate po intake of food, as well -encouraged slow positional changes  2. Essential hypertension -seems this is new since his vertigo -I  did not immediately treat this as it's unusual for him and I'm concerned I'll cause lightheadedness on top of vertigo and he will fall again -if does persist over 140/90, will add medication  3.  Fall -amid his first vertigo episode -hydrate, change positions slowly and do PT for vertigo  Labs/tests ordered:  No new Next appt:  02/08/2019  Monti Jilek L. Felma Pfefferle, D.O. [facility] [address], [place] [address] Cell Phone (Mon-Fri 8am-5pm):  [phone] On Call:  [phone] & follow prompts after 5pm & weekends Office Phone:  [phone] Office Fax:  [phone]

## 2019-02-03 ENCOUNTER — Encounter: Payer: Self-pay | Admitting: Internal Medicine

## 2019-02-08 ENCOUNTER — Other Ambulatory Visit: Payer: Self-pay

## 2019-02-08 ENCOUNTER — Ambulatory Visit (INDEPENDENT_AMBULATORY_CARE_PROVIDER_SITE_OTHER): Payer: Medicare Other | Admitting: Nurse Practitioner

## 2019-02-08 ENCOUNTER — Encounter: Payer: Self-pay | Admitting: Nurse Practitioner

## 2019-02-08 VITALS — Ht 71.0 in | Wt 153.4 lb

## 2019-02-08 DIAGNOSIS — Z0000 Encounter for general adult medical examination without abnormal findings: Secondary | ICD-10-CM

## 2019-02-08 NOTE — Progress Notes (Signed)
Subjective:   Alec Weiss is a 85 y.o. male who presents for Medicare Annual/Subsequent preventive examination.  Review of Systems:   Cardiac Risk Factors include: advanced age (>46men, >72 women);male gender     Objective:    Vitals: Ht 5\' 11"  (1.803 m)   Wt 153 lb 6.4 oz (69.6 kg)   BMI 21.39 kg/m   Body mass index is 21.39 kg/m.  Advanced Directives 02/08/2019 02/03/2018 12/09/2017 12/03/2016 05/13/2016 11/28/2015 06/06/2015  Does Patient Have a Medical Advance Directive? Yes Yes Yes Yes No Yes Yes  Type of Advance Directive Out of facility DNR (pink MOST or yellow form) [place];Living will;Out of facility DNR (pink MOST or yellow form) [place];Living will;Out of facility DNR (pink MOST or yellow form) Living will;Out of facility DNR (pink MOST or yellow form) - Out of facility DNR (pink MOST or yellow form) Out of facility DNR (pink MOST or yellow form)  Does patient want to make changes to medical advance directive? No - Patient declined No - Patient declined No - Patient declined - - - No - Patient declined  Copy of [place] in Chart? - Yes - validated most recent copy scanned in chart (See row information) Yes - - Yes Yes  Pre-existing out of facility DNR order (yellow form or pink MOST form) - Yellow form placed in chart (order not valid for inpatient use);Pink MOST form placed in chart (order not valid for inpatient use) Yellow form placed in chart (order not valid for inpatient use);Pink MOST form placed in chart (order not valid for inpatient use) Yellow form placed in chart (order not valid for inpatient use) - Yellow form placed in chart (order not valid for inpatient use) -    Tobacco Social History   Tobacco Use  Smoking Status Never Smoker  Smokeless Tobacco Never Used     Counseling given: Not Answered   Clinical Intake:  Pre-visit preparation completed: Yes  Pain : No/denies pain     BMI -  recorded: 21.39 Nutritional Status: BMI of 19-24  Normal Nutritional Risks: None Diabetes: No  How often do you need to have someone help you when you read instructions, pamphlets, or other written materials from your doctor or pharmacy?: 1 - Never What is the last grade level you completed in school?: doctorate        Past Medical History:  Diagnosis Date  . Actinic keratosis 04/05/2012  . Insomnia, unspecified 06/08/2008  . Lumbago 01/02/2003  . 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 Pain in limb 02/05/2011  . Retinal detachment 2000   left eye  . Screening cholesterol level   . Shortness of breath 09/07/2008  . Vitamin D deficiency 08/04/2012   Past Surgical History:  Procedure Laterality Date  . EXTERNAL EAR SURGERY  05/2012   left ear   . INGUINAL HERNIA REPAIR Right 01/21/2005   Dr. [name]  . LID LESION EXCISION  2009  . MOHS SURGERY Left 05/28/2012   ear lobe Dr. [name]  . RETINAL DETACHMENT SURGERY Left [facility]   Family History  Problem Relation Age of Onset  . Cancer Mother        breast   Social History   Socioeconomic History  . Marital status: Widowed    Spouse name: Not on file  . Number of children: Not on file  . Years of education: Not on file  . Highest education  level: Not on file  Occupational History  . Occupation: retired Hydrologist professor  Social Needs  . Financial resource strain: Not hard at all  . Food insecurity    Worry: Never true    Inability: Never true  . Transportation needs    Medical: No    Non-medical: No  Tobacco Use  . Smoking status: Never Smoker  . Smokeless tobacco: Never Used  Substance and Sexual Activity  . Alcohol use: Yes    Alcohol/week: 7.0 standard drinks    Types: 7 Glasses of wine per week    Comment: at dinner  . Drug use: No  . Sexual activity: Not on file  Lifestyle  . Physical activity    Days per week: 3 days    Minutes per session: 50 min  . Stress: Not at all  Relationships   . Social connections    Talks on phone: More than three times a week    Gets together: More than three times a week    Attends religious service: Never    Active member of club or organization: Yes    Attends meetings of clubs or organizations: More than 4 times per year    Relationship status: Widowed  Other Topics Concern  . Not on file  Social History Narrative   Patient is  Married Alec Weiss) since 1958. Retired professor, Hydrologist. Lves in apartment, Independent Living section at [facility] since 02/2012.    He is primary caregiver for spouse with dementia.   No Smoking history, drinks moderate amount of alcohol    Patient has Advanced planning documents: DNR   Exercise walking - daily ~1 1/2 - 2 miles (~ 1hr daily) as well as [employer] Exercise classes.    Outpatient Encounter Medications as of 02/08/2019  Medication Sig  . Vitamin D, Ergocalciferol, (DRISDOL) 1.25 MG (50000 UT) CAPS capsule TAKE ONE CAPSULE ONCE A WEEK FOR VITAMIN D SUPPLEMENT.   No facility-administered encounter medications on file as of 02/08/2019.     Activities of Daily Living In your present state of health, do you have any difficulty performing the following activities: 02/08/2019  Hearing? N  Vision? N  Difficulty concentrating or making decisions? N  Walking or climbing stairs? N  Dressing or bathing? N  Doing errands, shopping? N  Preparing Food and eating ? N  Using the Toilet? N  In the past six months, have you accidently leaked urine? N  Do you have problems with loss of bowel control? N  Managing your Medications? N  Managing your Finances? N  Housekeeping or managing your Housekeeping? N  Some recent data might be hidden    Patient Care Team: Gayland Curry, DO as PCP - General (Geriatric Medicine) [facility] [name], MD as Consulting Physician (Ophthalmology)   Assessment:   This is a routine wellness examination for Alec Weiss.  Exercise  Activities and Dietary recommendations Current Exercise Habits: Home exercise routine, Type of exercise: walking, Time (Minutes): 60, Frequency (Times/Week): 7, Weekly Exercise (Minutes/Week): 420, Intensity: Mild  Goals    . Patient Stated     To maintain current level of health       Fall Risk Fall Risk  02/08/2019 02/03/2018 12/09/2017 12/03/2016 11/28/2015  Falls in the past year? 1 0 No No No  Number falls in past yr: 0 0 - - -  Comment Fell due to dizziness - - - -  Injury with Fall? 1 0 - - -  Comment Minor scrapes on elbow - - - -   Is the patient's home free of loose throw rugs in walkways, pet beds, electrical cords, etc?   yes      Grab bars in the bathroom? yes      Handrails on the stairs?   no      Adequate lighting?   yes  Timed Get Up and Go Performed: na  Depression Screen PHQ 2/9 Scores 02/03/2018 12/09/2017 12/03/2016 11/28/2015  PHQ - 2 Score 0 0 0 0    Cognitive Function MMSE - Mini Mental State Exam 12/09/2017 12/03/2016 11/28/2015 11/22/2014  Orientation to time 4 5 4 5   Orientation to Place 5 5 5 5   Registration 3 3 3 3   Attention/ Calculation 5 5 5 5   Recall 3 2 3 3   Language- name 2 objects 2 2 2 2   Language- repeat 1 1 1 1   Language- follow 3 step command 3 3 3 3   Language- read & follow direction 1 1 1 1   Write a sentence 1 1 1 1   Copy design 1 1 1 1   Total score 29 29 29 30      6CIT Screen 02/08/2019  What Year? 0 points  What month? 0 points  What time? 0 points  Count back from 20 0 points  Months in reverse 0 points  Repeat phrase 2 points  Total Score 2    Immunization History  Administered Date(s) Administered  . Hepatitis A 01/24/2002  . Hepatitis B 01/24/2002  . IPV 08/18/2001  . Influenza Whole 12/02/2011, 12/31/2012  . Influenza, High Dose Seasonal PF 12/10/2017, 12/10/2018  . Influenza,inj,Quad PF,6+ Mos 12/20/2013  . Influenza-Unspecified 12/21/2014, 12/27/2015, 12/22/2016  . Pneumococcal Conjugate-13 11/22/2014  .  Pneumococcal Polysaccharide-23 12/03/2016  . Tdap 12/10/2017  . Typhoid Inactivated 07/21/2001  . Zoster 02/01/2011    Qualifies for Shingles Vaccine? Yes   Screening Tests Health Maintenance  Topic Date Due  . TETANUS/TDAP  12/11/2027  . INFLUENZA VACCINE  Completed  . PNA vac Low Risk Adult  Completed   Cancer Screenings: Lung: Low Dose CT Chest recommended if Age 50-80 years, 30 pack-year currently smoking OR have quit w/in 15years. Patient does not qualify. Colorectal: aged out  Additional Screenings:  Hepatitis C Screening:aged out      Plan:     I have personally reviewed and noted the following in the patient's chart:   . Medical and social history . Use of alcohol, tobacco or illicit drugs  . Current medications and supplements . Functional ability and status . Nutritional status . Physical activity . Advanced directives . List of other physicians . Hospitalizations, surgeries, and ER visits in previous 12 months . Vitals . Screenings to include cognitive, depression, and falls . Referrals and appointments  In addition, I have reviewed and discussed with patient certain preventive protocols, quality metrics, and best practice recommendations. A written personalized care plan for preventive services as well as general preventive health recommendations were provided to patient.     Lauree Chandler, NP  02/08/2019

## 2019-02-08 NOTE — Progress Notes (Signed)
This service is provided via telemedicine  No vital signs collected/recorded due to the encounter was a telemedicine visit.   Location of patient (ex: home, work):  Home  Patient consents to a telephone visit:  Yes  Location of the provider (ex: office, home):  [facility]  Name of any referring provider:  Hollace Kinnier, DO  Names of all persons participating in the telemedicine service and their role in the encounter:  Bonney Leitz, [place]; Sherrie Mustache, NP; patient  Time spent on call:  9:26  CMA time only

## 2019-02-08 NOTE — Patient Instructions (Signed)
Alec Weiss , Thank you for taking time to come for your Medicare Wellness Visit. I appreciate your ongoing commitment to your health goals. Please review the following plan we discussed and let me know if I can assist you in the future.   Screening recommendations/referrals: Colonoscopy aged out Recommended yearly ophthalmology/optometry visit for glaucoma screening and checkup Recommended yearly dental visit for hygiene and checkup  Vaccinations: Influenza vaccine up to date Pneumococcal vaccine up to date Tdap vaccine up to date Shingles vaccine DUE- to get at local pharmacy   Advanced directives: on file  Conditions/risks identified: none.  Next appointment: 1 year  Preventive Care 59 Years and Older, Male Preventive care refers to lifestyle choices and visits with your health care provider that can promote health and wellness. What does preventive care include?  A yearly physical exam. This is also called an annual well check.  Dental exams once or twice a year.  Routine eye exams. Ask your health care provider how often you should have your eyes checked.  Personal lifestyle choices, including:  Daily care of your teeth and gums.  Regular physical activity.  Eating a healthy diet.  Avoiding tobacco and drug use.  Limiting alcohol use.  Practicing safe sex.  Taking low doses of aspirin every day.  Taking vitamin and mineral supplements as recommended by your health care provider. What happens during an annual well check? The services and screenings done by your health care provider during your annual well check will depend on your age, overall health, lifestyle risk factors, and family history of disease. Counseling  Your health care provider may ask you questions about your:  Alcohol use.  Tobacco use.  Drug use.  Emotional well-being.  Home and relationship well-being.  Sexual activity.  Eating habits.  History of falls.  Memory and ability to  understand (cognition).  Work and work Statistician. Screening  You may have the following tests or measurements:  Height, weight, and BMI.  Blood pressure.  Lipid and cholesterol levels. These may be checked every 5 years, or more frequently if you are over 52 years old.  Skin check.  Lung cancer screening. You may have this screening every year starting at age 42 if you have a 30-pack-year history of smoking and currently smoke or have quit within the past 15 years.  Fecal occult blood test (FOBT) of the stool. You may have this test every year starting at age 83.  Flexible sigmoidoscopy or colonoscopy. You may have a sigmoidoscopy every 5 years or a colonoscopy every 10 years starting at age 25.  Prostate cancer screening. Recommendations will vary depending on your family history and other risks.  Hepatitis C blood test.  Hepatitis B blood test.  Sexually transmitted disease (STD) testing.  Diabetes screening. This is done by checking your blood sugar (glucose) after you have not eaten for a while (fasting). You may have this done every 1-3 years.  Abdominal aortic aneurysm (AAA) screening. You may need this if you are a current or former smoker.  Osteoporosis. You may be screened starting at age 47 if you are at high risk. Talk with your health care provider about your test results, treatment options, and if necessary, the need for more tests. Vaccines  Your health care provider may recommend certain vaccines, such as:  Influenza vaccine. This is recommended every year.  Tetanus, diphtheria, and acellular pertussis (Tdap, Td) vaccine. You may need a Td booster every 10 years.  Zoster vaccine. You may  need this after age 18.  Pneumococcal 13-valent conjugate (PCV13) vaccine. One dose is recommended after age 60.  Pneumococcal polysaccharide (PPSV23) vaccine. One dose is recommended after age 48. Talk to your health care provider about which screenings and vaccines you  need and how often you need them. This information is not intended to replace advice given to you by your health care provider. Make sure you discuss any questions you have with your health care provider. Document Released: 03/16/2015 Document Revised: 11/07/2015 Document Reviewed: 12/19/2014 Elsevier Interactive Patient Education  2017 [employer] Prevention in the Home Falls can cause injuries. They can happen to people of all ages. There are many things you can do to make your home safe and to help prevent falls. What can I do on the outside of my home?  Regularly fix the edges of walkways and driveways and fix any cracks.  Remove anything that might make you trip as you walk through a door, such as a raised step or threshold.  Trim any bushes or trees on the path to your home.  Use bright outdoor lighting.  Clear any walking paths of anything that might make someone trip, such as rocks or tools.  Regularly check to see if handrails are loose or broken. Make sure that both sides of any steps have handrails.  Any raised decks and porches should have guardrails on the edges.  Have any leaves, snow, or ice cleared regularly.  Use sand or salt on walking paths during winter.  Clean up any spills in your garage right away. This includes oil or grease spills. What can I do in the bathroom?  Use night lights.  Install grab bars by the toilet and in the tub and shower. Do not use towel bars as grab bars.  Use non-skid mats or decals in the tub or shower.  If you need to sit down in the shower, use a plastic, non-slip stool.  Keep the floor dry. Clean up any water that spills on the floor as soon as it happens.  Remove soap buildup in the tub or shower regularly.  Attach bath mats securely with double-sided non-slip rug tape.  Do not have throw rugs and other things on the floor that can make you trip. What can I do in the bedroom?  Use night lights.  Make sure  that you have a light by your bed that is easy to reach.  Do not use any sheets or blankets that are too big for your bed. They should not hang down onto the floor.  Have a firm chair that has side arms. You can use this for support while you get dressed.  Do not have throw rugs and other things on the floor that can make you trip. What can I do in the kitchen?  Clean up any spills right away.  Avoid walking on wet floors.  Keep items that you use a lot in easy-to-reach places.  If you need to reach something above you, use a strong step stool that has a grab bar.  Keep electrical cords out of the way.  Do not use floor polish or wax that makes floors slippery. If you must use wax, use non-skid floor wax.  Do not have throw rugs and other things on the floor that can make you trip. What can I do with my stairs?  Do not leave any items on the stairs.  Make sure that there are handrails on both sides  of the stairs and use them. Fix handrails that are broken or loose. Make sure that handrails are as long as the stairways.  Check any carpeting to make sure that it is firmly attached to the stairs. Fix any carpet that is loose or worn.  Avoid having throw rugs at the top or bottom of the stairs. If you do have throw rugs, attach them to the floor with carpet tape.  Make sure that you have a light switch at the top of the stairs and the bottom of the stairs. If you do not have them, ask someone to add them for you. What else can I do to help prevent falls?  Wear shoes that:  Do not have high heels.  Have rubber bottoms.  Are comfortable and fit you well.  Are closed at the toe. Do not wear sandals.  If you use a stepladder:  Make sure that it is fully opened. Do not climb a closed stepladder.  Make sure that both sides of the stepladder are locked into place.  Ask someone to hold it for you, if possible.  Clearly mark and make sure that you can see:  Any grab bars or  handrails.  First and last steps.  Where the edge of each step is.  Use tools that help you move around (mobility aids) if they are needed. These include:  Canes.  Walkers.  Scooters.  Crutches.  Turn on the lights when you go into a dark area. Replace any light bulbs as soon as they burn out.  Set up your furniture so you have a clear path. Avoid moving your furniture around.  If any of your floors are uneven, fix them.  If there are any pets around you, be aware of where they are.  Review your medicines with your doctor. Some medicines can make you feel dizzy. This can increase your chance of falling. Ask your doctor what other things that you can do to help prevent falls. This information is not intended to replace advice given to you by your health care provider. Make sure you discuss any questions you have with your health care provider. Document Released: 12/14/2008 Document Revised: 07/26/2015 Document Reviewed: 03/24/2014 Elsevier Interactive Patient Education  2017 [employer].

## 2019-04-12 DIAGNOSIS — H40012 Open angle with borderline findings, low risk, left eye: Secondary | ICD-10-CM | POA: Diagnosis not present

## 2019-04-12 DIAGNOSIS — H401212 Low-tension glaucoma, right eye, moderate stage: Secondary | ICD-10-CM | POA: Diagnosis not present

## 2019-04-19 ENCOUNTER — Other Ambulatory Visit: Payer: Self-pay | Admitting: Internal Medicine

## 2019-07-25 ENCOUNTER — Ambulatory Visit (INDEPENDENT_AMBULATORY_CARE_PROVIDER_SITE_OTHER): Payer: Medicare PPO | Admitting: Family

## 2019-07-25 ENCOUNTER — Other Ambulatory Visit: Payer: Self-pay

## 2019-07-25 ENCOUNTER — Encounter: Payer: Self-pay | Admitting: Family

## 2019-07-25 VITALS — BP 130/86 | HR 76 | Temp 97.5°F | Resp 16 | Ht 71.0 in | Wt 159.4 lb

## 2019-07-25 DIAGNOSIS — S81812A Laceration without foreign body, left lower leg, initial encounter: Secondary | ICD-10-CM

## 2019-07-25 DIAGNOSIS — I83893 Varicose veins of bilateral lower extremities with other complications: Secondary | ICD-10-CM | POA: Diagnosis not present

## 2019-07-25 NOTE — Progress Notes (Signed)
Provider: Artrell Lawless FNP-C  Gayland Curry, DO  Patient Care Team: Gayland Curry, DO as PCP - General (Geriatric Medicine) [facility] [name], MD as Consulting Physician (Ophthalmology)  Extended Emergency Contact Information Primary Emergency Contact: Torrey of [place] Phone: [phone] Relation: Daughter  Code Status:  DNR Goals of care: Advanced Directive information Advanced Directives 07/25/2019  Does Patient Have a Medical Advance Directive? Yes  Type of Advance Directive Living will;Out of facility DNR (pink MOST or yellow form)  Does patient want to make changes to medical advance directive? No - Patient declined  Copy of [place] in Chart? Yes - validated most recent copy scanned in chart (See row information)  Pre-existing out of facility DNR order (yellow form or pink MOST form) -     Chief Complaint  Patient presents with  . Acute Visit    Cut on Left Leg.    HPI:  Pt is a 86 y.o. male seen today for an acute visit for evaluation of left leg skin tear for one and a half week.He states run onto a chair.He resides at independent living facility at [facility]States facility Nurse was concerned wound was not healing and has lots of drainage.Otherwise he states feeling good.He denies any fever,chills,redness or odor from the wound.Has Appetite is good.   Past Medical History:  Diagnosis Date  . Actinic keratosis 04/05/2012  . Insomnia, unspecified 06/08/2008  . Lumbago 01/02/2003  . 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 Pain in limb 02/05/2011  . Retinal detachment 2000   left eye  . Screening cholesterol level   . Shortness of breath 09/07/2008  . Vitamin D deficiency 08/04/2012   Past Surgical History:  Procedure Laterality Date  . EXTERNAL EAR SURGERY  05/2012   left ear   . INGUINAL HERNIA REPAIR Right 01/21/2005   Dr. [name]  . LID LESION EXCISION  2009  .  MOHS SURGERY Left 05/28/2012   ear lobe Dr. [name]  . RETINAL DETACHMENT SURGERY Left [facility]    Allergies  Allergen Reactions  . Benzalkonium Chloride Rash    Very allergic per patient   . Maxitrol [Neomycin-Polymyxin-Dexameth] Itching and Rash  . Neosporin [Neomycin-Bacitracin Zn-Polymyx] Itching and Rash    Outpatient Encounter Medications as of 07/25/2019  Medication Sig  . Vitamin D, Ergocalciferol, (DRISDOL) 1.25 MG (50000 UNIT) CAPS capsule TAKE ONE CAPSULE ONCE A WEEK FOR VITAMIN D SUPPLEMENT.   No facility-administered encounter medications on file as of 07/25/2019.    Review of Systems  Constitutional: Negative for appetite change, chills, fatigue and fever.  Respiratory: Negative for cough, chest tightness, shortness of breath and wheezing.   Skin: Negative for color change, pallor and rash.  Neurological: Negative for dizziness, speech difficulty, weakness, light-headedness and headaches.  Psychiatric/Behavioral: Negative for agitation, confusion and sleep disturbance.    Immunization History  Administered Date(s) Administered  . Hepatitis A 01/24/2002  . Hepatitis B 01/24/2002  . IPV 08/18/2001  . Influenza Whole 12/02/2011, 12/31/2012  . Influenza, High Dose Seasonal PF 12/10/2017, 12/10/2018  . Influenza,inj,Quad PF,6+ Mos 12/20/2013  . Influenza-Unspecified 12/21/2014, 12/27/2015, 12/22/2016  . Pneumococcal Conjugate-13 11/22/2014  . Pneumococcal Polysaccharide-23 12/03/2016  . Tdap 12/10/2017  . Typhoid Inactivated 07/21/2001  . Zoster 02/01/2011   Pertinent  Health Maintenance Due  Topic Date Due  . INFLUENZA VACCINE  10/02/2019  . PNA vac Low Risk Adult  Completed   Fall Risk  07/25/2019 02/08/2019 02/03/2018 12/09/2017 12/03/2016  Falls in the past year? 0 1 0 No No  Number falls in past yr: 0 0 0 - -  Comment - Fell due to dizziness - - -  Injury with Fall? 0 1 0 - -  Comment - Minor scrapes on elbow - - -      Vitals:   07/25/19  1547  BP: 130/86  Pulse: 76  Resp: 16  Temp: (!) 97.5 F (36.4 C)  SpO2: 96%  Weight: 159 lb 6.4 oz (72.3 kg)  Height: 5\' 11"  (1.803 m)   Body mass index is 22.23 kg/m. Physical Exam Vitals reviewed.  Constitutional:      General: He is not in acute distress.    Appearance: He is normal weight. He is not ill-appearing.  Eyes:     General: No scleral icterus.       Right eye: No discharge.        Left eye: No discharge.     Extraocular Movements: Extraocular movements intact.     Conjunctiva/sclera: Conjunctivae normal.     Pupils: Pupils are equal, round, and reactive to light.  Cardiovascular:     Rate and Rhythm: Normal rate and regular rhythm.     Pulses: Normal pulses.     Heart sounds: Murmur present. No friction rub. No gallop.      Comments: Bilateral leg Varicose vein  Pulmonary:     Effort: Pulmonary effort is normal. No respiratory distress.     Breath sounds: Normal breath sounds. No wheezing, rhonchi or rales.  Chest:     Chest wall: No tenderness.  Musculoskeletal:        General: No swelling or tenderness.     Right lower leg: Edema present.     Left lower leg: Edema present.     Comments: Wide gait.bilateral lower extremities pitting edema.  Skin:    General: Skin is warm.     Coloration: Skin is not pale.     Findings: No bruising, erythema or rash.          Comments: Wound cleanse with saline,pat dry and covered with foam dressing for drainage absorption.  Neurological:     Mental Status: He is alert and oriented to person, place, and time.     Cranial Nerves: No cranial nerve deficit.     Sensory: No sensory deficit.     Motor: No weakness.  Psychiatric:        Mood and Affect: Mood normal.        Behavior: Behavior normal.        Thought Content: Thought content normal.        Judgment: Judgment normal.    Labs reviewed: Recent Labs    12/16/18 0000  NA 136*  K 4.3  BUN 15  CREATININE 0.8   Recent Labs    12/16/18 0000  AST 16   ALT 10  ALKPHOS 57   Recent Labs    12/16/18 0000  WBC 3.9  HGB 13.1*  HCT 39*  PLT 254   No results found for: TSH Lab Results  Component Value Date   HGBA1C 5.9 03/09/2012   Lab Results  Component Value Date   CHOL 189 12/16/2018   HDL 69 12/16/2018   LDLCALC 111 12/16/2018   TRIG 45 12/16/2018    Significant Diagnostic Results in last 30 days:  No results found.  Assessment/Plan 1. Noninfected skin tear of left lower extremity, initial encounter Afebrile.  4 X 2 cm wound bed red moderate amounts of serous drainage noted.No odor.surrounding skin tissue without any signs of infection.  - For home use only DME Other see comment: Orders faxed by CMA Jasmine at [facility] for facility Nurse to cleanse Wound with saline,pat dry and cover with foam dressing for drainage absorption. - Advised to Notify provider's office for any fever,chills, redness,yellow drainage or odor.  2. Varicose veins of leg with edema, bilateral - Advised to wear compression stockings on in the morning and off at bedtime.   - Keep legs elevated when seated  - Compression stockings  Family/ staff Communication: Reviewed plan of care with patient verbalized understanding.  Labs/tests ordered: None   Next Appointment: 1 week at the facility with Dr.Reed   Sandrea Hughs, NP

## 2019-07-25 NOTE — Patient Instructions (Addendum)
-   cleanse left leg wound with saline,pat dry,cover wound bed with calcium alginate and foam dressing.change dressing daily.Notify provider's office for any redness,yellow drainage or odor. - Please wear compression stockings on in the morning and off at bedtime.   - Keep legs elevated when seated

## 2019-08-03 ENCOUNTER — Other Ambulatory Visit: Payer: Self-pay

## 2019-08-03 ENCOUNTER — Non-Acute Institutional Stay: Payer: Medicare PPO | Admitting: Internal Medicine

## 2019-08-03 ENCOUNTER — Encounter: Payer: Self-pay | Admitting: Internal Medicine

## 2019-08-03 VITALS — BP 138/82 | HR 74 | Temp 98.7°F | Ht 71.0 in | Wt 154.0 lb

## 2019-08-03 DIAGNOSIS — L03116 Cellulitis of left lower limb: Secondary | ICD-10-CM | POA: Diagnosis not present

## 2019-08-03 DIAGNOSIS — M24851 Other specific joint derangements of right hip, not elsewhere classified: Secondary | ICD-10-CM

## 2019-08-03 DIAGNOSIS — R195 Other fecal abnormalities: Secondary | ICD-10-CM | POA: Diagnosis not present

## 2019-08-03 DIAGNOSIS — Z66 Do not resuscitate: Secondary | ICD-10-CM | POA: Diagnosis not present

## 2019-08-03 MED ORDER — VITAMIN D (ERGOCALCIFEROL) 1.25 MG (50000 UNIT) PO CAPS: ORAL_CAPSULE | ORAL | 0 refills | Status: DC

## 2019-08-03 MED ORDER — DOXYCYCLINE HYCLATE 100 MG PO TABS: 100.0000 mg | ORAL_TABLET | Freq: Two times a day (BID) | ORAL | 0 refills | Status: DC

## 2019-08-03 NOTE — Progress Notes (Signed)
Location:  [facility] clinic Provider:  Daryn Pisani L. Mariea Clonts, D.O., C.M.D.  Code Status: DNR Goals of Care:  Advanced Directives 08/03/2019  Does Patient Have a Medical Advance Directive? Yes  Type of Paramedic of [place];Living will;Out of facility DNR (pink MOST or yellow form)  Does patient want to make changes to medical advance directive? No - Patient declined  Copy of [place] in Chart? Yes - validated most recent copy scanned in chart (See row information)  Pre-existing out of facility DNR order (yellow form or pink MOST form) Pink MOST/Yellow Form most recent copy in chart - Physician notified to receive inpatient order     Chief Complaint  Patient presents with  . Acute Visit    left leg wound     HPI: Patient is a 86 y.o. male with h/o mild AS, vertigo, vitamin D deficiency, retinal detachment, skin cancer, lower back pain seen today for acute visit for left leg wound--he had bumped it on a chair cushion getting out of the bus.  Left leg bled a lot.  There was concern about too much swelling in the leg.  He had an allergy to neomycin.  vaseline has helped it.  No pain.  It was itchy at one time.    Two events over the year--the vertigo: hit him twice 2 fridays in a row (end of October '20).  Took several weeks for him to feel halfway normal and resuem his exercise classes.  Bowels have been kind of loose since the time of vertigo.  No change in odor.  Occasionally dark (no pepto or iron use) but not consistently.  No abdominal pain.  No fecal incontinence.  Feels well overall.  Maybe a little increased flatus.  No intermittent constipation.  No visible blood.  No clear pattern to loose stool.  He also awoke one morning with a severe pain in his right leg.  Ever since, he hears a clunking sound in his hip that comes and goes when he walks.  If he bends the knee just a little, the clunking goes away.  It took him about two weeks to become limber  after the day he awoke with the pain--would be sore when he woke up and had to stretch a lot.  Did a one mile excursion 2.5 wks ago and no problems at all with the leg then.   No clunking.    Past Medical History:  Diagnosis Date  . Actinic keratosis 04/05/2012  . Insomnia, unspecified 06/08/2008  . Lumbago 01/02/2003  . 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 Pain in limb 02/05/2011  . Retinal detachment 2000   left eye  . Screening cholesterol level   . Shortness of breath 09/07/2008  . Vitamin D deficiency 08/04/2012    Past Surgical History:  Procedure Laterality Date  . EXTERNAL EAR SURGERY  05/2012   left ear   . INGUINAL HERNIA REPAIR Right 01/21/2005   Dr. [name]  . LID LESION EXCISION  2009  . MOHS SURGERY Left 05/28/2012   ear lobe Dr. [name]  . RETINAL DETACHMENT SURGERY Left [facility]    Allergies  Allergen Reactions  . Benzalkonium Chloride Rash    Very allergic per patient   . Maxitrol [Neomycin-Polymyxin-Dexameth] Itching and Rash  . Neosporin [Neomycin-Bacitracin Zn-Polymyx] Itching and Rash    Outpatient Encounter Medications as of 08/03/2019  Medication Sig  . Vitamin D, Ergocalciferol, (DRISDOL) 1.25 MG (50000 UNIT)  CAPS capsule TAKE ONE CAPSULE ONCE A WEEK FOR VITAMIN D SUPPLEMENT.  . [DISCONTINUED] Vitamin D, Ergocalciferol, (DRISDOL) 1.25 MG (50000 UNIT) CAPS capsule TAKE ONE CAPSULE ONCE A WEEK FOR VITAMIN D SUPPLEMENT.   No facility-administered encounter medications on file as of 08/03/2019.    Review of Systems:  Review of Systems  Constitutional: Negative for chills, fever and malaise/fatigue.  Eyes: Negative for blurred vision.  Respiratory: Negative for shortness of breath.   Cardiovascular: Positive for leg swelling. Negative for chest pain and palpitations.  Gastrointestinal: Positive for diarrhea. Negative for abdominal pain, blood in stool, constipation, heartburn, melena, nausea and vomiting.  Genitourinary: Negative for  dysuria.  Musculoskeletal: Negative for falls.       Right leg pain (not current) and clunking of hip  Skin: Negative for itching and rash.       Left lower leg wound not healing fully   Neurological: Negative for dizziness and loss of consciousness.  Endo/Heme/Allergies: Bruises/bleeds easily.  Psychiatric/Behavioral: Negative for depression and memory loss. The patient is not nervous/anxious and does not have insomnia.     Health Maintenance  Topic Date Due  . INFLUENZA VACCINE  10/02/2019  . TETANUS/TDAP  12/11/2027  . COVID-19 Vaccine  Completed  . PNA vac Low Risk Adult  Completed    Physical Exam: Vitals:   08/03/19 1502  BP: 138/82  Pulse: 74  Temp: 98.7 F (37.1 C)  SpO2: 97%  Weight: 154 lb (69.9 kg)  Height: 5\' 11"  (1.803 m)   Body mass index is 21.48 kg/m. Physical Exam Vitals reviewed.  Constitutional:      General: He is not in acute distress.    Appearance: Normal appearance. He is not ill-appearing or toxic-appearing.  Eyes:     Comments: Dark pigment left medial canthus  Cardiovascular:     Rate and Rhythm: Normal rate and regular rhythm.     Pulses: Normal pulses.     Heart sounds: Murmur present.  Pulmonary:     Effort: Pulmonary effort is normal.     Breath sounds: Normal breath sounds.  Abdominal:     General: Bowel sounds are normal. There is no distension.     Palpations: Abdomen is soft. There is no mass.     Tenderness: There is no abdominal tenderness. There is no guarding or rebound.  Musculoskeletal:        General: Normal range of motion.     Right lower leg: Edema present.     Left lower leg: Edema present.     Comments: 1+ edema bilaterally  Skin:    Comments: Left leg with red, warm oval surrounding prior skin tear (appears a layer of skin was removed during abrasion), no longer open or draining, but remains slightly swollen, nontender   Neurological:     General: No focal deficit present.     Mental Status: He is alert and  oriented to person, place, and time.  Psychiatric:        Mood and Affect: Mood normal.        Behavior: Behavior normal.        Thought Content: Thought content normal.        Judgment: Judgment normal.     Labs reviewed: Basic Metabolic Panel: Recent Labs    12/16/18 0000  NA 136*  K 4.3  BUN 15  CREATININE 0.8   Liver Function Tests: Recent Labs    12/16/18 0000  AST 16  ALT 10  ALKPHOS 57  No results for input(s): LIPASE, AMYLASE in the last 8760 hours. No results for input(s): AMMONIA in the last 8760 hours. CBC: Recent Labs    12/16/18 0000  WBC 3.9  HGB 13.1*  HCT 39*  PLT 254   Lipid Panel: Recent Labs    12/16/18 0000  CHOL 189  HDL 69  LDLCALC 111  TRIG 45   Lab Results  Component Value Date   HGBA1C 5.9 03/09/2012   Assessment/Plan 1. Cellulitis of left lower extremity - appears there is now infection at the site of his prior noninfected skin tear--this is now closed and seems to be nearly resolved except for new erythema and warmth - will treat him with abx for 10 days  - doxycycline (VIBRA-TABS) 100 MG tablet; Take 1 tablet (100 mg total) by mouth 2 (two) times daily. Eat yogurt daily  Dispense: 20 tablet; Refill: 0  2. Loose stools -new issue for about 6 mos now (just reported today) -he wanted to hold off on investigation, but this is a change in bowel habits for him though I don't see any other red flags  -check stool for occult blood, c diff and stool culture (clinic nurse was contacting lab to get proper stool collection bottles for this) -may need CT abdomen to eval for late onset colitis vs mass only allowing loose stool to pass--he did not want this right away again due to being otherwise in his normal state of health -wt oscillates in the mid to upper 150s looking back  3. Right hip crepitus -unclear what's happened here (?dislocated and then back in joint vs osteophytes from degenerative joint disease) -he wanted to hold off on  imaging b/c he has no pain any longer and is getting around just fine  Labs/tests ordered:  Stool for occult blood, stool culture and c diff  Next appt:  12/21/2019  Shaiden Aldous L. Moroni Nester, D.O. [facility] [address], [place] [address] Cell Phone (Mon-Fri 8am-5pm):  [phone] On Call:  [phone] & follow prompts after 5pm & weekends Office Phone:  [phone] Office Fax:  [phone]

## 2019-08-04 ENCOUNTER — Encounter: Payer: Self-pay | Admitting: Internal Medicine

## 2019-08-04 DIAGNOSIS — R195 Other fecal abnormalities: Secondary | ICD-10-CM | POA: Insufficient documentation

## 2019-08-04 DIAGNOSIS — M24851 Other specific joint derangements of right hip, not elsewhere classified: Secondary | ICD-10-CM | POA: Insufficient documentation

## 2019-08-10 DIAGNOSIS — A0472 Enterocolitis due to Clostridium difficile, not specified as recurrent: Secondary | ICD-10-CM | POA: Diagnosis not present

## 2019-08-10 DIAGNOSIS — R195 Other fecal abnormalities: Secondary | ICD-10-CM | POA: Diagnosis not present

## 2019-09-13 DIAGNOSIS — L570 Actinic keratosis: Secondary | ICD-10-CM | POA: Diagnosis not present

## 2019-09-13 DIAGNOSIS — L905 Scar conditions and fibrosis of skin: Secondary | ICD-10-CM | POA: Diagnosis not present

## 2019-09-13 DIAGNOSIS — Z85828 Personal history of other malignant neoplasm of skin: Secondary | ICD-10-CM | POA: Diagnosis not present

## 2019-10-11 DIAGNOSIS — H43813 Vitreous degeneration, bilateral: Secondary | ICD-10-CM | POA: Diagnosis not present

## 2019-10-11 DIAGNOSIS — H40012 Open angle with borderline findings, low risk, left eye: Secondary | ICD-10-CM | POA: Diagnosis not present

## 2019-10-11 DIAGNOSIS — H26493 Other secondary cataract, bilateral: Secondary | ICD-10-CM | POA: Diagnosis not present

## 2019-10-11 DIAGNOSIS — H401212 Low-tension glaucoma, right eye, moderate stage: Secondary | ICD-10-CM | POA: Diagnosis not present

## 2019-10-24 ENCOUNTER — Encounter: Payer: Self-pay | Admitting: Internal Medicine

## 2019-10-24 DIAGNOSIS — H26493 Other secondary cataract, bilateral: Secondary | ICD-10-CM | POA: Diagnosis not present

## 2019-10-24 DIAGNOSIS — H26491 Other secondary cataract, right eye: Secondary | ICD-10-CM | POA: Diagnosis not present

## 2019-10-24 DIAGNOSIS — H35362 Drusen (degenerative) of macula, left eye: Secondary | ICD-10-CM | POA: Diagnosis not present

## 2019-10-24 DIAGNOSIS — H401212 Low-tension glaucoma, right eye, moderate stage: Secondary | ICD-10-CM | POA: Diagnosis not present

## 2019-10-24 DIAGNOSIS — H40012 Open angle with borderline findings, low risk, left eye: Secondary | ICD-10-CM | POA: Diagnosis not present

## 2019-10-27 ENCOUNTER — Other Ambulatory Visit: Payer: Self-pay | Admitting: Internal Medicine

## 2019-12-21 ENCOUNTER — Encounter: Payer: Self-pay | Admitting: Internal Medicine

## 2019-12-21 ENCOUNTER — Other Ambulatory Visit: Payer: Self-pay

## 2019-12-21 ENCOUNTER — Ambulatory Visit (INDEPENDENT_AMBULATORY_CARE_PROVIDER_SITE_OTHER): Payer: Medicare PPO | Admitting: Internal Medicine

## 2019-12-21 VITALS — BP 130/78 | HR 75 | Temp 98.1°F | Ht 71.0 in | Wt 154.2 lb

## 2019-12-21 DIAGNOSIS — R195 Other fecal abnormalities: Secondary | ICD-10-CM | POA: Diagnosis not present

## 2019-12-21 DIAGNOSIS — B354 Tinea corporis: Secondary | ICD-10-CM | POA: Insufficient documentation

## 2019-12-21 DIAGNOSIS — D649 Anemia, unspecified: Secondary | ICD-10-CM | POA: Diagnosis not present

## 2019-12-21 DIAGNOSIS — M24851 Other specific joint derangements of right hip, not elsewhere classified: Secondary | ICD-10-CM | POA: Diagnosis not present

## 2019-12-21 DIAGNOSIS — E559 Vitamin D deficiency, unspecified: Secondary | ICD-10-CM | POA: Diagnosis not present

## 2019-12-21 DIAGNOSIS — I83893 Varicose veins of bilateral lower extremities with other complications: Secondary | ICD-10-CM | POA: Insufficient documentation

## 2019-12-21 DIAGNOSIS — Z0000 Encounter for general adult medical examination without abnormal findings: Secondary | ICD-10-CM | POA: Diagnosis not present

## 2019-12-21 DIAGNOSIS — K439 Ventral hernia without obstruction or gangrene: Secondary | ICD-10-CM | POA: Insufficient documentation

## 2019-12-21 DIAGNOSIS — I350 Nonrheumatic aortic (valve) stenosis: Secondary | ICD-10-CM | POA: Insufficient documentation

## 2019-12-21 MED ORDER — VITAMIN D (ERGOCALCIFEROL) 1.25 MG (50000 UNIT) PO CAPS: ORAL_CAPSULE | ORAL | 3 refills | Status: DC

## 2019-12-21 MED ORDER — CLOTRIMAZOLE 1 % EX CREA: 1.0000 "application " | TOPICAL_CREAM | Freq: Two times a day (BID) | CUTANEOUS | 0 refills | Status: DC

## 2019-12-21 NOTE — Progress Notes (Signed)
Provider:  Rexene Edison. Mariea Clonts, D.O., C.M.D. Location:   [facility]     Place of Service:   clinic  Previous PCP: Gayland Curry, DO Patient Care Team: Gayland Curry, DO as PCP - General (Geriatric Medicine) [name], MD as Consulting Physician (Ophthalmology)  Extended Emergency Contact Information Primary Emergency Contact: [place] Phone: [phone] Relation: Daughter  Code Status: DNR Goals of Care: Advanced Directive information Advanced Directives 12/21/2019  Does Patient Have a Medical Advance Directive? Yes  Type of Advance Directive Out of facility DNR (pink MOST or yellow form)  Does patient want to make changes to medical advance directive? No - Patient declined  Copy of [place] in Chart? -  Pre-existing out of facility DNR order (yellow form or pink MOST form) Pink MOST/Yellow Form most recent copy in chart - Physician notified to receive inpatient order   Chief Complaint  Patient presents with  . Annual Exam    CPE  . Health Maintenance    influenza ([facility])  . Acute Visit    Diarrhea   x months    HPI: Patient is a 86 y.o. male seen today for an annual physical exam--he's continued also to have diarrhea though and had not reported back to me about ongoing symptoms as suggested previously.  He's been taking imodium which helps some.    He once in a while, has an explosion of stuff, that's not in form.  It just explodes out.  He goes 1-2 times per day--he then takes the imodium for the past 2-3 wks now.  No blood, no black stool, not particularly foul/different.  No abdominal pain, no nausea or vomiting.  A couple of weeks ago, he was at a ine pairing dinner with 6 types of wine and lovely food b/w.  He didn't sleep as easily that night.  No bowel issues from that.  He drinks much less wine than usual and had quit coffee but that didn't make a difference.    I  suggested a GI referral, but he would "rather not" do this.  He's trying to eat a lot more fiber.  He likes these foods.  Discussed that these should be bulking and help him have firm stools but it's not working.  Right hip is no longer making noises.  Has improved.    Past Medical History:  Diagnosis Date  . Actinic keratosis 04/05/2012  . Insomnia, unspecified 06/08/2008  . Lumbago 01/02/2003  . 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 Pain in limb 02/05/2011  . Retinal detachment 2000   left eye  . Screening cholesterol level   . Shortness of breath 09/07/2008  . Vitamin D deficiency 08/04/2012   Past Surgical History:  Procedure Laterality Date  . EXTERNAL EAR SURGERY  05/2012   left ear   . INGUINAL HERNIA REPAIR Right 01/21/2005   Dr. [name]  . LID LESION EXCISION  2009  . MOHS SURGERY Left 05/28/2012   ear lobe Dr. [name]  . [facility]    reports that he has never smoked. He has never used smokeless tobacco. He reports current alcohol use of about 1.0 standard drink of alcohol per week. He reports that he does not use drugs.  Family History  Problem Relation Age of Onset  . Cancer Mother        breast   Health Maintenance  Topic  Date Due  . INFLUENZA VACCINE  10/02/2019  . TETANUS/TDAP  12/11/2027  . COVID-19 Vaccine  Completed  . PNA vac Low Risk Adult  Completed    Allergies  Allergen Reactions  . Benzalkonium Chloride Rash    Very allergic per patient   . Maxitrol [Neomycin-Polymyxin-Dexameth] Itching and Rash  . Neosporin [Neomycin-Bacitracin Zn-Polymyx] Itching and Rash    Outpatient Encounter Medications as of 12/21/2019  Medication Sig  . Vitamin D, Ergocalciferol, (DRISDOL) 1.25 MG (50000 UNIT) CAPS capsule TAKE ONE CAPSULE ONCE A WEEK FOR VITAMIN D SUPPLEMENT.  . [DISCONTINUED] doxycycline (VIBRA-TABS) 100 MG tablet Take 1 tablet (100 mg total) by mouth 2 (two) times daily. Eat yogurt daily   No  facility-administered encounter medications on file as of 12/21/2019.    Review of Systems  Constitutional: Negative for chills, fever, malaise/fatigue and weight loss.  HENT: Negative for congestion, hearing loss and sore throat.   Eyes: Negative for blurred vision.  Respiratory: Negative for cough and shortness of breath.   Cardiovascular: Positive for leg swelling. Negative for chest pain and palpitations.  Gastrointestinal: Positive for diarrhea. Negative for abdominal pain, blood in stool, constipation, heartburn, melena, nausea and vomiting.  Genitourinary: Negative for dysuria.  Musculoskeletal: Negative for falls and joint pain.  Skin: Negative for itching and rash.  Neurological: Negative for dizziness and loss of consciousness.  Psychiatric/Behavioral: Negative for depression and memory loss. The patient is not nervous/anxious and does not have insomnia.     Vitals:   12/21/19 1434  Weight: 154 lb 3.2 oz (69.9 kg)  Height: 5\' 11"  (1.803 m)   Body mass index is 21.51 kg/m. Physical Exam Vitals reviewed.  Constitutional:      General: He is not in acute distress.    Appearance: He is not ill-appearing or toxic-appearing.  HENT:     Head: Normocephalic and atraumatic.     Right Ear: External ear normal.     Left Ear: External ear normal.     Nose: Nose normal.     Mouth/Throat:     Pharynx: Oropharynx is clear.  Eyes:     Extraocular Movements: Extraocular movements intact.     Conjunctiva/sclera: Conjunctivae normal.     Pupils: Pupils are equal, round, and reactive to light.     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     Pulses: Normal pulses.     Heart sounds: Murmur heard.   Pulmonary:     Effort: Pulmonary effort is normal.     Breath sounds: Normal breath sounds. No wheezing, rhonchi or rales.  Abdominal:     General: Bowel sounds are  normal. There is no distension.     Palpations: Abdomen is soft. There is no mass.     Tenderness: There is no abdominal tenderness. There is no guarding or rebound.     Hernia: A hernia is present.     Comments: Appears he has either a ventral hernia or diasthesis recti, seemed to have quite a bit of soft stool palpable in his abdomen through his abdomen and prominent protruding area when he sat up  Musculoskeletal:        General: Normal range of motion.     Cervical back: Neck supple.     Right lower leg: Edema present.     Left lower leg: Edema present.     Comments:  Increasing kyphosis  Skin:    Comments: Two raised erythematous areas on left knee with scaly borders (he had not noticed them)  Neurological:     General: No focal deficit present.     Mental Status: He is alert and oriented to person, place, and time.     Cranial Nerves: No cranial nerve deficit.     Sensory: No sensory deficit.     Motor: No weakness.     Coordination: Coordination normal.     Gait: Gait normal.     Deep Tendon Reflexes: Reflexes normal.     Comments: Is lightheaded some on standing and steadies himself  Psychiatric:        Mood and Affect: Mood normal.     Labs reviewed: Basic Metabolic Panel: No results for input(s): NA, K, CL, CO2, GLUCOSE, BUN, CREATININE, CALCIUM, MG, PHOS in the last 8760 hours. Liver Function Tests: No results for input(s): AST, ALT, ALKPHOS, BILITOT, PROT, ALBUMIN in the last 8760 hours. No results for input(s): LIPASE, AMYLASE in the last 8760 hours. No results for input(s): AMMONIA in the last 8760 hours. CBC: No results for input(s): WBC, NEUTROABS, HGB, HCT, MCV, PLT in the last 8760 hours. Cardiac Enzymes: No results for input(s): CKTOTAL, CKMB, CKMBINDEX, TROPONINI in the last 8760 hours. BNP: Invalid input(s): POCBNP Lab Results  Component Value Date   HGBA1C 5.9 03/09/2012   No results found for: TSH No results found for: VITAMINB12 No results found  for: FOLATE No results found for: IRON, TIBC, FERRITIN  Assessment/Plan 1. Annual physical exam -performed today, will get flu shot with other [place] residents  2. Loose stools -I question if this is due to some partial obstruction/volvulus/twist of his bowel or hernia and he's having loose stool going around his hard stool based on exam -agrees to CT Abdomen Pelvis Wo Contrast; Future  but did not want to see GI  3. Ventral hernia without obstruction or gangrene -no signs of ischemia or strangulation - CT Abdomen Pelvis Wo Contrast; Future  4. Varicose veins of leg with edema, bilateral -remain, has some edema likely venous insufficiency, might benefit from use of compression hose, but he's not bothered by any of this  5. Vitamin D deficiency -cont D supplementation  6. Right hip crepitus -due to OA, improved since last time   7. Mild aortic stenosis by prior echocardiogram -asymptomatic murmur  8. Anemia, unspecified type -monitor closely, hemoccult negative previously, other stool studies/results appear never have been scanned as requested presumably b/c I "wrote on them"   9. Tinea corporis -on left knee area--lamisil cream prescribed to use until resolved; if does not work, may be some psoriasis actually and need a cortisone cream  Labs/tests ordered:  Cbc with diff, cmp in am at [facility], CT abd/pelvis w/o IV contrast but with oral contrast  Brayden Betters L. Antonia Culbertson, D.O. [facility] [address], [place] [address] Cell Phone (Mon-Fri 8am-5pm):  [phone] On Call:  [phone] & follow prompts after 5pm & weekends Office Phone:  [phone] Office Fax:  [phone]

## 2019-12-22 DIAGNOSIS — R195 Other fecal abnormalities: Secondary | ICD-10-CM | POA: Diagnosis not present

## 2019-12-22 LAB — HEPATIC FUNCTION PANEL
ALT: 10 (ref 10–40)
AST: 18 (ref 14–40)

## 2019-12-22 LAB — BASIC METABOLIC PANEL
BUN: 16 (ref 4–21)
CO2: 29 — AB (ref 13–22)
Chloride: 101 (ref 99–108)
Creatinine: 0.8 (ref 0.6–1.3)
Glucose: 118
Potassium: 4.5 (ref 3.4–5.3)
Sodium: 137 (ref 137–147)

## 2019-12-22 LAB — CBC AND DIFFERENTIAL
HCT: 33 — AB (ref 41–53)
Hemoglobin: 10.7 — AB (ref 13.5–17.5)
Platelets: 326 (ref 150–399)
WBC: 4.6

## 2019-12-22 LAB — CBC: RBC: 3.68 — AB (ref 3.87–5.11)

## 2019-12-22 LAB — COMPREHENSIVE METABOLIC PANEL
Albumin: 3.8 (ref 3.5–5.0)
Calcium: 9.1 (ref 8.7–10.7)
Globulin: 2.3

## 2019-12-28 ENCOUNTER — Telehealth: Payer: Self-pay

## 2019-12-28 NOTE — Telephone Encounter (Signed)
Called and left message on his VM for him to call back. I did state that we were in clinic today at [facility]. Per Dr. Mariea Clonts need an iron panel with ferritin. Hemoglobin has dropped 3 points over the year anemia is work. If he calls back to the office please let him know that he can come and get that drawn on a Tue or [place] @ 8am.

## 2019-12-29 ENCOUNTER — Encounter: Payer: Self-pay | Admitting: Internal Medicine

## 2019-12-30 ENCOUNTER — Encounter: Payer: Self-pay | Admitting: Internal Medicine

## 2020-01-04 ENCOUNTER — Other Ambulatory Visit: Payer: Self-pay

## 2020-01-04 ENCOUNTER — Encounter: Payer: Self-pay | Admitting: Internal Medicine

## 2020-01-04 ENCOUNTER — Non-Acute Institutional Stay: Payer: Medicare PPO | Admitting: Internal Medicine

## 2020-01-04 VITALS — BP 128/78 | HR 74 | Temp 97.9°F | Ht 71.0 in | Wt 155.6 lb

## 2020-01-04 DIAGNOSIS — K439 Ventral hernia without obstruction or gangrene: Secondary | ICD-10-CM | POA: Diagnosis not present

## 2020-01-04 DIAGNOSIS — D649 Anemia, unspecified: Secondary | ICD-10-CM | POA: Diagnosis not present

## 2020-01-04 DIAGNOSIS — R195 Other fecal abnormalities: Secondary | ICD-10-CM | POA: Diagnosis not present

## 2020-01-04 DIAGNOSIS — B354 Tinea corporis: Secondary | ICD-10-CM

## 2020-01-04 NOTE — Patient Instructions (Signed)
Increase your iron intake in leafy greens and some red meat. Let's get the CT scan done of your abdomen/pelvis to evaluate why you've had loose stools and anemia.   I'd also like for you to get an iron panel, B12, and folate labs to evaluate your anemia more completely.   I will send you your CT results in mychart and Alec Weiss will call you when your labs return.

## 2020-01-04 NOTE — Progress Notes (Signed)
Location:  Occupational psychologist of Service:  Clinic (12)  Provider: Vestal Crandall L. Mariea Clonts, D.O., C.M.D.  Code Status: DNR Goals of Care:  Advanced Directives 01/04/2020  Does Patient Have a Medical Advance Directive? Yes  Type of Advance Directive Out of facility DNR (pink MOST or yellow form)  Does patient want to make changes to medical advance directive? No - Patient declined  Copy of [place] in Chart? -  Pre-existing out of facility DNR order (yellow form or pink MOST form) -     Chief Complaint  Patient presents with  . Medical Management of Chronic Issues    3 week follow up to go over labs     HPI: Patient is a 86 y.o. male seen today for medical management of chronic diseases.     Past Medical History:  Diagnosis Date  . Actinic keratosis 04/05/2012  . Insomnia, unspecified 06/08/2008  . Lumbago 01/02/2003  . 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 Pain in limb 02/05/2011  . Retinal detachment 2000   left eye  . Screening cholesterol level   . Shortness of breath 09/07/2008  . Vitamin D deficiency 08/04/2012    Past Surgical History:  Procedure Laterality Date  . EXTERNAL EAR SURGERY  05/2012   left ear   . INGUINAL HERNIA REPAIR Right 01/21/2005   Dr. [name]  . LID LESION EXCISION  2009  . MOHS SURGERY Left 05/28/2012   ear lobe Dr. [name]  . RETINAL DETACHMENT SURGERY Left [facility]    Allergies  Allergen Reactions  . Benzalkonium Chloride Rash    Very allergic per patient   . Maxitrol [Neomycin-Polymyxin-Dexameth] Itching and Rash  . Neosporin [Neomycin-Bacitracin Zn-Polymyx] Itching and Rash    Outpatient Encounter Medications as of 01/04/2020  Medication Sig  . clotrimazole (LOTRIMIN) 1 % cream Apply 1 application topically 2 (two) times daily. To left knee for two dry scaly areas until resolved  . Vitamin D, Ergocalciferol, (DRISDOL) 1.25 MG (50000 UNIT) CAPS capsule TAKE ONE CAPSULE ONCE A  WEEK FOR VITAMIN D SUPPLEMENT.   No facility-administered encounter medications on file as of 01/04/2020.    Review of Systems:  Review of Systems  Constitutional: Negative for chills, fever, malaise/fatigue and weight loss.  HENT: Negative for congestion.   Eyes: Negative for blurred vision.       Uses reading glasses  Respiratory: Negative for cough and shortness of breath.   Cardiovascular: Negative for chest pain.  Gastrointestinal: Positive for diarrhea. Negative for abdominal pain, blood in stool, constipation, heartburn, melena, nausea and vomiting.  Genitourinary: Negative for dysuria.  Musculoskeletal: Negative for falls and joint pain.  Skin:       Two reddish purple raised scaly areas remain on left knee  Neurological: Negative for dizziness and loss of consciousness.  Endo/Heme/Allergies: Bruises/bleeds easily.  Psychiatric/Behavioral: Negative for depression and memory loss. The patient is not nervous/anxious and does not have insomnia.     Health Maintenance  Topic Date Due  . INFLUENZA VACCINE  10/02/2019  . TETANUS/TDAP  12/11/2027  . COVID-19 Vaccine  Completed  . PNA vac Low Risk Adult  Completed    Physical Exam: Vitals:   01/04/20 1332  BP: 128/78  Pulse: 74  Temp: 97.9 F (36.6 C)  TempSrc: Temporal  SpO2: 97%  Weight: 155 lb 9.6 oz (70.6 kg)  Height: 5\' 11"  (1.803 m)   Body mass index is 21.7 kg/m. Physical  Exam Vitals reviewed.  Constitutional:      Appearance: Normal appearance.  Cardiovascular:     Rate and Rhythm: Normal rate and regular rhythm.     Heart sounds: Murmur heard.   Pulmonary:     Effort: Pulmonary effort is normal.     Breath sounds: Normal breath sounds. No wheezing, rhonchi or rales.  Abdominal:     General: Bowel sounds are normal. There is no distension.     Palpations: Abdomen is soft.     Tenderness: There is no abdominal tenderness. There is no guarding or rebound.     Hernia: A hernia is present.    Musculoskeletal:        General: Normal range of motion.  Skin:    General: Skin is warm and dry.     Comments: Two erythematous scaly patches left knee  Neurological:     General: No focal deficit present.     Mental Status: He is alert and oriented to person, place, and time.  Psychiatric:        Mood and Affect: Mood normal.        Behavior: Behavior normal.     Labs reviewed: Basic Metabolic Panel: Recent Labs    12/22/19 0000  NA 137  K 4.5  CL 101  CO2 29*  BUN 16  CREATININE 0.8  CALCIUM 9.1   Liver Function Tests: Recent Labs    12/22/19 0000  AST 18  ALT 10  ALBUMIN 3.8   No results for input(s): LIPASE, AMYLASE in the last 8760 hours. No results for input(s): AMMONIA in the last 8760 hours. CBC: Recent Labs    12/22/19 0000  WBC 4.6  HGB 10.7*  HCT 33*  PLT 326   Lipid Panel: No results for input(s): CHOL, HDL, LDLCALC, TRIG, CHOLHDL, LDLDIRECT in the last 8760 hours. Lab Results  Component Value Date   HGBA1C 5.9 03/09/2012    Procedures since last visit: No results found.  Assessment/Plan 1. Loose stools -unchanged with stool bulking methods like metamucil, psyllium -CT scheduled for next week to r/o colitis vs mass vs volvulus contributing to symptoms  2. Ventral hernia without obstruction or gangrene -ongoing, await CT for assistance in evaluation  3. Anemia, unspecified type -2 pt drop in hgb over past year concerning -encouraged iron in diet -f/u iron panel with ferritin, b12 and folate in am  4. Tinea corporis -areas on left knee unchanged -he did not pick up the cream after the last appt (was out of town for about a week) so will get now and start  Labs/tests ordered: iron panel with ferritin and b12/folate, CT pending  Next appt:  02/08/2020--keep unless we discuss otherwise  Rhena Glace L. Joclyn Alsobrook, D.O. [facility] [address], [place] [address] Cell Phone (Mon-Fri  8am-5pm):  [phone] On Call:  [phone] & follow prompts after 5pm & weekends Office Phone:  [phone] Office Fax:  [phone]

## 2020-01-05 DIAGNOSIS — D519 Vitamin B12 deficiency anemia, unspecified: Secondary | ICD-10-CM | POA: Diagnosis not present

## 2020-01-05 DIAGNOSIS — D649 Anemia, unspecified: Secondary | ICD-10-CM | POA: Diagnosis not present

## 2020-01-05 LAB — IRON,TIBC AND FERRITIN PANEL
Ferritin: 9.31
Iron: 40

## 2020-01-05 LAB — VITAMIN B12
Vitamin B-12: 280
Vitamin B-12: 280

## 2020-01-10 ENCOUNTER — Ambulatory Visit
Admission: RE | Admit: 2020-01-10 | Discharge: 2020-01-10 | Disposition: A | Discharge status: Home / Self Care | Payer: Medicare Other | Source: Ambulatory Visit | Attending: Internal Medicine | Admitting: Internal Medicine

## 2020-01-10 DIAGNOSIS — K5730 Diverticulosis of large intestine without perforation or abscess without bleeding: Secondary | ICD-10-CM | POA: Diagnosis not present

## 2020-01-10 DIAGNOSIS — R109 Unspecified abdominal pain: Secondary | ICD-10-CM | POA: Diagnosis not present

## 2020-01-10 DIAGNOSIS — K449 Diaphragmatic hernia without obstruction or gangrene: Secondary | ICD-10-CM | POA: Diagnosis not present

## 2020-01-10 DIAGNOSIS — N1330 Unspecified hydronephrosis: Secondary | ICD-10-CM | POA: Diagnosis not present

## 2020-01-10 DIAGNOSIS — R195 Other fecal abnormalities: Secondary | ICD-10-CM

## 2020-01-10 DIAGNOSIS — K439 Ventral hernia without obstruction or gangrene: Secondary | ICD-10-CM

## 2020-01-10 IMAGING — CT CT ABD-PELV W/O CM
2 of 4 series · 13 of 46 positions shown, 15 images · non-contrast
Comparison: None.

CLINICAL DATA: Diffuse abdominal pain. Diarrhea. Previous inguinal
hernia repair.

EXAM:
CT ABDOMEN AND PELVIS WITHOUT CONTRAST
TECHNIQUE: Multidetector CT imaging of the abdomen and pelvis was performed
following the standard protocol without IV contrast.

[Series 2: routine abdomen pelvis without 5.00 br40 s3 axial · axial · non-contrast · 0.60mm/px · z∈[+1159,+1529]mm · 10 of 90 slices shown, 12 images]
[im 8/90  soft-tissue]
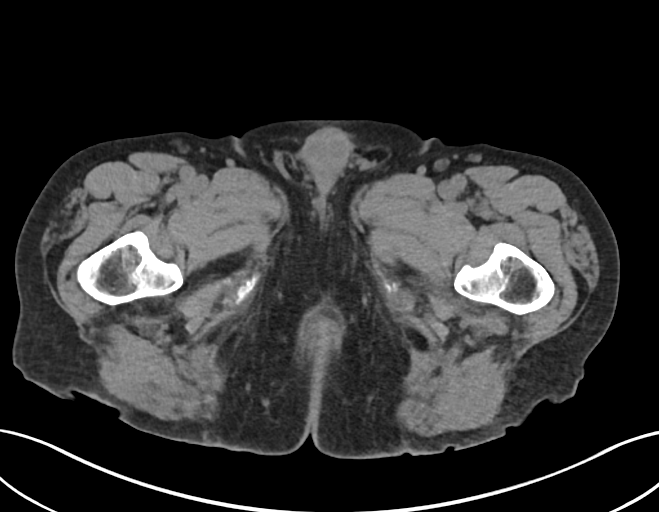
[im 8/90  bone]
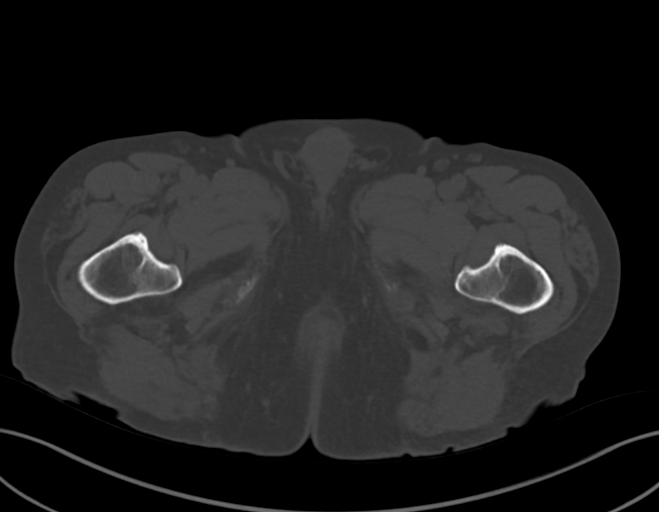
[im 15/90  soft-tissue]
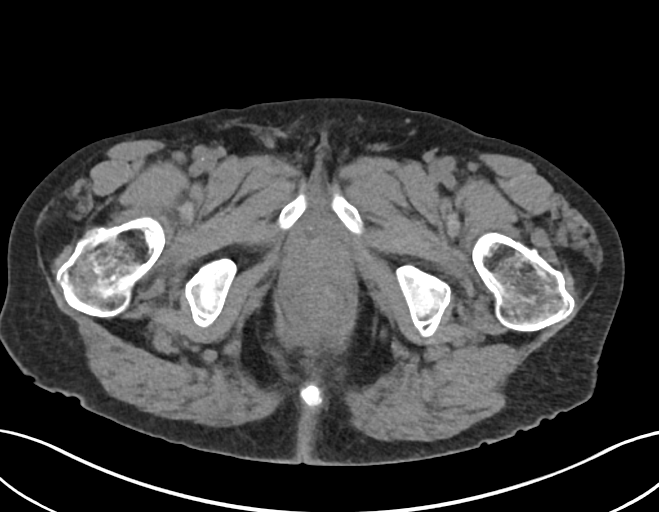
[im 25/90  soft-tissue]
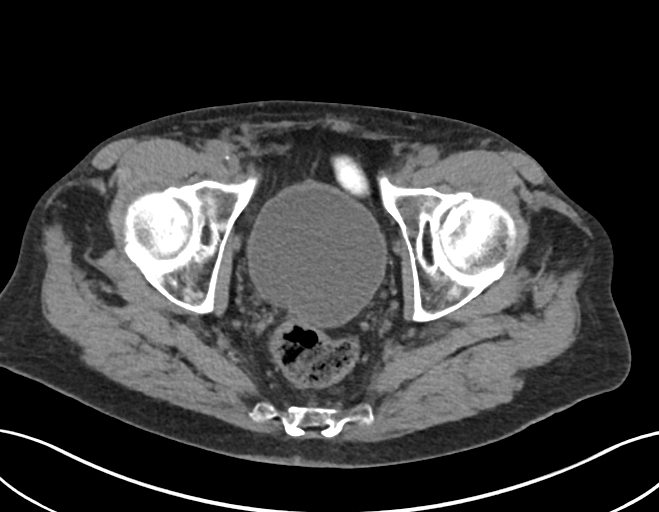
[im 33/90  soft-tissue]
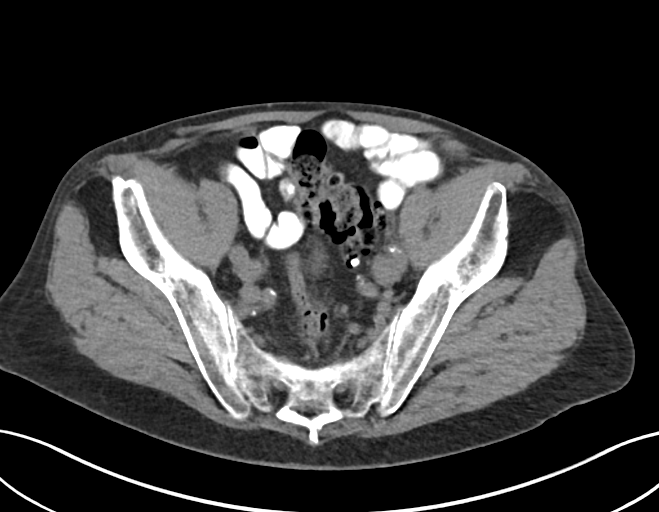
[im 40/90  soft-tissue]
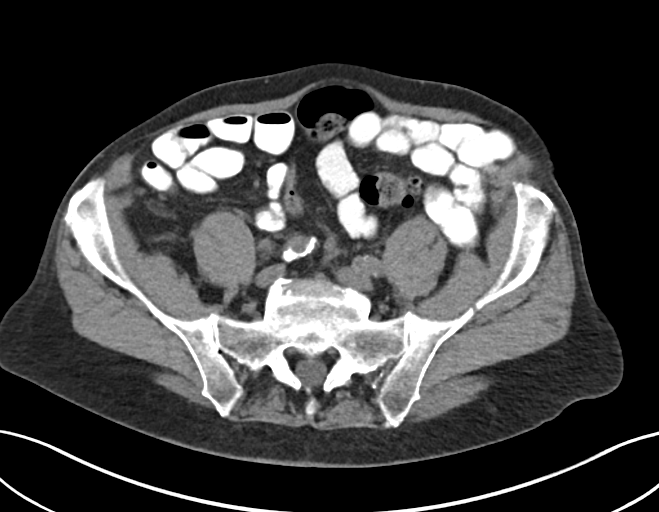
[im 50/90  soft-tissue]
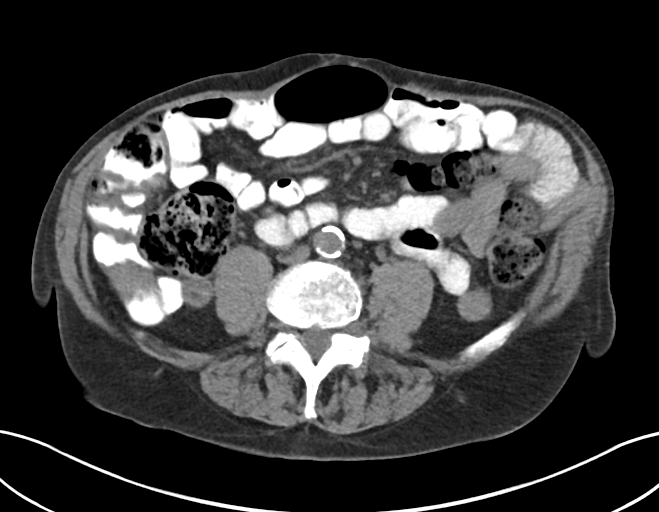
[im 57/90  soft-tissue]
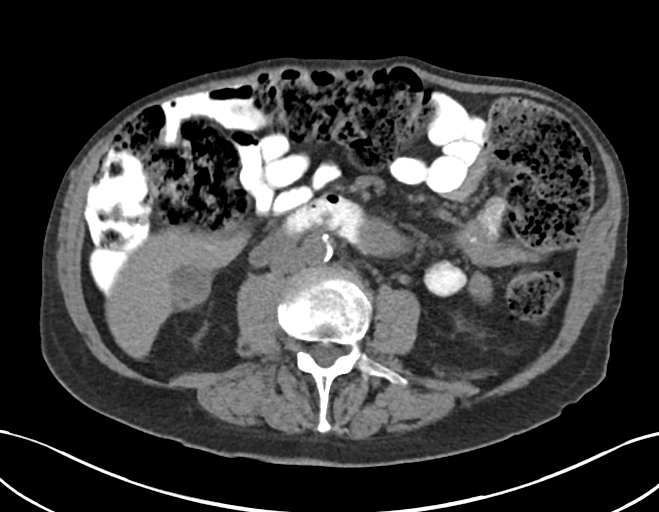
[im 68/90  soft-tissue]
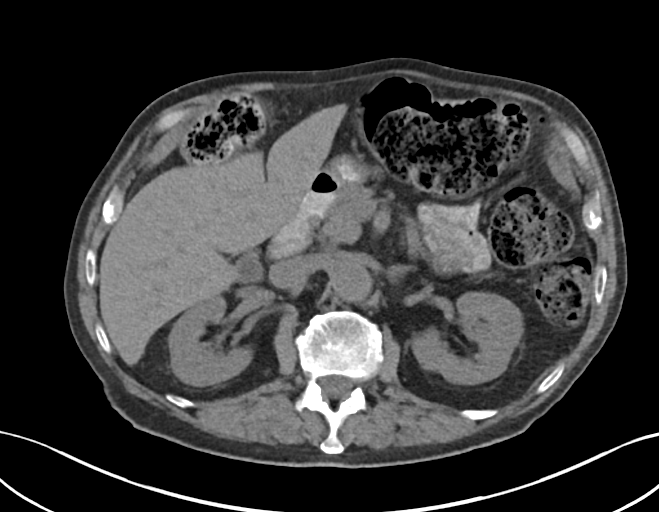
[im 75/90  soft-tissue]
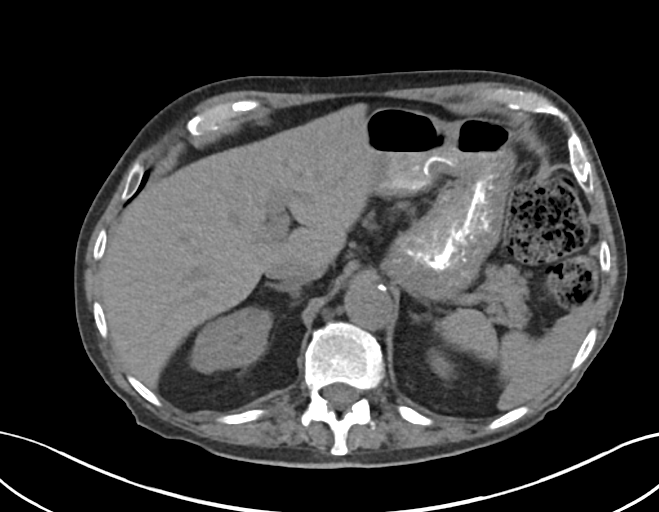
[im 75/90  bone]
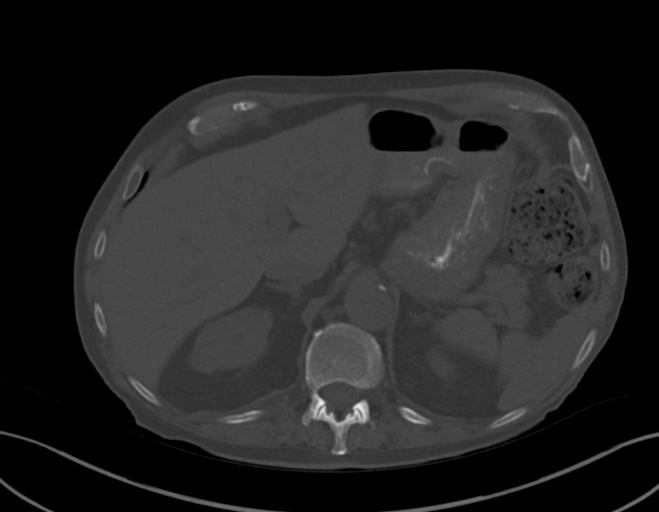
[im 82/90  soft-tissue]
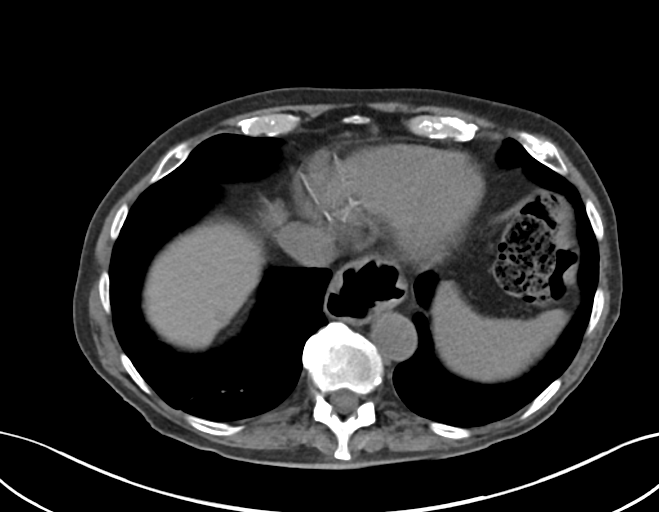

[Series 4: routine abdomen pelvis without 2.00 br40 s3 cor · coronal · non-contrast · 0.77mm/px · 3 of 151 slices shown]
[im 51/151  soft-tissue]
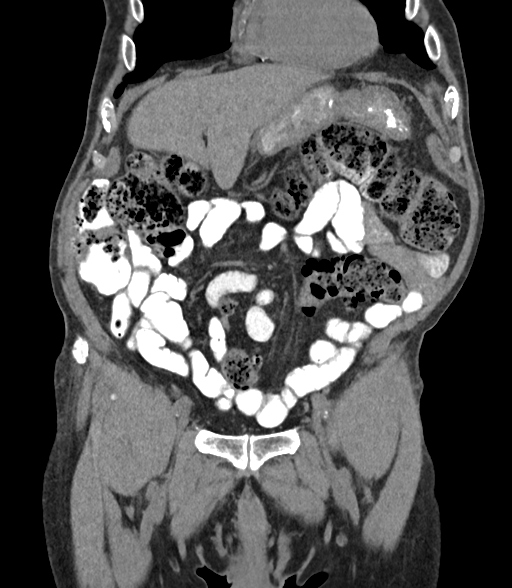
[im 67/151  soft-tissue]
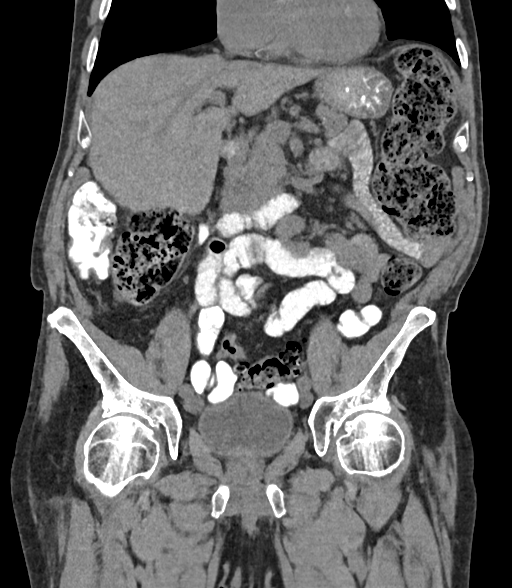
[im 84/151  soft-tissue]
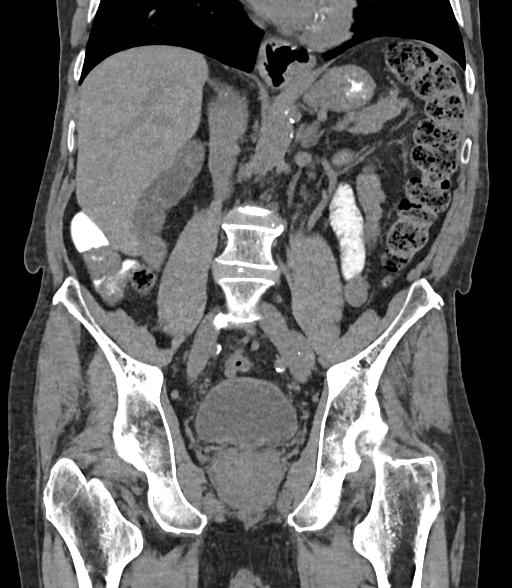

[13 of 46 positions shown; findings below may reference images not displayed]

FINDINGS: Lower chest: No acute findings.

Hepatobiliary: No mass visualized on this unenhanced exam.
Gallbladder is unremarkable. No evidence of biliary ductal
dilatation.

Pancreas: No mass or inflammatory process visualized on this
unenhanced exam.

Spleen:  Within normal limits in size.

Adrenals/Urinary tract: No evidence of urolithiasis or
hydronephrosis. Tiny low-attenuation lesion upper pole of right
kidney likely represents a tiny cyst. Unremarkable unopacified
urinary bladder.

Stomach/Bowel: Small to moderate hiatal hernia is seen. No evidence
of obstruction, inflammatory process, or abnormal fluid collections.
Diverticulosis is seen involving the distal descending and proximal
sigmoid colon, however there is no evidence of diverticulitis. Large
stool burden is noted throughout the colon.

Vascular/Lymphatic: No pathologically enlarged lymph nodes
identified. No evidence of abdominal aortic aneurysm. Aortic
atherosclerotic calcification noted.

Reproductive:  Mildly enlarged prostate.

Other: None. No evidence of recurrent inguinal or other ventral
hernia

Musculoskeletal:  No suspicious bone lesions identified.
IMPRESSION: No acute findings.

Large stool burden noted; recommend clinical correlation for
possible constipation.

Colonic diverticulosis. No radiographic evidence of diverticulitis.

Small to moderate hiatal hernia.

Mildly enlarged prostate.

Aortic Atherosclerosis ([4L]-[4L]).

## 2020-01-11 ENCOUNTER — Encounter: Payer: Self-pay | Admitting: Internal Medicine

## 2020-01-11 NOTE — Progress Notes (Signed)
He has a small to moderate hiatal hernia (stomach has popped up over the diaphragm instead of staying under it)--this can cause difficulty with indigestion and reflux.   He does have diverticulosis--little pockets in the bowels but no active inflammation of these (diverticulitis). He had a large amount of stool in his bowel.   He has aortic atherosclerosis--plaque build-up in the big blood vessel that goes from the heart to the rest of the body He has mild BPH (enlarged prostate) So, it seems he may be having loose stools around some constipation, but, the good news is that there were no areas of thickening of the bowel concerning for colitis or cancer. We can review this on 12/8 at his appt and figure out solutions.

## 2020-01-16 ENCOUNTER — Telehealth: Payer: Self-pay

## 2020-01-16 ENCOUNTER — Other Ambulatory Visit: Payer: Self-pay

## 2020-01-16 MED ORDER — VITAMIN B-12 1000 MCG PO TABS: 1000.0000 ug | ORAL_TABLET | Freq: Every day | ORAL | Status: DC

## 2020-01-16 MED ORDER — POLYSACCHARIDE IRON COMPLEX 150 MG PO CAPS
150.0000 mg | ORAL_CAPSULE | Freq: Every day | ORAL | 1 refills | Status: DC
Start: 2020-01-16 — End: 2020-05-28

## 2020-01-16 MED ORDER — POLYSACCHARIDE IRON COMPLEX 150 MG PO CAPS: 150.0000 mg | ORAL_CAPSULE | Freq: Every day | ORAL | Status: DC

## 2020-01-16 MED ORDER — VITAMIN B-12 1000 MCG PO TABS
1000.0000 ug | ORAL_TABLET | Freq: Every day | ORAL | 1 refills | Status: DC
Start: 2020-01-16 — End: 2020-09-06

## 2020-01-16 NOTE — Progress Notes (Signed)
This encounter was created in error - please disregard.

## 2020-01-16 NOTE — Telephone Encounter (Signed)
Patient called back and he was given Dr. Cyndi Lennert recommendations about starting B12 1000 mcg daily and Nu-iron 150 mg daily. He requested they be sent to [facility].

## 2020-01-16 NOTE — Telephone Encounter (Signed)
Called patient to give him lab results and Dr. Cyndi Lennert recommendations about starting B12 1020mcg daily and Nu-Iron 150 mg tablet daily for anemia. Will try calling patient again later today. Message routed to Dr. Mariea Clonts for her information.

## 2020-02-08 ENCOUNTER — Other Ambulatory Visit: Payer: Self-pay

## 2020-02-08 ENCOUNTER — Encounter: Payer: Self-pay | Admitting: Internal Medicine

## 2020-02-08 ENCOUNTER — Non-Acute Institutional Stay: Payer: Medicare PPO | Admitting: Internal Medicine

## 2020-02-08 VITALS — BP 138/72 | HR 67 | Temp 97.7°F | Ht 71.0 in | Wt 157.0 lb

## 2020-02-08 DIAGNOSIS — K5901 Slow transit constipation: Secondary | ICD-10-CM | POA: Diagnosis not present

## 2020-02-08 DIAGNOSIS — R195 Other fecal abnormalities: Secondary | ICD-10-CM

## 2020-02-08 DIAGNOSIS — D649 Anemia, unspecified: Secondary | ICD-10-CM | POA: Diagnosis not present

## 2020-02-08 DIAGNOSIS — K5790 Diverticulosis of intestine, part unspecified, without perforation or abscess without bleeding: Secondary | ICD-10-CM | POA: Diagnosis not present

## 2020-02-08 DIAGNOSIS — I700 Atherosclerosis of aorta: Secondary | ICD-10-CM

## 2020-02-08 DIAGNOSIS — B354 Tinea corporis: Secondary | ICD-10-CM | POA: Diagnosis not present

## 2020-02-08 MED ORDER — CLOTRIMAZOLE 1 % EX CREA: 1.0000 "application " | TOPICAL_CREAM | Freq: Two times a day (BID) | CUTANEOUS | 0 refills | Status: DC

## 2020-02-08 NOTE — Progress Notes (Signed)
Location:  [facility] of Service:  Clinic (12)  Provider: Neeka Urista L. Mariea Clonts, D.O., C.M.D.  Code Status: DNR Goals of Care:  Advanced Directives 02/08/2020  Does Patient Have a Medical Advance Directive? Yes  Type of Advance Directive Out of facility DNR (pink MOST or yellow form)  Does patient want to make changes to medical advance directive? No - Patient declined  Copy of [place] in Chart? -  Pre-existing out of facility DNR order (yellow form or pink MOST form) Pink MOST/Yellow Form most recent copy in chart - Physician notified to receive inpatient order     Chief Complaint  Patient presents with  . Medical Management of Chronic Issues    6 mont follow up    HPI: Patient is a 86 y.o. male seen today for medical management of chronic diseases.    He's had discontinuous loose bms.  What he eats continues not to make a difference.  Incontinence is a danger for him.    He's had no problems with the capsules of iron.  No difference detected.  Past Medical History:  Diagnosis Date  . Actinic keratosis 04/05/2012  . Insomnia, unspecified 06/08/2008  . Lumbago 01/02/2003  . 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 Pain in limb 02/05/2011  . Retinal detachment 2000   left eye  . Screening cholesterol level   . Shortness of breath 09/07/2008  . Vitamin D deficiency 08/04/2012    Past Surgical History:  Procedure Laterality Date  . EXTERNAL EAR SURGERY  05/2012   left ear   . INGUINAL HERNIA REPAIR Right 01/21/2005   Dr. [name]  . LID LESION EXCISION  2009  . MOHS SURGERY Left 05/28/2012   ear lobe Dr. [name]  . RETINAL DETACHMENT SURGERY Left [facility]    Allergies  Allergen Reactions  . Benzalkonium Chloride Rash    Very allergic per patient   . Maxitrol [Neomycin-Polymyxin-Dexameth] Itching and Rash  . Neosporin [Neomycin-Bacitracin Zn-Polymyx] Itching and Rash    Outpatient Encounter Medications  as of 02/08/2020  Medication Sig  . clotrimazole (LOTRIMIN) 1 % cream Apply 1 application topically 2 (two) times daily. To left knee for two dry scaly areas until resolved  . iron polysaccharides (NU-IRON) 150 MG capsule Take 1 capsule (150 mg total) by mouth daily.  . vitamin B-12 (CYANOCOBALAMIN) 1000 MCG tablet Take 1 tablet (1,000 mcg total) by mouth daily.  . Vitamin D, Ergocalciferol, (DRISDOL) 1.25 MG (50000 UNIT) CAPS capsule TAKE ONE CAPSULE ONCE A WEEK FOR VITAMIN D SUPPLEMENT.   No facility-administered encounter medications on file as of 02/08/2020.    Review of Systems:  Review of Systems  Constitutional: Negative for chills, fever, malaise/fatigue and weight loss.  HENT: Negative for congestion, hearing loss and sore throat.   Eyes: Negative for blurred vision.  Respiratory: Negative for cough and shortness of breath.   Cardiovascular: Negative for chest pain, palpitations and leg swelling.  Gastrointestinal: Positive for diarrhea. Negative for abdominal pain, blood in stool, constipation and melena.  Genitourinary: Negative for dysuria.  Musculoskeletal: Negative for back pain, falls and joint pain.  Skin: Positive for rash. Negative for itching.  Neurological: Negative for dizziness and loss of consciousness.  Endo/Heme/Allergies: Bruises/bleeds easily.  Psychiatric/Behavioral: Negative for depression and memory loss. The patient is not nervous/anxious and does not have insomnia.     Health Maintenance  Topic Date Due  . TETANUS/TDAP  12/11/2027  .  INFLUENZA VACCINE  Completed  . COVID-19 Vaccine  Completed  . PNA vac Low Risk Adult  Completed    Physical Exam: Vitals:   02/08/20 1338  Temp: 97.7 F (36.5 C)  TempSrc: Temporal   There is no height or weight on file to calculate BMI. Physical Exam Vitals reviewed.  Constitutional:      General: He is not in acute distress.    Appearance: Normal appearance. He is not toxic-appearing.  HENT:     Head:  Normocephalic and atraumatic.  Cardiovascular:     Rate and Rhythm: Normal rate and regular rhythm.     Heart sounds: Murmur heard.   Pulmonary:     Effort: Pulmonary effort is normal.     Breath sounds: Normal breath sounds. No wheezing, rhonchi or rales.  Abdominal:     General: Bowel sounds are normal. There is no distension.  Musculoskeletal:        General: Normal range of motion.     Comments: Mild edema at sock lines  Skin:    Comments: Left knee still with raised round scaly areas  Neurological:     General: No focal deficit present.     Mental Status: He is alert and oriented to person, place, and time.     Gait: Gait normal.  Psychiatric:        Mood and Affect: Mood normal.     Labs reviewed: Basic Metabolic Panel: Recent Labs    12/22/19 0000  NA 137  K 4.5  CL 101  CO2 29*  BUN 16  CREATININE 0.8  CALCIUM 9.1   Liver Function Tests: Recent Labs    12/22/19 0000  AST 18  ALT 10  ALBUMIN 3.8   No results for input(s): LIPASE, AMYLASE in the last 8760 hours. No results for input(s): AMMONIA in the last 8760 hours. CBC: Recent Labs    12/22/19 0000  WBC 4.6  HGB 10.7*  HCT 33*  PLT 326   Lipid Panel: No results for input(s): CHOL, HDL, LDLCALC, TRIG, CHOLHDL, LDLDIRECT in the last 8760 hours. Lab Results  Component Value Date   HGBA1C 5.9 03/09/2012    Procedures since last visit: CT Abdomen Pelvis Wo Contrast  Result Date: 01/10/2020 CLINICAL DATA:  Diffuse abdominal pain. Diarrhea. Previous inguinal hernia repair. EXAM: CT ABDOMEN AND PELVIS WITHOUT CONTRAST TECHNIQUE: Multidetector CT imaging of the abdomen and pelvis was performed following the standard protocol without IV contrast. COMPARISON:  None. FINDINGS: Lower chest: No acute findings. Hepatobiliary: No mass visualized on this unenhanced exam. Gallbladder is unremarkable. No evidence of biliary ductal dilatation. Pancreas: No mass or inflammatory process visualized on this  unenhanced exam. Spleen:  Within normal limits in size. Adrenals/Urinary tract: No evidence of urolithiasis or hydronephrosis. Tiny low-attenuation lesion upper pole of right kidney likely represents a tiny cyst. Unremarkable unopacified urinary bladder. Stomach/Bowel: Small to moderate hiatal hernia is seen. No evidence of obstruction, inflammatory process, or abnormal fluid collections. Diverticulosis is seen involving the distal descending and proximal sigmoid colon, however there is no evidence of diverticulitis. Large stool burden is noted throughout the colon. Vascular/Lymphatic: No pathologically enlarged lymph nodes identified. No evidence of abdominal aortic aneurysm. Aortic atherosclerotic calcification noted. Reproductive:  Mildly enlarged prostate. Other: None. No evidence of recurrent inguinal or other ventral hernia Musculoskeletal:  No suspicious bone lesions identified. IMPRESSION: No acute findings. Large stool burden noted; recommend clinical correlation for possible constipation. Colonic diverticulosis. No radiographic evidence of diverticulitis. Small to moderate  hiatal hernia. Mildly enlarged prostate. Aortic Atherosclerosis (ICD10-I70.0). Electronically Signed   By: Marlaine Hind M.D.   On: 01/10/2020 16:21    Assessment/Plan 1. Loose stools -suggested metamucil to bulk stools with plenty of fluids to prevent incontinence  2. Tinea corporis - has still not tried to use the lotrimin - clotrimazole (LOTRIMIN) 1 % cream; Apply 1 application topically 2 (two) times daily. To left knee for two dry scaly areas until resolved  Dispense: 40 g; Refill: 0  3. Diverticulosis -noted on imaging  4. Slow transit constipation -as above, try metamucil  5. Anemia, unspecified type -treat with iron and b12 due to deficiencies on labs -recheck before next visit -discussed concerns about blood loss in bowels and he did not want more invasive workup beyond the CT he had to r/o colon ca  6.  Aortic atherosclerosis -noted on imaging incidentally -advanced age so avoid adding cholesterol med at this point  Labs/tests ordered:  Cbc, iron panel, b12 before Next appt:  3 mos with labs before  Electa Sterry L. Cantrell Martus, D.O. [facility] [address], [place] [address] Cell Phone (Mon-Fri 8am-5pm):  [phone] On Call:  [phone] & follow prompts after 5pm & weekends Office Phone:  [phone] Office Fax:  [phone]

## 2020-02-10 ENCOUNTER — Encounter: Payer: Medicare PPO | Admitting: Nurse Practitioner

## 2020-02-10 ENCOUNTER — Encounter: Payer: Medicare Other | Admitting: Nurse Practitioner

## 2020-03-05 ENCOUNTER — Telehealth: Payer: Self-pay | Admitting: Nurse Practitioner

## 2020-03-05 NOTE — Telephone Encounter (Signed)
Patient is not interested in having an AWV done. He is content with his visits with Dr. Renato Gails.

## 2020-04-17 DIAGNOSIS — B351 Tinea unguium: Secondary | ICD-10-CM | POA: Diagnosis not present

## 2020-04-17 DIAGNOSIS — L84 Corns and callosities: Secondary | ICD-10-CM | POA: Diagnosis not present

## 2020-04-17 DIAGNOSIS — Q6689 Other  specified congenital deformities of feet: Secondary | ICD-10-CM | POA: Diagnosis not present

## 2020-04-23 ENCOUNTER — Encounter: Payer: Self-pay | Admitting: Internal Medicine

## 2020-04-26 DIAGNOSIS — H40012 Open angle with borderline findings, low risk, left eye: Secondary | ICD-10-CM | POA: Diagnosis not present

## 2020-04-26 DIAGNOSIS — H401212 Low-tension glaucoma, right eye, moderate stage: Secondary | ICD-10-CM | POA: Diagnosis not present

## 2020-05-07 ENCOUNTER — Emergency Department (HOSPITAL_COMMUNITY)
Admission: EM | Admit: 2020-05-07 | Discharge: 2020-05-07 | Disposition: A | Discharge status: Home / Self Care | Payer: Medicare PPO | Attending: Emergency Medicine | Admitting: Emergency Medicine

## 2020-05-07 ENCOUNTER — Encounter (HOSPITAL_COMMUNITY): Payer: Self-pay

## 2020-05-07 ENCOUNTER — Emergency Department (HOSPITAL_COMMUNITY): Payer: Medicare PPO

## 2020-05-07 DIAGNOSIS — Z23 Encounter for immunization: Secondary | ICD-10-CM | POA: Insufficient documentation

## 2020-05-07 DIAGNOSIS — S0990XA Unspecified injury of head, initial encounter: Secondary | ICD-10-CM | POA: Diagnosis not present

## 2020-05-07 DIAGNOSIS — R58 Hemorrhage, not elsewhere classified: Secondary | ICD-10-CM | POA: Diagnosis not present

## 2020-05-07 DIAGNOSIS — Z85828 Personal history of other malignant neoplasm of skin: Secondary | ICD-10-CM | POA: Insufficient documentation

## 2020-05-07 DIAGNOSIS — W19XXXA Unspecified fall, initial encounter: Secondary | ICD-10-CM | POA: Diagnosis not present

## 2020-05-07 DIAGNOSIS — S80212A Abrasion, left knee, initial encounter: Secondary | ICD-10-CM | POA: Diagnosis not present

## 2020-05-07 DIAGNOSIS — S80211A Abrasion, right knee, initial encounter: Secondary | ICD-10-CM | POA: Insufficient documentation

## 2020-05-07 DIAGNOSIS — W01198A Fall on same level from slipping, tripping and stumbling with subsequent striking against other object, initial encounter: Secondary | ICD-10-CM | POA: Insufficient documentation

## 2020-05-07 DIAGNOSIS — Y92511 Restaurant or cafe as the place of occurrence of the external cause: Secondary | ICD-10-CM | POA: Diagnosis not present

## 2020-05-07 DIAGNOSIS — Y9301 Activity, walking, marching and hiking: Secondary | ICD-10-CM | POA: Diagnosis not present

## 2020-05-07 DIAGNOSIS — R220 Localized swelling, mass and lump, head: Secondary | ICD-10-CM | POA: Diagnosis not present

## 2020-05-07 DIAGNOSIS — S0181XA Laceration without foreign body of other part of head, initial encounter: Secondary | ICD-10-CM | POA: Diagnosis not present

## 2020-05-07 DIAGNOSIS — Z043 Encounter for examination and observation following other accident: Secondary | ICD-10-CM | POA: Diagnosis not present

## 2020-05-07 IMAGING — CT CT HEAD W/O CM
4 series · 16 of 47 positions shown, 18 images · non-contrast
Comparison: None.

CLINICAL DATA: Status post trauma.

EXAM:
CT HEAD WITHOUT CONTRAST
TECHNIQUE: Contiguous axial images were obtained from the base of the skull
through the vertex without intravenous contrast.

[Series 3: head wo · axial · 0.47mm/px · z∈[+1423,+1543]mm · 7 of 34 slices shown, 9 images]
[im 5/34  brain]
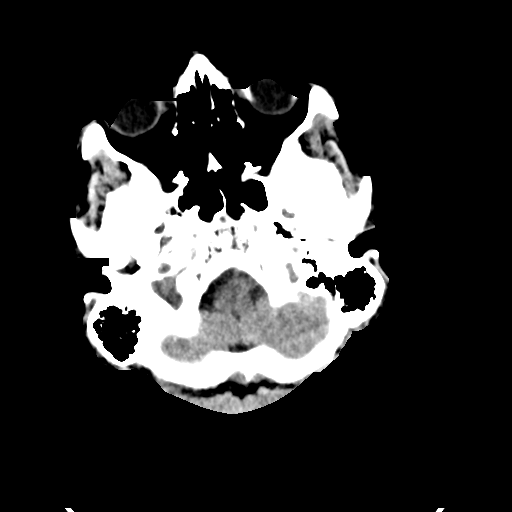
[im 5/34  bone]
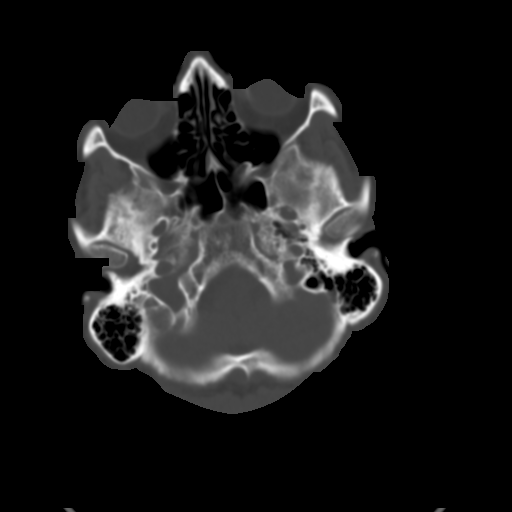
[im 9/34  brain]
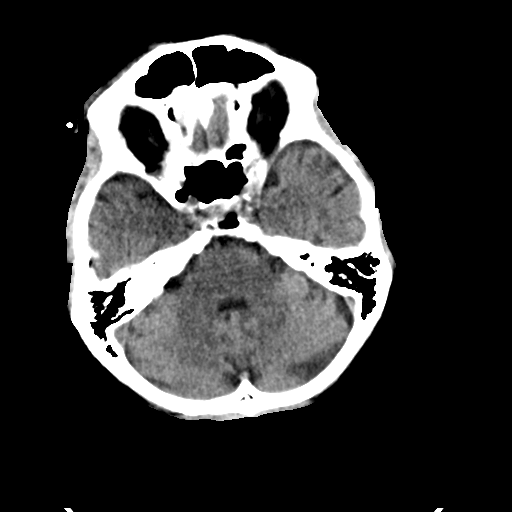
[im 13/34  brain]
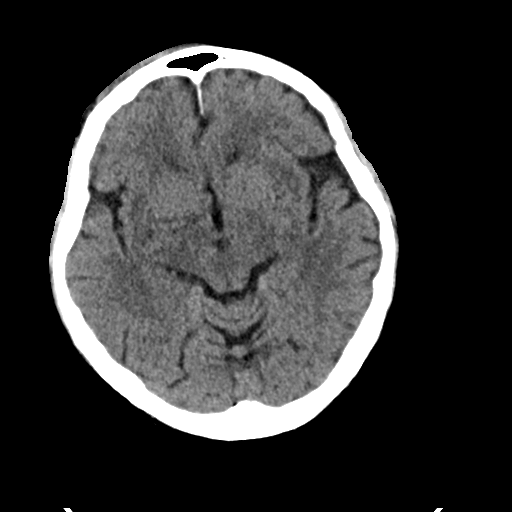
[im 17/34  brain]
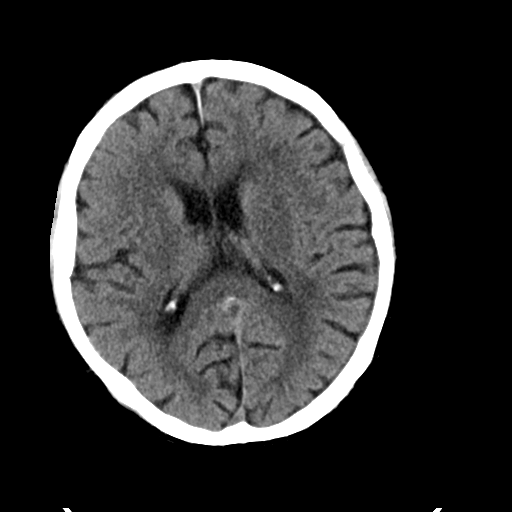
[im 21/34  brain]
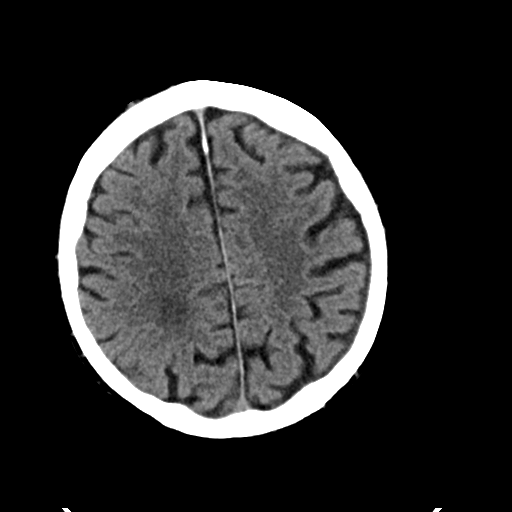
[im 21/34  bone]
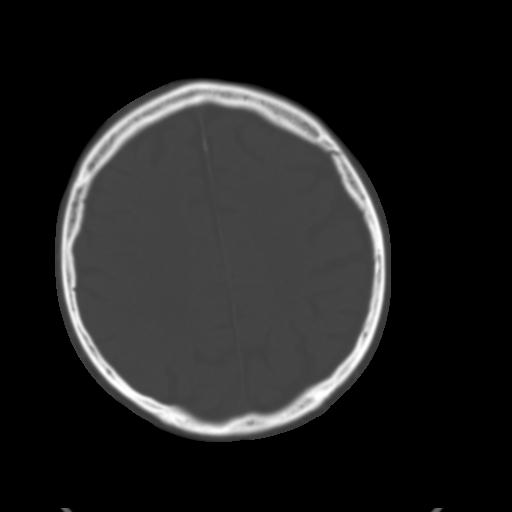
[im 25/34  brain]
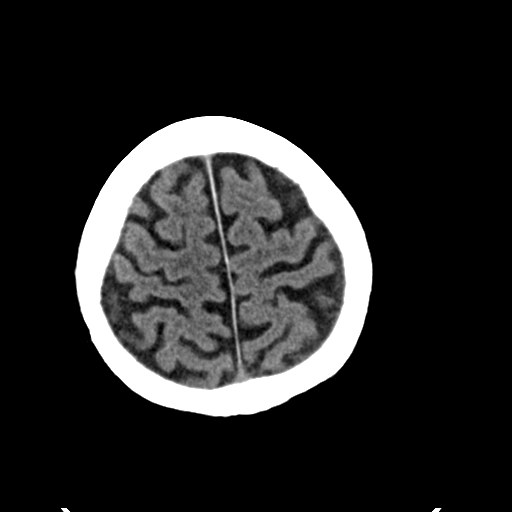
[im 29/34  brain]
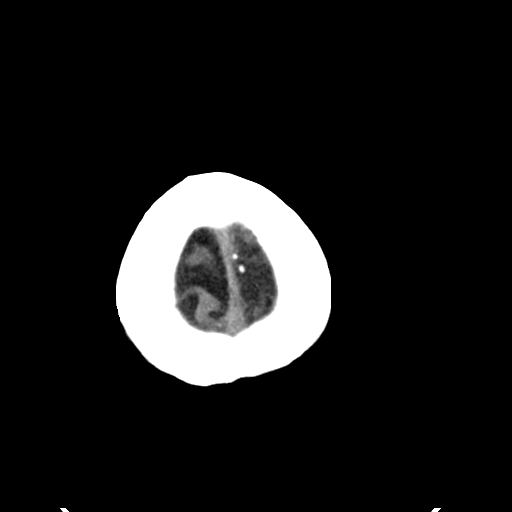

[Series 4: head bone · axial · 0.47mm/px · z∈[+1419,+1451]mm · 3 of 83 slices shown]
[im 9/83  bone]
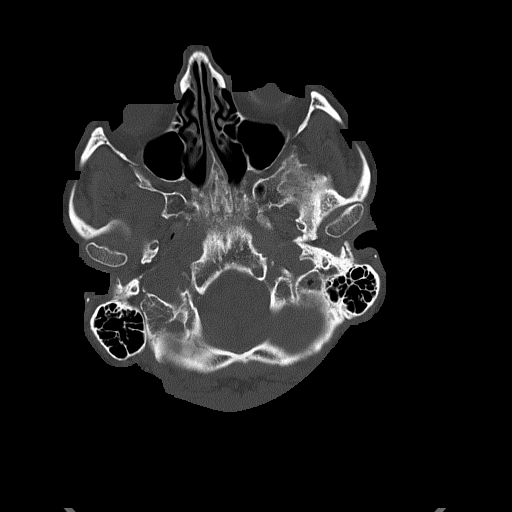
[im 17/83  bone]
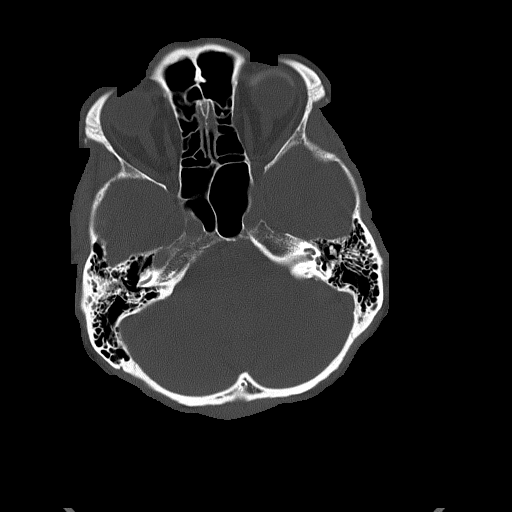
[im 25/83  bone]
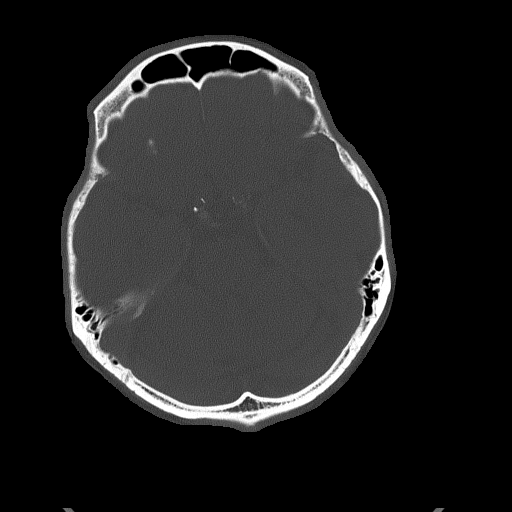

[Series 5: coronal soft tissue · coronal · 0.32mm/px · 3 of 67 slices shown]
[im 23/67  brain]
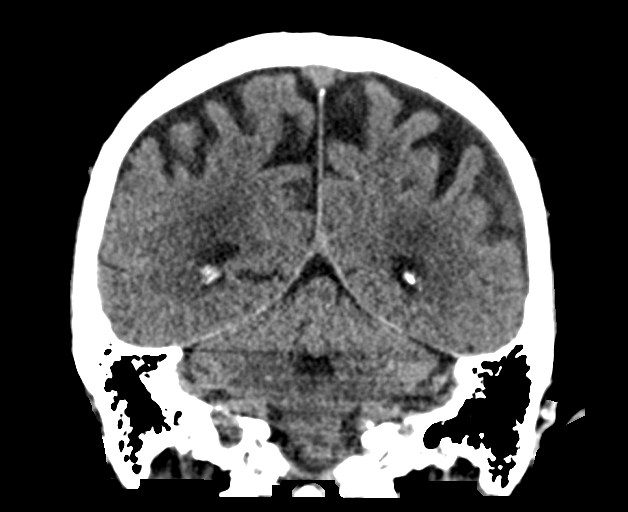
[im 30/67  brain]
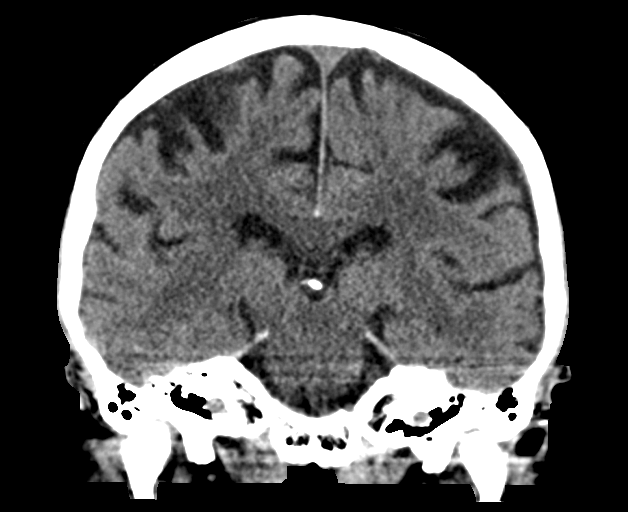
[im 37/67  brain]
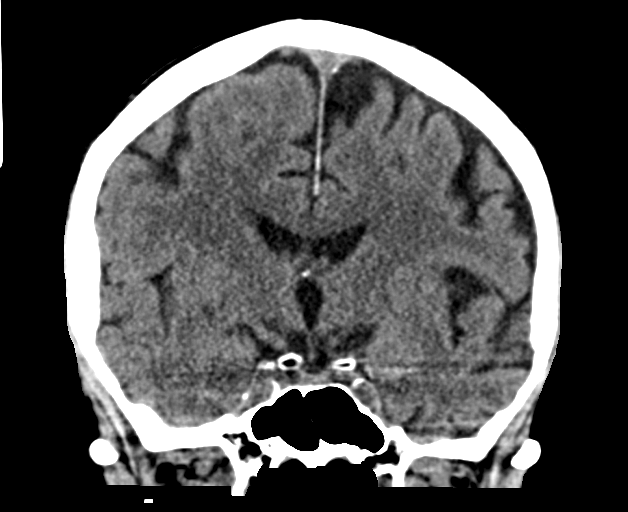

[Series 6: sagittal soft tissue · sagittal · 0.34mm/px · 3 of 57 slices shown]
[im 19/57  brain]
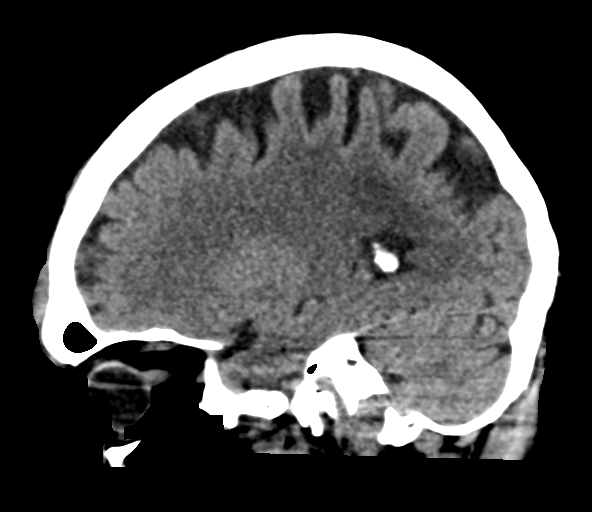
[im 29/57  brain]
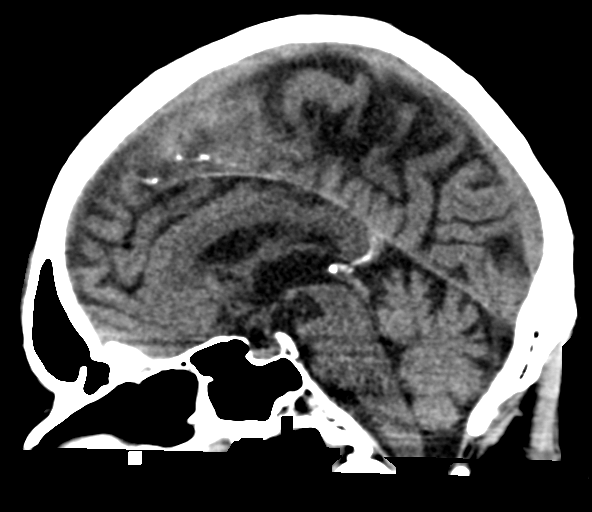
[im 38/57  brain]
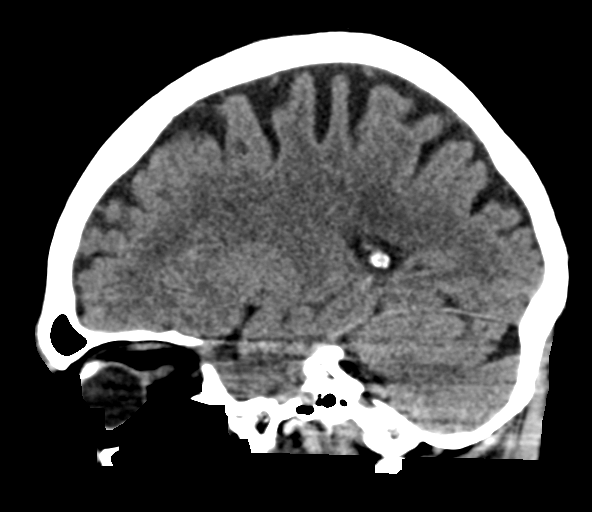

[16 of 47 positions shown; findings below may reference images not displayed]

FINDINGS: Brain: There is mild cerebral atrophy with widening of the
extra-axial spaces and ventricular dilatation.
There are areas of decreased attenuation within the white matter
tracts of the supratentorial brain, consistent with microvascular
disease changes.

Vascular: No hyperdense vessel or unexpected calcification.

Skull: Normal. Negative for fracture or focal lesion.

Sinuses/Orbits: No acute finding. Prior scleral banding is noted on
the left.

Other: Mild to moderate severity right frontal scalp soft tissue
swelling is seen.
IMPRESSION: Mild to moderate severity right frontal scalp soft tissue swelling
without evidence of an acute fracture or acute intracranial
abnormality.

## 2020-05-07 IMAGING — CT CT CERVICAL SPINE W/O CM
3 of 4 series · 9 of 33 positions shown, 11 images · non-contrast
Comparison: None.

CLINICAL DATA: Status post fall.

EXAM:
CT CERVICAL SPINE WITHOUT CONTRAST
TECHNIQUE: Multidetector CT imaging of the cervical spine was performed without
intravenous contrast. Multiplanar CT image reconstructions were also
generated.

[Series 6: sagittal bone · sagittal · 0.24mm/px · 5 of 61 slices shown, 6 images]
[im 21/61  bone]
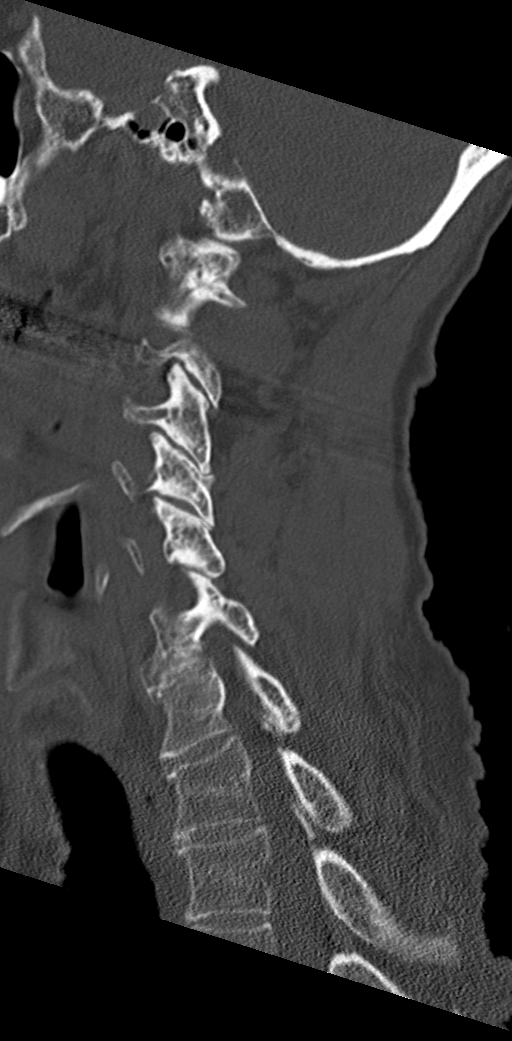
[im 26/61  bone]
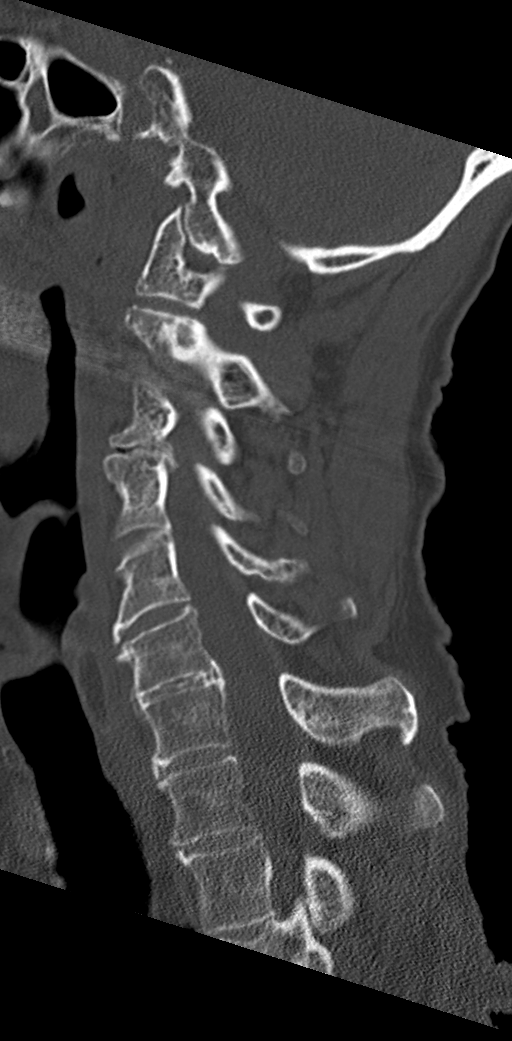
[im 31/61  soft-tissue]
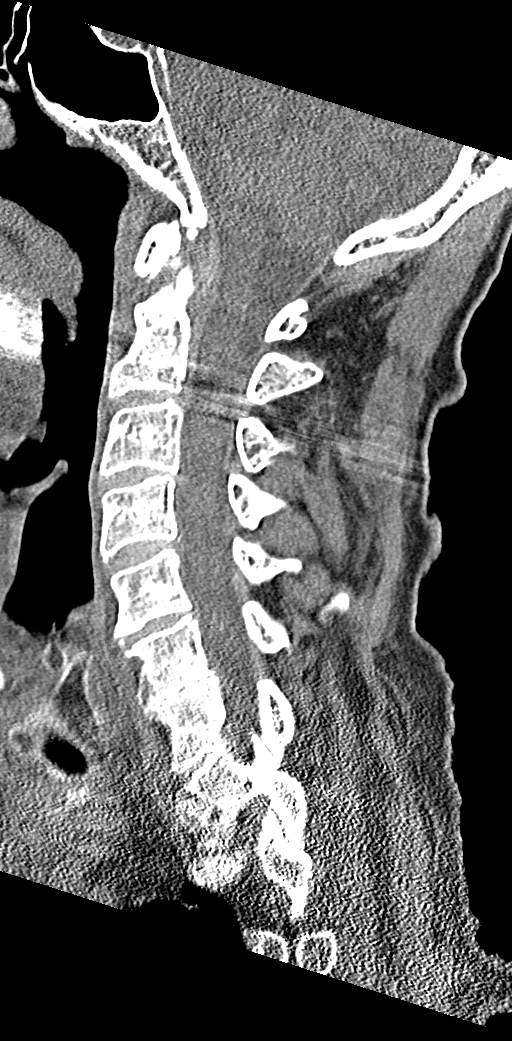
[im 31/61  bone]
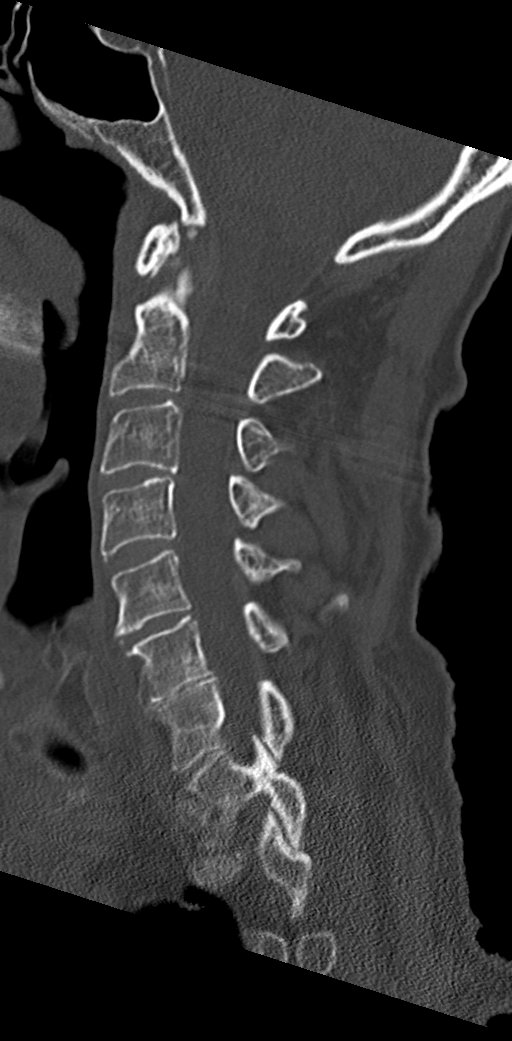
[im 36/61  bone]
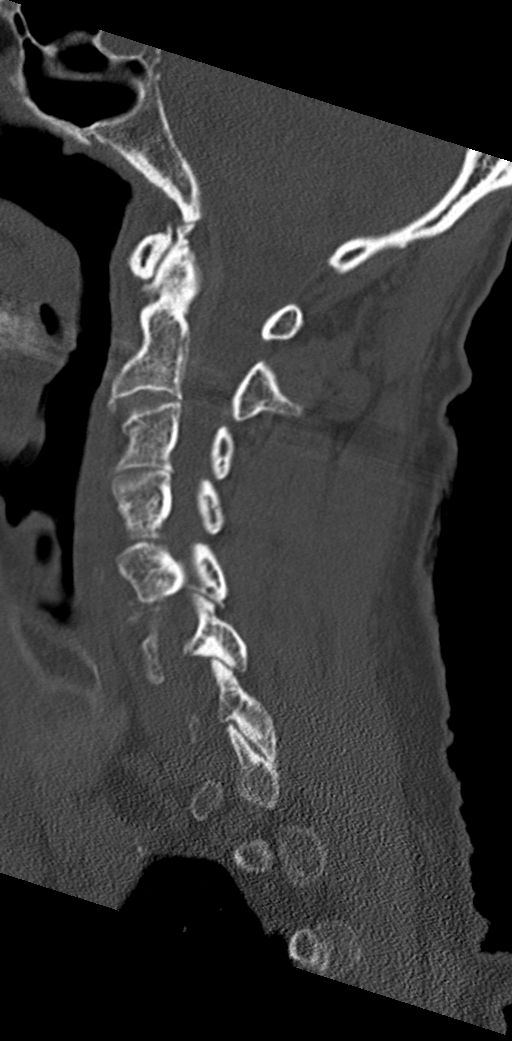
[im 41/61  bone]
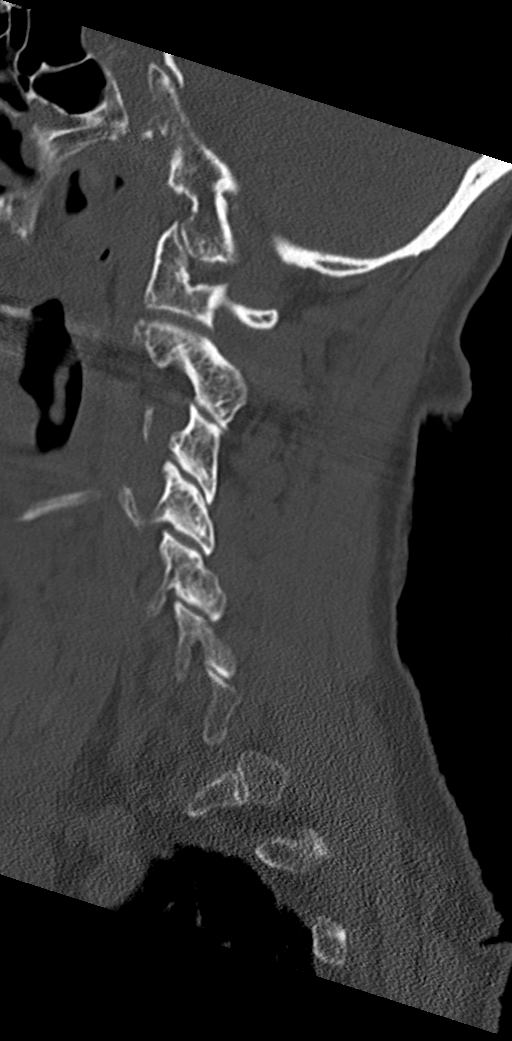

[Series 7: coronal bone · coronal · 0.27mm/px · 3 of 69 slices shown]
[im 14/69  bone]
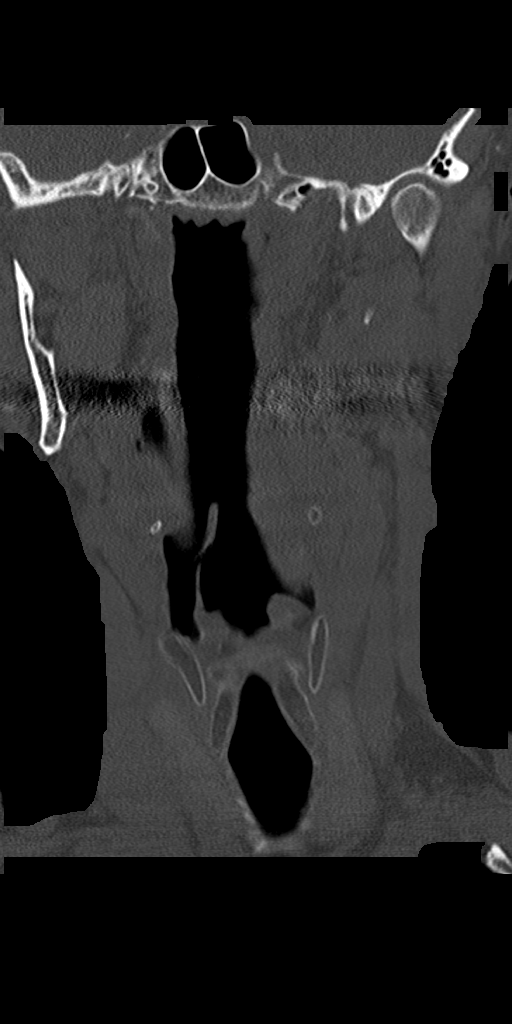
[im 28/69  bone]
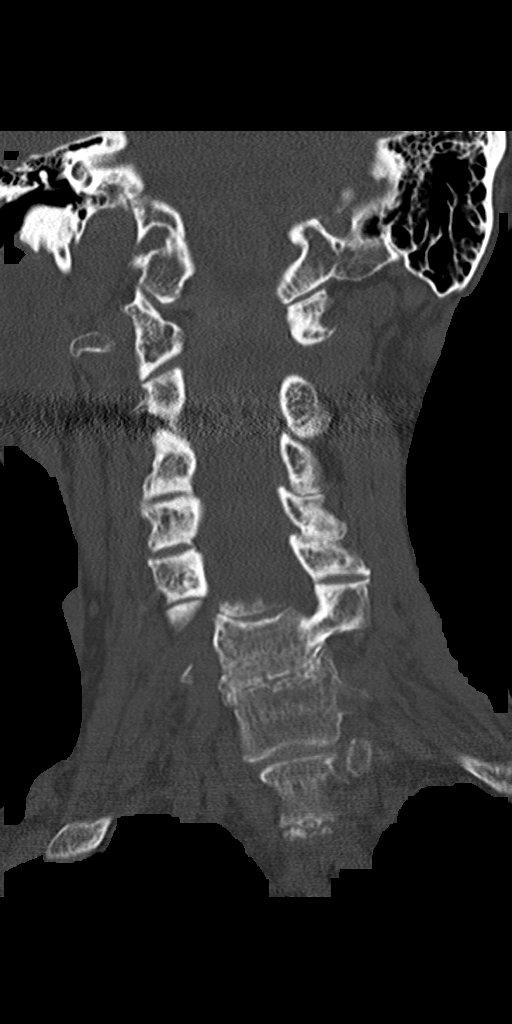
[im 41/69  bone]
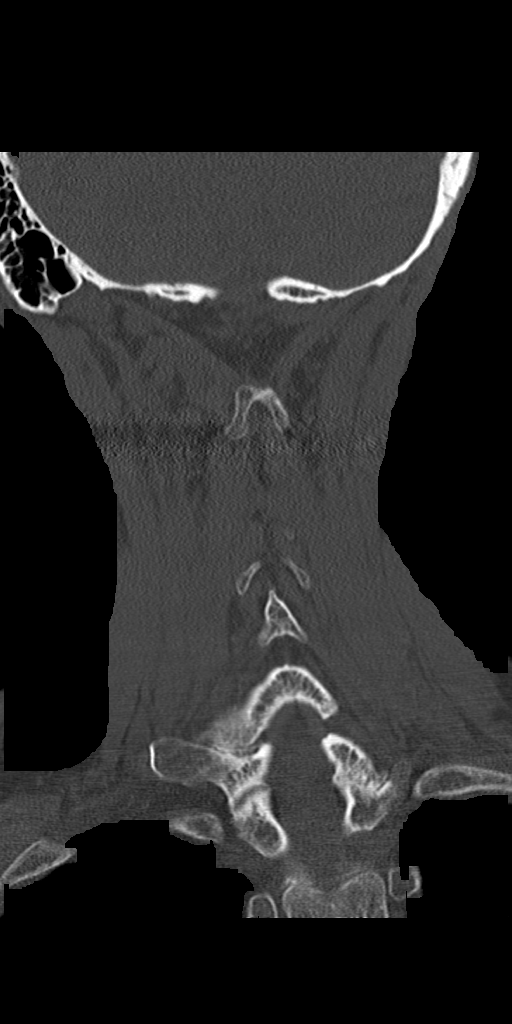

[Series 8: orthogonal axials · axial · 0.23mm/px · z∈[+1314,+1314]mm · 1 of 109 slices shown, 2 images]
[im 55/109  soft-tissue]
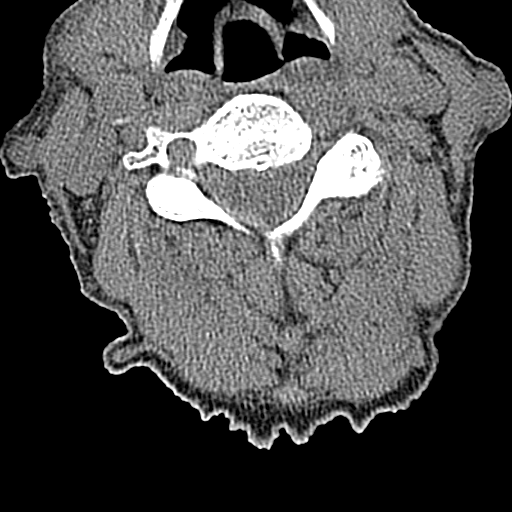
[im 55/109  bone]
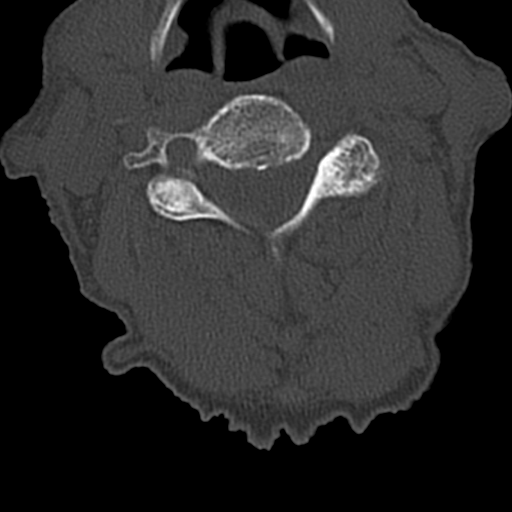

[9 of 33 positions shown; findings below may reference images not displayed]

FINDINGS: Alignment: Normal.

Skull base and vertebrae: No acute fracture. No primary bone lesion
or focal pathologic process.

Soft tissues and spinal canal: No prevertebral fluid or swelling. No
visible canal hematoma.

Disc levels: Mild anterior osteophyte formation is noted at the
level of C2-C3 with mild to moderate severity anterior osteophyte
formation seen at the level of C5-C6. Moderate severity endplate
sclerosis is seen at the level of C6-C7.

Marked severity intervertebral disc space narrowing is seen at the
level of C6-C7. Multilevel posterior intervertebral disc space
narrowing is seen throughout the remainder of the cervical spine.

Mild, bilateral multilevel facet joint hypertrophy is noted.

Upper chest: Mild biapical scarring and/or atelectasis is seen.

Other: None.
IMPRESSION: 1. Multilevel degenerative changes, most prominent at the level of
C6-C7.
2. No evidence of an acute fracture or subluxation.

## 2020-05-07 IMAGING — CT CT MAXILLOFACIAL W/O CM
3 series · 14 of 47 positions shown, 16 images · non-contrast
Comparison: None.

CLINICAL DATA: Status post fall.

EXAM:
CT MAXILLOFACIAL WITHOUT CONTRAST
TECHNIQUE: Multidetector CT imaging of the maxillofacial structures was
performed. Multiplanar CT image reconstructions were also generated.

[Series 1: max soft · axial · 0.41mm/px · z∈[+1306,+1458]mm · 8 of 90 slices shown, 10 images]
[im 7/90  brain]
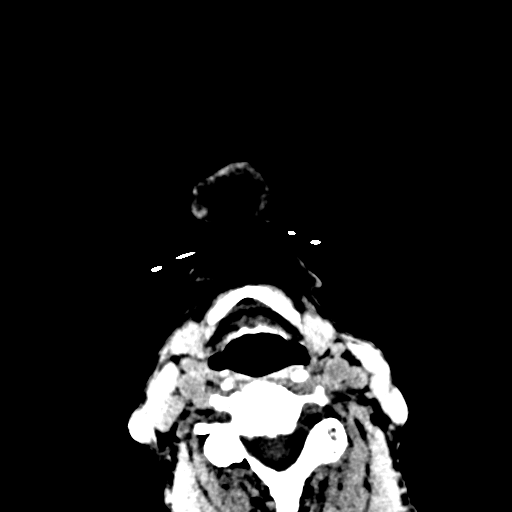
[im 7/90  bone]
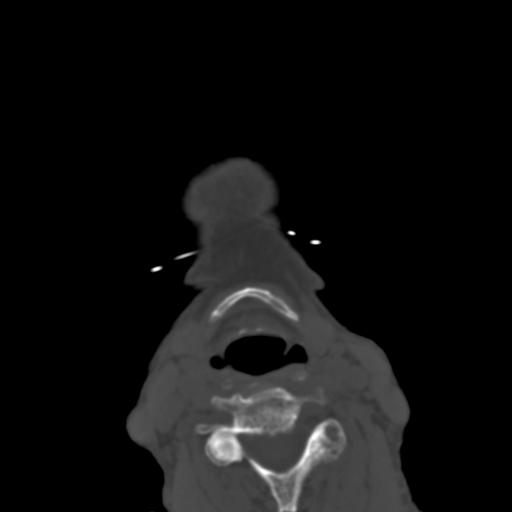
[im 19/90  bone]
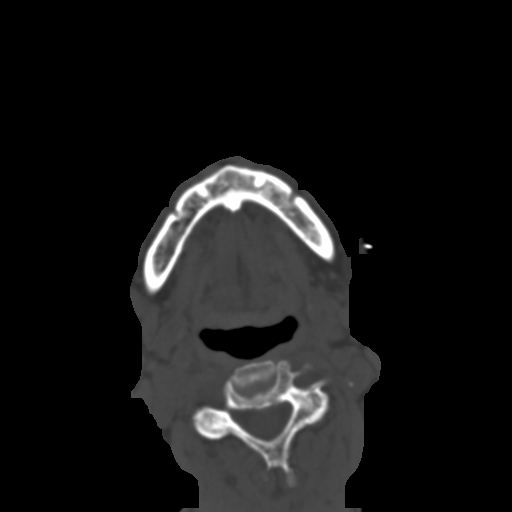
[im 28/90  bone]
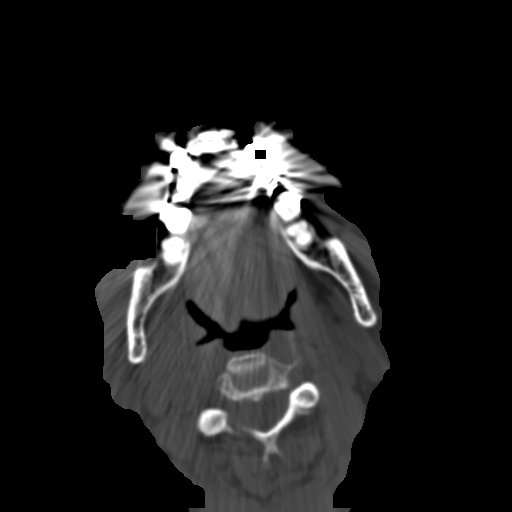
[im 40/90  bone]
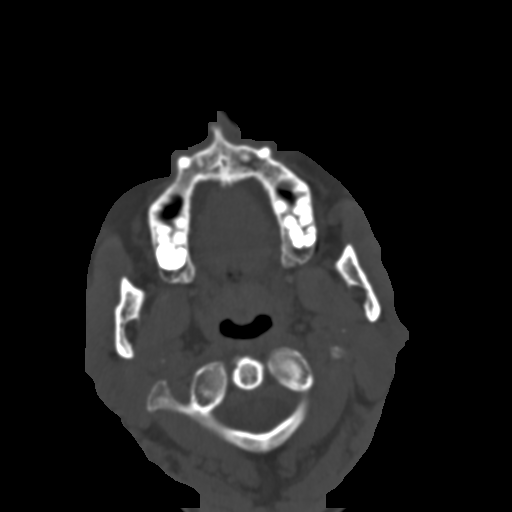
[im 50/90  brain]
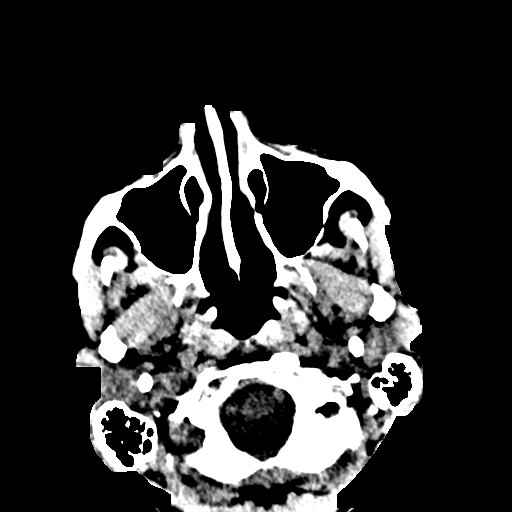
[im 50/90  bone]
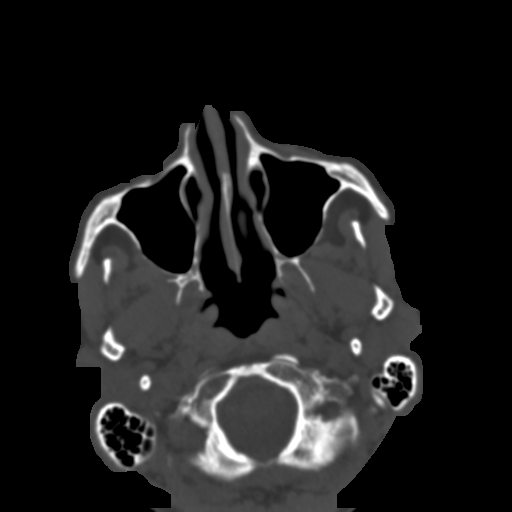
[im 62/90  bone]
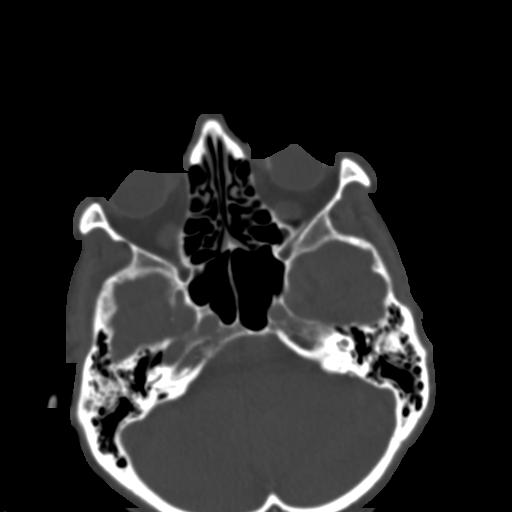
[im 71/90  bone]
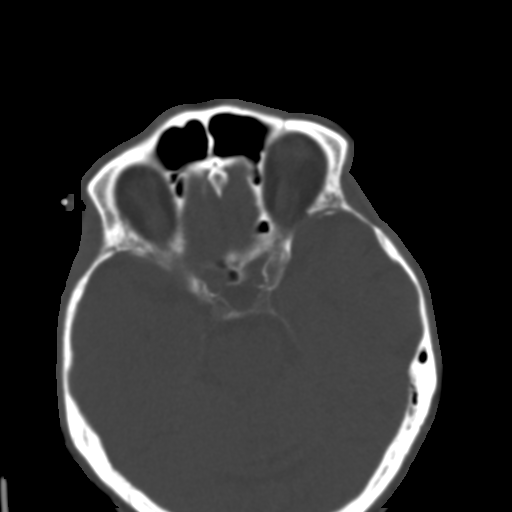
[im 83/90  bone]
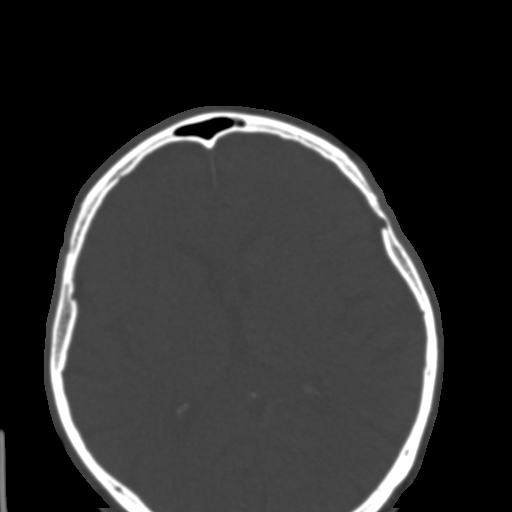

[Series 7: coronal soft · coronal · 0.36mm/px · 3 of 89 slices shown]
[im 30/89  bone]
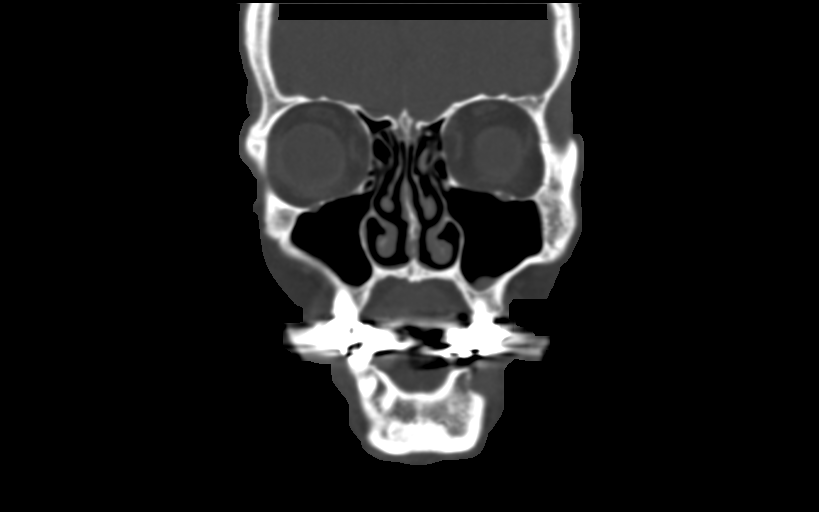
[im 40/89  bone]
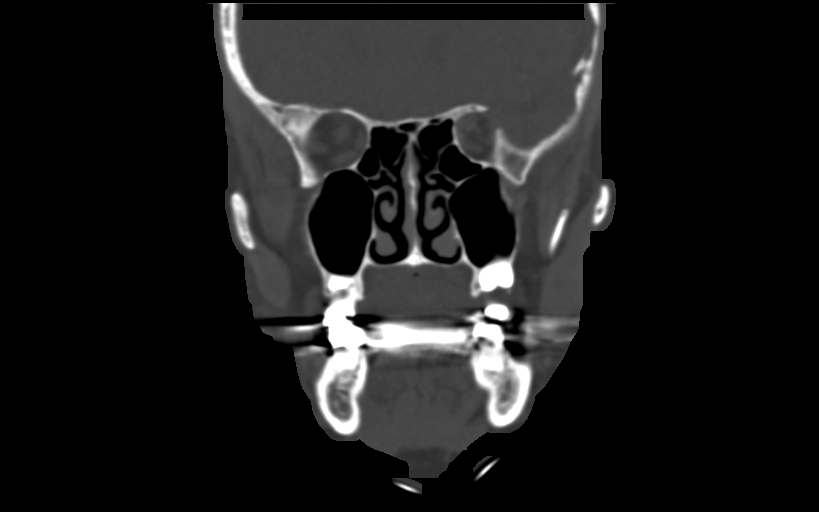
[im 49/89  bone]
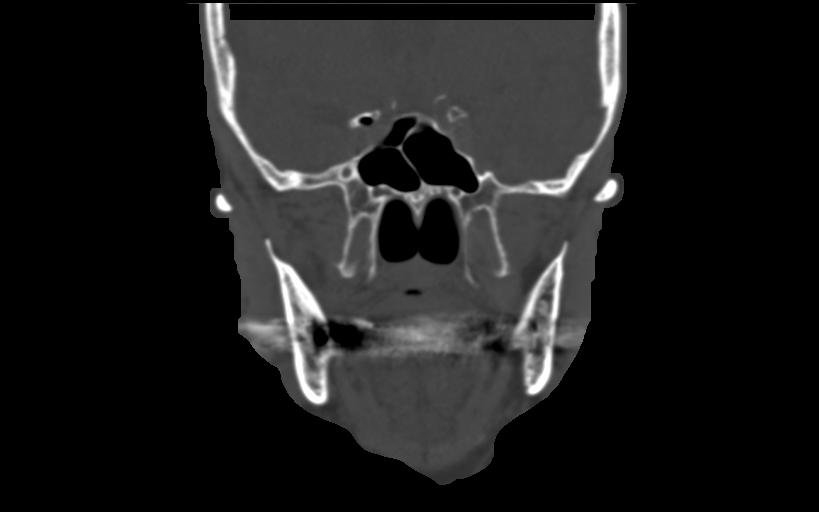

[Series 8: sagittal soft · sagittal · 0.37mm/px · 3 of 81 slices shown]
[im 27/81  bone]
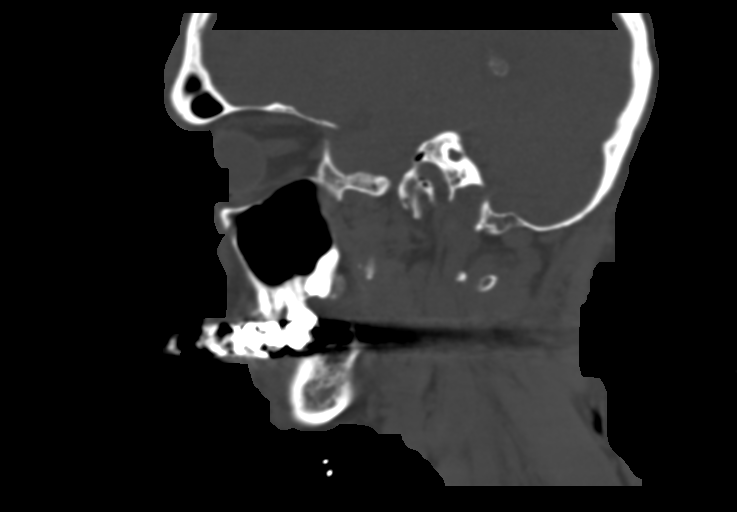
[im 41/81  bone]
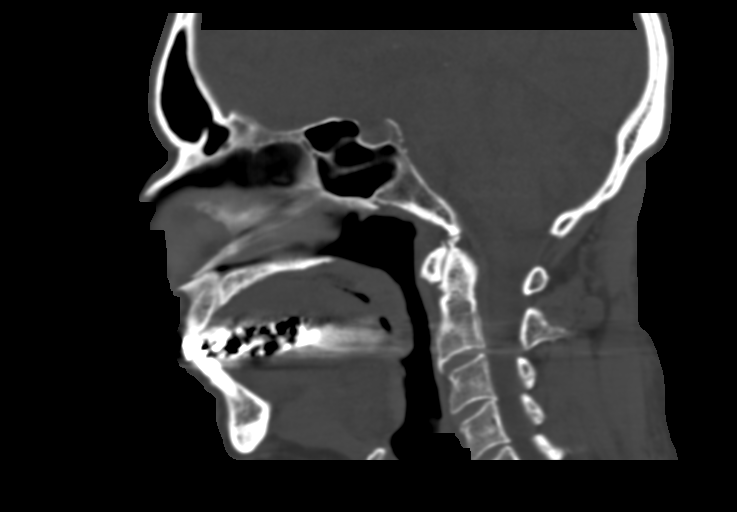
[im 54/81  bone]
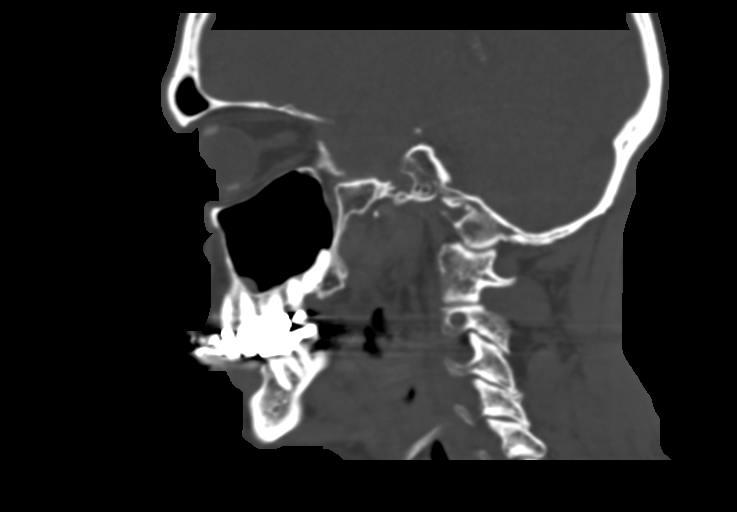

[14 of 47 positions shown; findings below may reference images not displayed]

FINDINGS: Osseous: No fracture or mandibular dislocation. No destructive
process.

Orbits: No traumatic or inflammatory finding. Evidence of prior
scleral banding is seen on the left.

Sinuses: Clear.

Soft tissues: Mild right supra orbital and mild to moderate severity
right frontal scalp soft tissue swelling is seen.

Limited intracranial: No significant or unexpected finding.
IMPRESSION: 1. Mild right supra orbital and mild to moderate severity right
frontal scalp soft tissue swelling.
2. No evidence of acute fracture or mandibular dislocation.

## 2020-05-07 MED ORDER — TETANUS-DIPHTH-ACELL PERTUSSIS 5-2.5-18.5 LF-MCG/0.5 IM SUSY
0.5000 mL | PREFILLED_SYRINGE | Freq: Once | INTRAMUSCULAR | Status: AC
  Administered 2020-05-07: 0.5 mL via INTRAMUSCULAR
  Filled 2020-05-07: qty 0.5

## 2020-05-07 MED ORDER — LIDOCAINE-EPINEPHRINE (PF) 2 %-1:200000 IJ SOLN
10.0000 mL | Freq: Once | INTRAMUSCULAR | Status: AC
  Administered 2020-05-07: 10 mL via INTRADERMAL
  Filled 2020-05-07: qty 20

## 2020-05-07 NOTE — ED Notes (Signed)
Areas on head and bil knees cleaned with NS, Nonstick dressing applied.

## 2020-05-07 NOTE — ED Provider Notes (Addendum)
[facility] DEPT Provider Note   CSN: [id number] Arrival date & time: 05/07/20  1349     History Chief Complaint  Patient presents with  . Fall  . Laceration    Alec Weiss is a 87 y.o. male.  HPI   88 year old male with a history of actinic keratosis, insomnia, lumbago, retinal detachment, who presents to the emergency department today for evaluation after a fall.  States he was walking when he tripped over his feet and fell forward hitting his head on the ground.  He states he caught himself with his hands and his knees and he has some abrasions to his knees but no significant pain.  He denies any neck or back pain.  He is not sure when his last tetanus shot was.  He is not anticoagulated.  Past Medical History:  Diagnosis Date  . Actinic keratosis 04/05/2012  . Insomnia, unspecified 06/08/2008  . Lumbago 01/02/2003  . 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 Pain in limb 02/05/2011  . Retinal detachment 2000   left eye  . Screening cholesterol level   . Shortness of breath 09/07/2008  . Vitamin D deficiency 08/04/2012    Patient Active Problem List   Diagnosis Date Noted  . Mild aortic stenosis by prior echocardiogram 12/21/2019  . Varicose veins of leg with edema, bilateral 12/21/2019  . Ventral hernia without obstruction or gangrene 12/21/2019  . Tinea corporis 12/21/2019  . Right hip crepitus 08/04/2019  . Loose stools 08/04/2019  . Anemia 12/09/2017  . 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 Detached retina 11/28/2015  . Aortic systolic murmur on examination 11/28/2015  . Insomnia 03/24/2013  . Pain in joint, shoulder region 09/13/2012  . Vitamin D deficiency 08/04/2012  . Actinic keratosis 04/05/2012    Past Surgical History:  Procedure Laterality Date  . EXTERNAL EAR SURGERY  05/2012   left ear   . INGUINAL HERNIA REPAIR Right 01/21/2005   Dr. [name]  . LID LESION EXCISION  2009  . MOHS SURGERY Left 05/28/2012   ear lobe  Dr. [name]  . RETINAL DETACHMENT SURGERY Left [facility]       Family History  Problem Relation Age of Onset  . Cancer Mother        breast    Social History   Tobacco Use  . Smoking status: Never Smoker  . Smokeless tobacco: Never Used  Substance Use Topics  . Alcohol use: Yes    Alcohol/week: 1.0 standard drink    Types: 1 Glasses of wine per week    Comment: at dinner every night.  . Drug use: No    Home Medications Prior to Admission medications   Medication Sig Start Date End Date Taking? Authorizing Provider  clotrimazole (LOTRIMIN) 1 % cream Apply 1 application topically 2 (two) times daily. To left knee for two dry scaly areas until resolved 02/08/20   Reed, Tiffany L, DO  iron polysaccharides (NU-IRON) 150 MG capsule Take 1 capsule (150 mg total) by mouth daily. 01/16/20   Reed, Tiffany L, DO  vitamin B-12 (CYANOCOBALAMIN) 1000 MCG tablet Take 1 tablet (1,000 mcg total) by mouth daily. 01/16/20   Reed, Tiffany L, DO  Vitamin D, Ergocalciferol, (DRISDOL) 1.25 MG (50000 UNIT) CAPS capsule TAKE ONE CAPSULE ONCE A WEEK FOR VITAMIN D SUPPLEMENT. 12/21/19   Reed, Tiffany L, DO    Allergies    Benzalkonium chloride, Maxitrol [neomycin-polymyxin-dexameth], and Neosporin [neomycin-bacitracin zn-polymyx]  Review of Systems  Review of Systems  Constitutional: Negative for fever.  HENT: Negative for dental problem.   Eyes: Negative for visual disturbance.  Respiratory: Negative for shortness of breath.   Cardiovascular: Negative for chest pain.  Gastrointestinal: Negative for abdominal pain, nausea and vomiting.  Genitourinary: Negative for flank pain.  Musculoskeletal: Negative for back pain and neck pain.  Skin: Positive for wound.  Neurological: Negative for weakness, numbness and headaches.       Head injury, no loc  All other systems reviewed and are negative.   Physical Exam Updated Vital Signs BP (!) 157/85 (BP Location: Right Arm)   Pulse 65    Temp 97.8 F (36.6 C) (Oral)   Resp 20   Ht 5\' 11"  (1.803 m)   Wt 70.3 kg   SpO2 100%   BMI 21.62 kg/m   Physical Exam Vitals and nursing note reviewed.  Constitutional:      Appearance: He is well-developed and well-nourished.  HENT:     Head: Normocephalic.     Comments: Large abrasion to the right forehead with small .5 cm laceration noted Eyes:     Conjunctiva/sclera: Conjunctivae normal.  Cardiovascular:     Rate and Rhythm: Normal rate and regular rhythm.     Heart sounds: No murmur heard.   Pulmonary:     Effort: Pulmonary effort is normal. No respiratory distress.     Breath sounds: Normal breath sounds.  Abdominal:     Palpations: Abdomen is soft.     Tenderness: There is no abdominal tenderness.  Musculoskeletal:        General: No edema.     Cervical back: Neck supple.     Comments: Abrasions to the bilat knee with no ttp. No ttp to the ctl spine.  Skin:    General: Skin is warm and dry.  Neurological:     Mental Status: He is alert.     Comments: Mental Status:  Alert, thought content appropriate, able to give a coherent history. Speech fluent without evidence of aphasia. Able to follow 2 step commands without difficulty.  Cranial Nerves:  II: pupils equal, round, reactive to light III,IV, VI: ptosis not present, extra-ocular motions intact bilaterally  V,VII: smile symmetric, facial light touch sensation equal VIII: hearing grossly normal to voice  X: uvula elevates symmetrically  XI: bilateral shoulder shrug symmetric and strong XII: midline tongue extension without fassiculations Motor:  Normal tone. 5/5 strength of BUE and BLE major muscle groups including strong and equal grip strength and dorsiflexion/plantar flexion Sensory: light touch normal in all extremities.   Psychiatric:        Mood and Affect: Mood and affect normal.     ED Results / Procedures / Treatments   Labs (all labs ordered are listed, but only abnormal results are  displayed) Labs Reviewed - No data to display  EKG None  Radiology CT Head Wo Contrast  Result Date: 05/07/2020 CLINICAL DATA:  Status post trauma. EXAM: CT HEAD WITHOUT CONTRAST TECHNIQUE: Contiguous axial images were obtained from the base of the skull through the vertex without intravenous contrast. COMPARISON:  None. FINDINGS: Brain: There is mild cerebral atrophy with widening of the extra-axial spaces and ventricular dilatation. There are areas of decreased attenuation within the white matter tracts of the supratentorial brain, consistent with microvascular disease changes. Vascular: No hyperdense vessel or unexpected calcification. Skull: Normal. Negative for fracture or focal lesion. Sinuses/Orbits: No acute finding. Prior scleral banding is noted on the left. Other: Mild to  moderate severity right frontal scalp soft tissue swelling is seen. IMPRESSION: Mild to moderate severity right frontal scalp soft tissue swelling without evidence of an acute fracture or acute intracranial abnormality. Electronically Signed   By: Virgina Norfolk M.D.   On: 05/07/2020 15:12   CT Cervical Spine Wo Contrast  Result Date: 05/07/2020 CLINICAL DATA:  Status post fall. EXAM: CT CERVICAL SPINE WITHOUT CONTRAST TECHNIQUE: Multidetector CT imaging of the cervical spine was performed without intravenous contrast. Multiplanar CT image reconstructions were also generated. COMPARISON:  None. FINDINGS: Alignment: Normal. Skull base and vertebrae: No acute fracture. No primary bone lesion or focal pathologic process. Soft tissues and spinal canal: No prevertebral fluid or swelling. No visible canal hematoma. Disc levels: Mild anterior osteophyte formation is noted at the level of C2-C3 with mild to moderate severity anterior osteophyte formation seen at the level of C5-C6. Moderate severity endplate sclerosis is seen at the level of C6-C7. Marked severity intervertebral disc space narrowing is seen at the level of C6-C7.  Multilevel posterior intervertebral disc space narrowing is seen throughout the remainder of the cervical spine. Mild, bilateral multilevel facet joint hypertrophy is noted. Upper chest: Mild biapical scarring and/or atelectasis is seen. Other: None. IMPRESSION: 1. Multilevel degenerative changes, most prominent at the level of C6-C7. 2. No evidence of an acute fracture or subluxation. Electronically Signed   By: Virgina Norfolk M.D.   On: 05/07/2020 15:29   CT Maxillofacial Wo Contrast  Result Date: 05/07/2020 CLINICAL DATA:  Status post fall. EXAM: CT MAXILLOFACIAL WITHOUT CONTRAST TECHNIQUE: Multidetector CT imaging of the maxillofacial structures was performed. Multiplanar CT image reconstructions were also generated. COMPARISON:  None. FINDINGS: Osseous: No fracture or mandibular dislocation. No destructive process. Orbits: No traumatic or inflammatory finding. Evidence of prior scleral banding is seen on the left. Sinuses: Clear. Soft tissues: Mild right supra orbital and mild to moderate severity right frontal scalp soft tissue swelling is seen. Limited intracranial: No significant or unexpected finding. IMPRESSION: 1. Mild right supra orbital and mild to moderate severity right frontal scalp soft tissue swelling. 2. No evidence of acute fracture or mandibular dislocation. Electronically Signed   By: Virgina Norfolk M.D.   On: 05/07/2020 15:24    Procedures .Marland KitchenLaceration Repair  Date/Time: 05/07/2020 4:27 PM Performed by: Rodney Booze, PA-C Authorized by: Rodney Booze, PA-C   Consent:    Consent obtained:  Verbal   Consent given by:  Patient   Risks, benefits, and alternatives were discussed: yes     Risks discussed:  Infection, pain and need for additional repair   Alternatives discussed:  No treatment Universal protocol:    Procedure explained and questions answered to patient or proxy's satisfaction: yes     Relevant documents present and verified: yes     Test results  available: yes     Imaging studies available: yes     Required blood products, implants, devices, and special equipment available: yes     Site/side marked: yes     Immediately prior to procedure, a time out was called: yes     Patient identity confirmed:  Verbally with patient Anesthesia:    Anesthesia method:  Local infiltration   Local anesthetic:  Lidocaine 2% WITH epi Laceration details:    Location:  Face   Face location:  Forehead   Length (cm):  1 Pre-procedure details:    Preparation:  Patient was prepped and draped in usual sterile fashion and imaging obtained to evaluate for foreign bodies  Exploration:    Limited defect created (wound extended): no     Hemostasis achieved with:  Direct pressure and epinephrine   Wound exploration: wound explored through full range of motion and entire depth of wound visualized     Contaminated: no   Treatment:    Area cleansed with:  Saline   Amount of cleaning:  Standard   Irrigation solution:  Sterile saline   Irrigation method:  Pressure wash   Visualized foreign bodies/material removed: no     Debridement:  None   Undermining:  None   Scar revision: no   Skin repair:    Repair method:  Sutures   Suture size:  5-0   Wound skin closure material used: vicryl rapide.   Number of sutures:  2 Approximation:    Approximation:  Close Repair type:    Repair type:  Simple Post-procedure details:    Dressing:  Antibiotic ointment and non-adherent dressing   Procedure completion:  Tolerated     Medications Ordered in ED Medications  Tdap (BOOSTRIX) injection 0.5 mL (0.5 mLs Intramuscular Given 05/07/20 1519)  lidocaine-EPINEPHrine (XYLOCAINE W/EPI) 2 %-1:200000 (PF) injection 10 mL (10 mLs Intradermal Given by Other 05/07/20 1603)    ED Course  I have reviewed the triage vital signs and the nursing notes.  Pertinent labs & imaging results that were available during my care of the patient were reviewed by me and considered in my  medical decision making (see chart for details).    MDM Rules/Calculators/A&P                          87 y/o M presenting to the ED after a mechanical fall. Sustained head trauma but did not have loc and is not anticoagulated  Reviewed/interpreted imaging CT head - Mild to moderate severity right frontal scalp soft tissue swelling without evidence of an acute fracture or acute intracranial abnormality. CT maxillofacial -  1. Mild right supra orbital and mild to moderate severity right frontal scalp soft tissue swelling. 2. No evidence of acute fracture or mandibular dislocation. CT cervical spine -  1. Multilevel degenerative changes, most prominent at the level of C6-C7. 2. No evidence of an acute fracture or subluxation.   Pressure irrigation performed. Wound explored and base of wound visualized in a bloodless field without evidence of foreign body.  Laceration occurred < 8 hours prior to repair which was well tolerated.  Tdap updated.  Pt has  no comorbidities to effect normal wound healing. Pt discharged  without antibiotics.  Discussed suture home care with patient and answered questions. Suture are absorbable and do not need to be removed; they are to return to the ED sooner for signs of infection. Pt is hemodynamically stable with no complaints prior to dc.    Final Clinical Impression(s) / ED Diagnoses Final diagnoses:  Fall, initial encounter  Injury of head, initial encounter  Facial laceration, initial encounter    Rx / DC Orders ED Discharge Orders    None       Rodney Booze, PA-C 05/07/20 1628    Rodney Booze, PA-C 05/07/20 1630    Tegeler, Gwenyth Allegra, MD 05/08/20 1316

## 2020-05-07 NOTE — ED Triage Notes (Signed)
Pt BIB by [facility] for witnessed standing fall while leaving restaurant. No LOC, no blood thinners. Denies NV, dizziness. Inch long abrasion to forehead with contusion. Skin tear to right hand and both knees. All bleeding controlled upon arrival and bandaged en route. No other complaints. VSS. Pain rated 2/10.  BP 140/98 HR 70 SpO2 99%% RA RR 16

## 2020-05-07 NOTE — Discharge Instructions (Signed)
Please keep the wounds clean and dry. You had sutures placed to your forehead. These area absorbable and will not need to be removed.  Please follow up with your primary care provider within 5-7 days for re-evaluation of your symptoms. If you do not have a primary care provider, information for a healthcare clinic has been provided for you to make arrangements for follow up care. Please return to the emergency department for any new or worsening symptoms.

## 2020-05-17 DIAGNOSIS — D513 Other dietary vitamin B12 deficiency anemia: Secondary | ICD-10-CM | POA: Diagnosis not present

## 2020-05-17 DIAGNOSIS — D649 Anemia, unspecified: Secondary | ICD-10-CM | POA: Diagnosis not present

## 2020-05-17 DIAGNOSIS — E539 Vitamin B deficiency, unspecified: Secondary | ICD-10-CM | POA: Diagnosis not present

## 2020-05-17 LAB — VITAMIN B12: Vitamin B-12: 522

## 2020-05-17 LAB — CBC AND DIFFERENTIAL
HCT: 30 — AB (ref 41–53)
Hemoglobin: 10.2 — AB (ref 13.5–17.5)
Platelets: 360 (ref 150–399)
WBC: 4.4

## 2020-05-17 LAB — CBC: RBC: 3.65 — AB (ref 3.87–5.11)

## 2020-05-17 LAB — IRON,TIBC AND FERRITIN PANEL: Iron: 26

## 2020-05-18 ENCOUNTER — Encounter: Payer: Self-pay | Admitting: *Deleted

## 2020-05-23 ENCOUNTER — Encounter: Payer: Self-pay | Admitting: Internal Medicine

## 2020-05-28 ENCOUNTER — Other Ambulatory Visit: Payer: Self-pay | Admitting: *Deleted

## 2020-05-28 ENCOUNTER — Encounter: Payer: Self-pay | Admitting: Adult Health

## 2020-05-28 ENCOUNTER — Other Ambulatory Visit: Payer: Self-pay

## 2020-05-28 ENCOUNTER — Non-Acute Institutional Stay: Payer: Medicare PPO | Admitting: Adult Health

## 2020-05-28 VITALS — BP 134/78 | HR 83 | Temp 97.9°F | Ht 71.0 in | Wt 150.6 lb

## 2020-05-28 DIAGNOSIS — I350 Nonrheumatic aortic (valve) stenosis: Secondary | ICD-10-CM

## 2020-05-28 DIAGNOSIS — E538 Deficiency of other specified B group vitamins: Secondary | ICD-10-CM

## 2020-05-28 DIAGNOSIS — D509 Iron deficiency anemia, unspecified: Secondary | ICD-10-CM

## 2020-05-28 DIAGNOSIS — R195 Other fecal abnormalities: Secondary | ICD-10-CM

## 2020-05-28 DIAGNOSIS — B354 Tinea corporis: Secondary | ICD-10-CM

## 2020-05-28 MED ORDER — CLOTRIMAZOLE 1 % EX CREA: 1.0000 "application " | TOPICAL_CREAM | Freq: Two times a day (BID) | CUTANEOUS | 0 refills | Status: DC

## 2020-05-28 MED ORDER — POLYSACCHARIDE IRON COMPLEX 150 MG PO CAPS: 150.0000 mg | ORAL_CAPSULE | Freq: Every day | ORAL | 3 refills | Status: DC

## 2020-05-28 MED ORDER — VITAMIN D (ERGOCALCIFEROL) 1.25 MG (50000 UNIT) PO CAPS: ORAL_CAPSULE | ORAL | 3 refills | Status: DC

## 2020-05-28 NOTE — Progress Notes (Addendum)
Location:  [employer] [facility] [phone])  Provider: Cleophus Molt  Code Status: DNR Goals of Care:  Advanced Directives 05/28/2020  Does Patient Have a Medical Advance Directive? Yes  Type of Advance Directive Out of facility DNR (pink MOST or yellow form)  Does patient want to make changes to medical advance directive? No - Patient declined  Copy of [place] in Chart? -  Pre-existing out of facility DNR order (yellow form or pink MOST form) -     Chief Complaint  Patient presents with  . Medical Management of Chronic Issues    Medical Management of Chronic Issues. 3 Month Follow up    HPI: Patient is a 87 y.o. male seen today for medical management of chronic diseases.   He reports he had a mechanical fall and was in the ED on 3/7.  He had a bump to the head with laceration repair. CT of the head and cervical neck indicated no acute issues. Multilevel degenerative changes were seen at C6-7. He denies any headache, dizziness, or other symptoms. He has not noted any blood in stool and is not having abd pain. He has iron def anemia.  Lab Results  Component Value Date   HGB 10.2 (A) 05/17/2020  He is on Nu iron but takes it with food.  CT of the abd on 01/10/20 showed no acute findings to indicated why he has IDA. He declined further work up due to his age of 89.  He denies any abd pain or change in bowel habits.  He has some soft stools while taking metamucil. Denies any constipation or diarrhea.  Of note he has lost 7  Lbs in the past 4 months but he says his weight fluctuates.    Past Medical History:  Diagnosis Date  . Actinic keratosis 04/05/2012  . Insomnia, unspecified 06/08/2008  . Lumbago 01/02/2003  . 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 Pain in limb 02/05/2011  . Retinal detachment 2000   left eye  . Screening cholesterol level   . Shortness of breath 09/07/2008  . Vitamin D deficiency 08/04/2012    Past Surgical  History:  Procedure Laterality Date  . EXTERNAL EAR SURGERY  05/2012   left ear   . INGUINAL HERNIA REPAIR Right 01/21/2005   Dr. [name]  . LID LESION EXCISION  2009  . MOHS SURGERY Left 05/28/2012   ear lobe Dr. [name]  . RETINAL DETACHMENT SURGERY Left [facility]    Allergies  Allergen Reactions  . Benzalkonium Chloride Rash    Very allergic per patient   . Maxitrol [Neomycin-Polymyxin-Dexameth] Itching and Rash  . Neosporin [Neomycin-Bacitracin Zn-Polymyx] Itching and Rash    Outpatient Encounter Medications as of 05/28/2020  Medication Sig  . clotrimazole (LOTRIMIN) 1 % cream Apply 1 application topically 2 (two) times daily. To left knee for two dry scaly areas until resolved  . iron polysaccharides (NU-IRON) 150 MG capsule Take 1 capsule (150 mg total) by mouth daily.  . vitamin B-12 (CYANOCOBALAMIN) 1000 MCG tablet Take 1 tablet (1,000 mcg total) by mouth daily.  . Vitamin D, Ergocalciferol, (DRISDOL) 1.25 MG (50000 UNIT) CAPS capsule TAKE ONE CAPSULE ONCE A WEEK FOR VITAMIN D SUPPLEMENT.   No facility-administered encounter medications on file as of 05/28/2020.    Review of Systems:  Review of Systems  Constitutional: Negative for activity change, appetite change, chills, diaphoresis, fatigue, fever and unexpected weight change.  Respiratory: Negative for  cough, shortness of breath, wheezing and stridor.   Cardiovascular: Negative for chest pain, palpitations and leg swelling.  Gastrointestinal: Negative for abdominal distention, abdominal pain, constipation and diarrhea.       Has soft stools but not loose   Genitourinary: Negative for difficulty urinating and dysuria.  Musculoskeletal: Negative for arthralgias, back pain, gait problem, joint swelling and myalgias.  Skin: Positive for wound (on both knees due to fall ).  Neurological: Negative for dizziness, seizures, syncope, facial asymmetry, speech difficulty, weakness and headaches.  Hematological:  Negative for adenopathy. Does not bruise/bleed easily.  Psychiatric/Behavioral: Negative for agitation, behavioral problems and confusion.    Health Maintenance  Topic Date Due  . COVID-19 Vaccine (4 - Booster) 07/16/2020  . TETANUS/TDAP  05/08/2030  . INFLUENZA VACCINE  Completed  . PNA vac Low Risk Adult  Completed  . HPV VACCINES  Aged Out    Physical Exam: Vitals:   05/28/20 1401  BP: 134/78  Pulse: 83  Temp: 97.9 F (36.6 C)  TempSrc: Skin  SpO2: 99%  Weight: 150 lb 9.6 oz (68.3 kg)  Height: 5\' 11"  (1.803 m)   Body mass index is 21 kg/m.  Wt Readings from Last 3 Encounters:  05/28/20 150 lb 9.6 oz (68.3 kg)  05/07/20 155 lb (70.3 kg)  02/08/20 157 lb (71.2 kg)    Physical Exam Vitals and nursing note reviewed.  Constitutional:      General: He is not in acute distress.    Appearance: He is not diaphoretic.  HENT:     Head: Normocephalic and atraumatic.  Neck:     Thyroid: No thyromegaly.     Vascular: No JVD.     Trachea: No tracheal deviation.  Cardiovascular:     Rate and Rhythm: Normal rate and regular rhythm.     Heart sounds: Murmur heard.    Pulmonary:     Effort: Pulmonary effort is normal. No respiratory distress.     Breath sounds: Normal breath sounds. No wheezing.  Abdominal:     General: Bowel sounds are normal. There is no distension.     Palpations: Abdomen is soft.     Tenderness: There is no abdominal tenderness.  Musculoskeletal:     Right lower leg: No edema.     Left lower leg: No edema.  Lymphadenopathy:     Cervical: No cervical adenopathy.  Skin:    General: Skin is warm and dry.     Findings: Bruising (right forehead and small bruising to both lower ext) present.     Comments: Abrasions to both knees covered with polymem Healing abrasion to the right forehead area.   Neurological:     Mental Status: He is alert and oriented to person, place, and time.     Cranial Nerves: No cranial nerve deficit.     Labs  reviewed: Basic Metabolic Panel: Recent Labs    12/22/19 0000  NA 137  K 4.5  CL 101  CO2 29*  BUN 16  CREATININE 0.8  CALCIUM 9.1   Liver Function Tests: Recent Labs    12/22/19 0000  AST 18  ALT 10  ALBUMIN 3.8   No results for input(s): LIPASE, AMYLASE in the last 8760 hours. No results for input(s): AMMONIA in the last 8760 hours. CBC: Recent Labs    12/22/19 0000 05/17/20 0000  WBC 4.6 4.4  HGB 10.7* 10.2*  HCT 33* 30*  PLT 326 360   Lipid Panel: No results for input(s): CHOL, HDL,  LDLCALC, TRIG, CHOLHDL, LDLDIRECT in the last 8760 hours. Lab Results  Component Value Date   HGBA1C 5.9 03/09/2012    Procedures since last visit: CT Head Wo Contrast  Result Date: 05/07/2020 CLINICAL DATA:  Status post trauma. EXAM: CT HEAD WITHOUT CONTRAST TECHNIQUE: Contiguous axial images were obtained from the base of the skull through the vertex without intravenous contrast. COMPARISON:  None. FINDINGS: Brain: There is mild cerebral atrophy with widening of the extra-axial spaces and ventricular dilatation. There are areas of decreased attenuation within the white matter tracts of the supratentorial brain, consistent with microvascular disease changes. Vascular: No hyperdense vessel or unexpected calcification. Skull: Normal. Negative for fracture or focal lesion. Sinuses/Orbits: No acute finding. Prior scleral banding is noted on the left. Other: Mild to moderate severity right frontal scalp soft tissue swelling is seen. IMPRESSION: Mild to moderate severity right frontal scalp soft tissue swelling without evidence of an acute fracture or acute intracranial abnormality. Electronically Signed   By: Virgina Norfolk M.D.   On: 05/07/2020 15:12   CT Cervical Spine Wo Contrast  Result Date: 05/07/2020 CLINICAL DATA:  Status post fall. EXAM: CT CERVICAL SPINE WITHOUT CONTRAST TECHNIQUE: Multidetector CT imaging of the cervical spine was performed without intravenous contrast.  Multiplanar CT image reconstructions were also generated. COMPARISON:  None. FINDINGS: Alignment: Normal. Skull base and vertebrae: No acute fracture. No primary bone lesion or focal pathologic process. Soft tissues and spinal canal: No prevertebral fluid or swelling. No visible canal hematoma. Disc levels: Mild anterior osteophyte formation is noted at the level of C2-C3 with mild to moderate severity anterior osteophyte formation seen at the level of C5-C6. Moderate severity endplate sclerosis is seen at the level of C6-C7. Marked severity intervertebral disc space narrowing is seen at the level of C6-C7. Multilevel posterior intervertebral disc space narrowing is seen throughout the remainder of the cervical spine. Mild, bilateral multilevel facet joint hypertrophy is noted. Upper chest: Mild biapical scarring and/or atelectasis is seen. Other: None. IMPRESSION: 1. Multilevel degenerative changes, most prominent at the level of C6-C7. 2. No evidence of an acute fracture or subluxation. Electronically Signed   By: Virgina Norfolk M.D.   On: 05/07/2020 15:29   CT Maxillofacial Wo Contrast  Result Date: 05/07/2020 CLINICAL DATA:  Status post fall. EXAM: CT MAXILLOFACIAL WITHOUT CONTRAST TECHNIQUE: Multidetector CT imaging of the maxillofacial structures was performed. Multiplanar CT image reconstructions were also generated. COMPARISON:  None. FINDINGS: Osseous: No fracture or mandibular dislocation. No destructive process. Orbits: No traumatic or inflammatory finding. Evidence of prior scleral banding is seen on the left. Sinuses: Clear. Soft tissues: Mild right supra orbital and mild to moderate severity right frontal scalp soft tissue swelling is seen. Limited intracranial: No significant or unexpected finding. IMPRESSION: 1. Mild right supra orbital and mild to moderate severity right frontal scalp soft tissue swelling. 2. No evidence of acute fracture or mandibular dislocation. Electronically Signed   By:  Virgina Norfolk M.D.   On: 05/07/2020 15:24    Assessment/Plan  1. Iron deficiency anemia, unspecified iron deficiency anemia type Unchanged He should try to take Nu Iron on an empty stomach to increase absorption. If he can not tolerate this due to GI upset he can go back to taking it with food.  He should come back in three months for another CBC with iron panel. Could consider changing to ferrous sulfate.  Also could consider PPI therapy. We discussed that he could have a slow loss of blood due to  malignancy but he declined further work up as before   2. Loose stools Improved Continues on metamucil as needed   3. B12 deficiency Continue B12 supplementation   4. Mild aortic stenosis by prior echocardiogram Mild AS by echo done in 2018 but no current symptoms    Labs/tests ordered:  CBC and iron panel prior to apt Next appt:  3 months with Dr. Lyndel Safe

## 2020-05-28 NOTE — Telephone Encounter (Signed)
Per [employer] in #30 with 3 RF until next appointment of medications.  Sent to pharmacy.

## 2020-06-24 ENCOUNTER — Other Ambulatory Visit: Payer: Self-pay

## 2020-06-24 ENCOUNTER — Encounter (HOSPITAL_BASED_OUTPATIENT_CLINIC_OR_DEPARTMENT_OTHER): Payer: Self-pay

## 2020-06-24 ENCOUNTER — Emergency Department (HOSPITAL_BASED_OUTPATIENT_CLINIC_OR_DEPARTMENT_OTHER)
Admission: EM | Admit: 2020-06-24 | Discharge: 2020-06-24 | Disposition: A | Discharge status: Home / Self Care | Payer: Medicare PPO | Attending: Emergency Medicine | Admitting: Emergency Medicine

## 2020-06-24 ENCOUNTER — Emergency Department (HOSPITAL_BASED_OUTPATIENT_CLINIC_OR_DEPARTMENT_OTHER): Payer: Medicare PPO

## 2020-06-24 ENCOUNTER — Emergency Department (HOSPITAL_COMMUNITY): Payer: Medicare PPO

## 2020-06-24 DIAGNOSIS — Z85828 Personal history of other malignant neoplasm of skin: Secondary | ICD-10-CM | POA: Diagnosis not present

## 2020-06-24 DIAGNOSIS — R112 Nausea with vomiting, unspecified: Secondary | ICD-10-CM | POA: Insufficient documentation

## 2020-06-24 DIAGNOSIS — R011 Cardiac murmur, unspecified: Secondary | ICD-10-CM | POA: Insufficient documentation

## 2020-06-24 DIAGNOSIS — I6782 Cerebral ischemia: Secondary | ICD-10-CM | POA: Diagnosis not present

## 2020-06-24 DIAGNOSIS — Z20822 Contact with and (suspected) exposure to covid-19: Secondary | ICD-10-CM | POA: Diagnosis not present

## 2020-06-24 DIAGNOSIS — D649 Anemia, unspecified: Secondary | ICD-10-CM | POA: Diagnosis not present

## 2020-06-24 DIAGNOSIS — E871 Hypo-osmolality and hyponatremia: Secondary | ICD-10-CM | POA: Insufficient documentation

## 2020-06-24 DIAGNOSIS — R42 Dizziness and giddiness: Secondary | ICD-10-CM | POA: Diagnosis not present

## 2020-06-24 DIAGNOSIS — R197 Diarrhea, unspecified: Secondary | ICD-10-CM | POA: Insufficient documentation

## 2020-06-24 LAB — COMPREHENSIVE METABOLIC PANEL
ALT: 8 U/L (ref 0–44)
AST: 12 U/L — ABNORMAL LOW (ref 15–41)
Albumin: 3.7 g/dL (ref 3.5–5.0)
Alkaline Phosphatase: 66 U/L (ref 38–126)
Anion gap: 6 (ref 5–15)
BUN: 14 mg/dL (ref 8–23)
CO2: 27 mmol/L (ref 22–32)
Calcium: 8.8 mg/dL — ABNORMAL LOW (ref 8.9–10.3)
Chloride: 96 mmol/L — ABNORMAL LOW (ref 98–111)
Creatinine, Ser: 0.6 mg/dL — ABNORMAL LOW (ref 0.61–1.24)
GFR, Estimated: >60 mL/min (ref >60)
Glucose, Bld: 133 mg/dL — ABNORMAL HIGH (ref 70–99)
Potassium: 3.8 mmol/L (ref 3.5–5.1)
Sodium: 129 mmol/L — ABNORMAL LOW (ref 135–145)
Total Bilirubin: 0.4 mg/dL (ref 0.3–1.2)
Total Protein: 6.2 g/dL — ABNORMAL LOW (ref 6.5–8.1)

## 2020-06-24 LAB — CBC WITH DIFFERENTIAL/PLATELET
Abs Immature Granulocytes: 0.01 10*3/uL (ref 0.00–0.07)
Basophils Absolute: 0 10*3/uL (ref 0.0–0.1)
Basophils Relative: 1 %
Eosinophils Absolute: 0 10*3/uL (ref 0.0–0.5)
Eosinophils Relative: 0 %
HCT: 26.6 % — ABNORMAL LOW (ref 39.0–52.0)
Hemoglobin: 8.4 g/dL — ABNORMAL LOW (ref 13.0–17.0)
Immature Granulocytes: 0 %
Lymphocytes Relative: 12 %
Lymphs Abs: 0.6 10*3/uL — ABNORMAL LOW (ref 0.7–4.0)
MCH: 26 pg (ref 26.0–34.0)
MCHC: 31.6 g/dL (ref 30.0–36.0)
MCV: 82.4 fL (ref 80.0–100.0)
Monocytes Absolute: 0.5 10*3/uL (ref 0.1–1.0)
Monocytes Relative: 10 %
Neutro Abs: 3.7 10*3/uL (ref 1.7–7.7)
Neutrophils Relative %: 77 %
Platelets: 321 10*3/uL (ref 150–400)
RBC: 3.23 MIL/uL — ABNORMAL LOW (ref 4.22–5.81)
RDW: 14.3 % (ref 11.5–15.5)
WBC: 4.8 10*3/uL (ref 4.0–10.5)
nRBC: 0 % (ref 0.0–0.2)

## 2020-06-24 LAB — SARS CORONAVIRUS 2 (TAT 6-24 HRS): SARS Coronavirus 2: NEGATIVE

## 2020-06-24 LAB — TROPONIN I (HIGH SENSITIVITY): Troponin I (High Sensitivity): 3 ng/L (ref <18)

## 2020-06-24 LAB — OCCULT BLOOD X 1 CARD TO LAB, STOOL: Fecal Occult Bld: POSITIVE — AB

## 2020-06-24 LAB — CBG MONITORING, ED: Glucose-Capillary: 127 mg/dL — ABNORMAL HIGH (ref 70–99)

## 2020-06-24 IMAGING — CT CT HEAD W/O CM
4 series · 17 of 47 positions shown, 19 images · non-contrast
Comparison: [DATE]

CLINICAL DATA: Nonspecific dizziness.  Fall.

EXAM:
CT HEAD WITHOUT CONTRAST
TECHNIQUE: Contiguous axial images were obtained from the base of the skull
through the vertex without intravenous contrast.

[Series 2: head wo · axial · 0.40mm/px · z∈[-420,-305]mm · 7 of 31 slices shown, 9 images]
[im 4/31  brain]
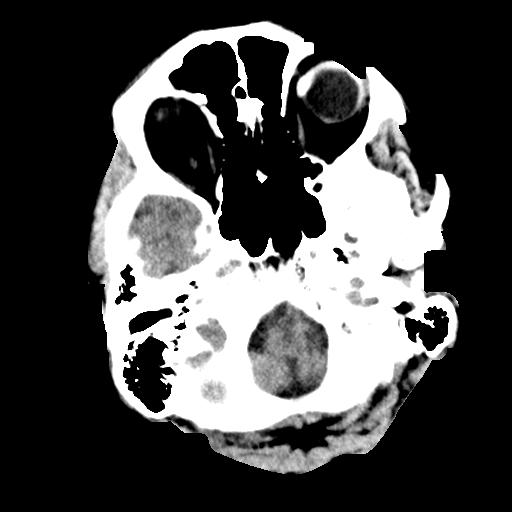
[im 4/31  bone]
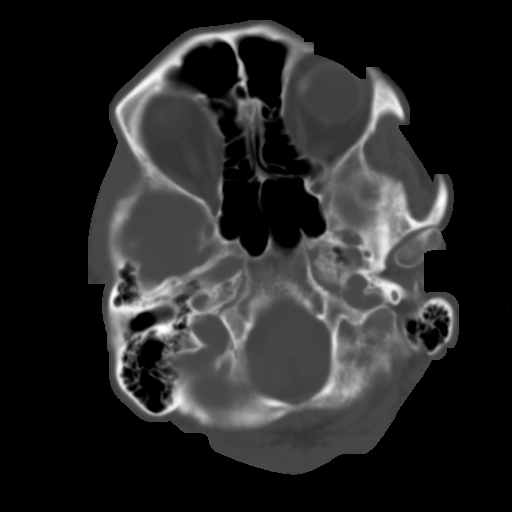
[im 8/31  brain]
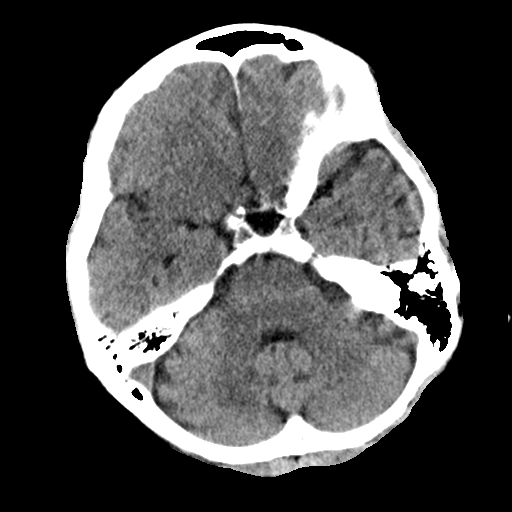
[im 12/31  brain]
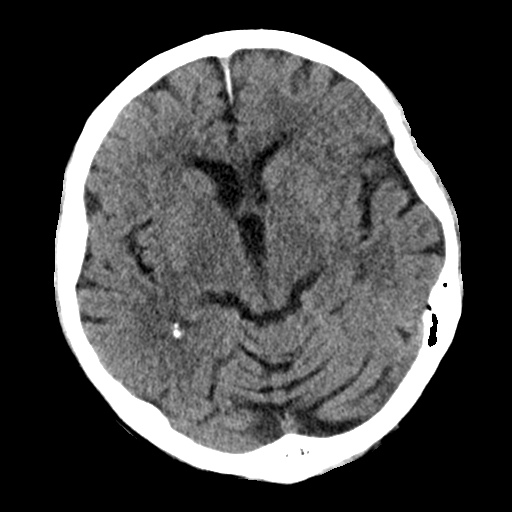
[im 16/31  brain]
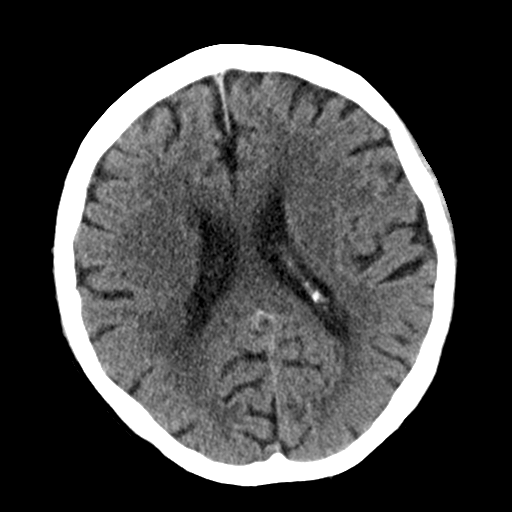
[im 19/31  brain]
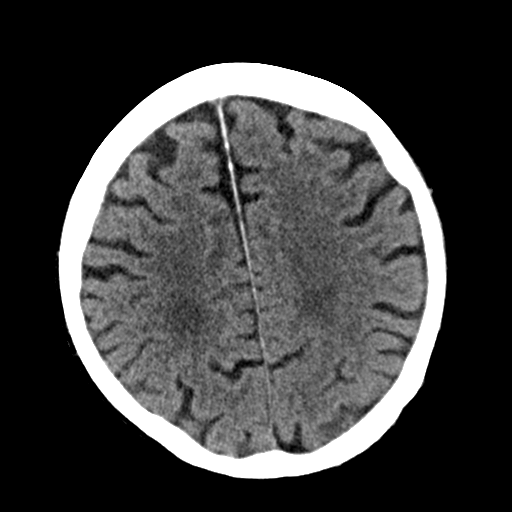
[im 19/31  bone]
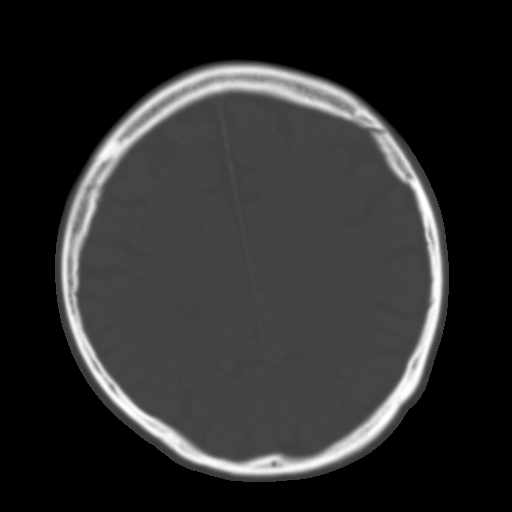
[im 23/31  brain]
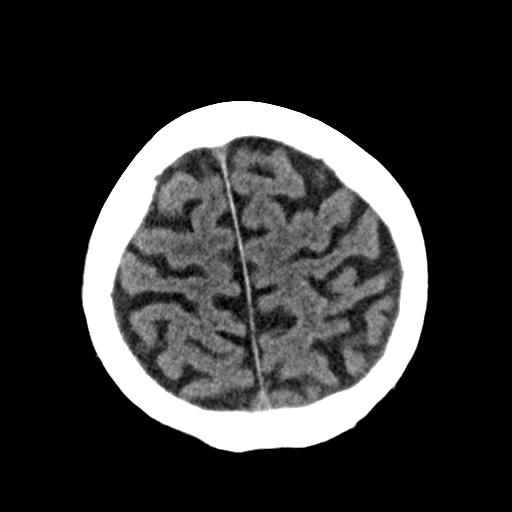
[im 27/31  brain]
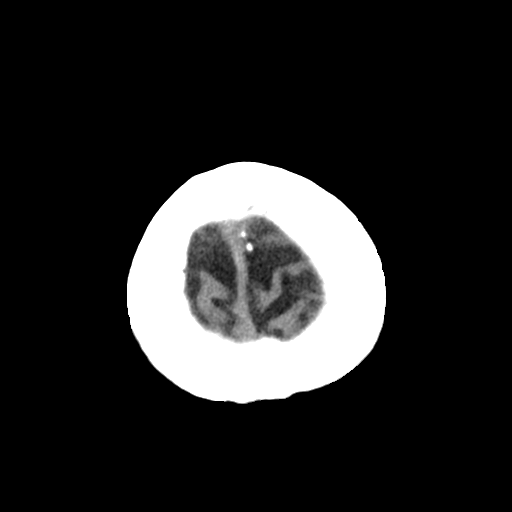

[Series 3: head bone · axial · 0.40mm/px · z∈[-421,-367]mm · 4 of 77 slices shown]
[im 8/77  bone]
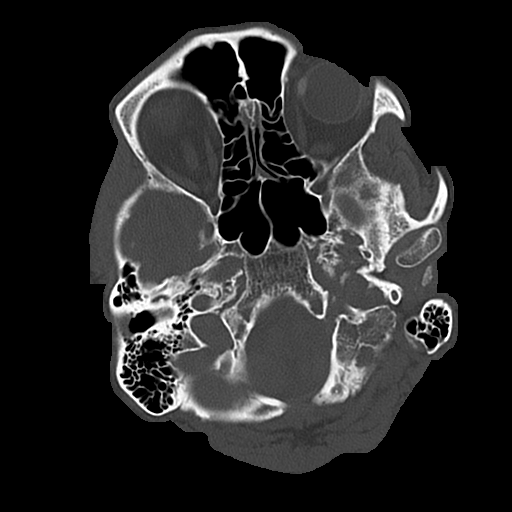
[im 16/77  bone]
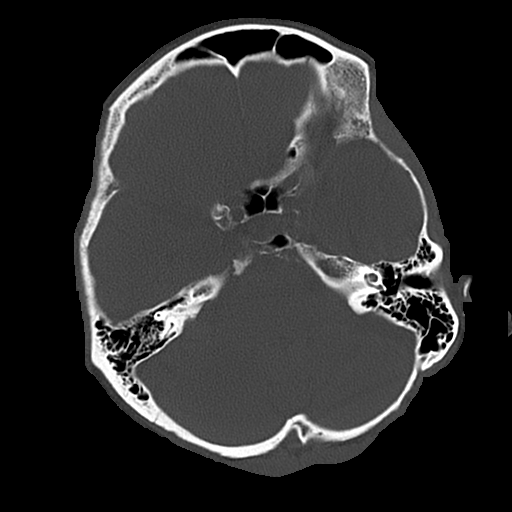
[im 23/77  bone]
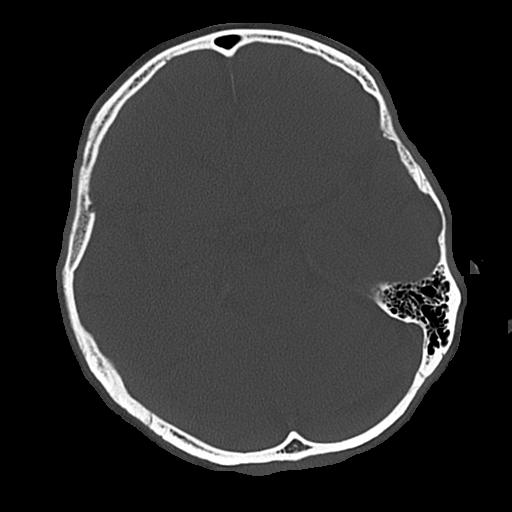
[im 35/77  bone]
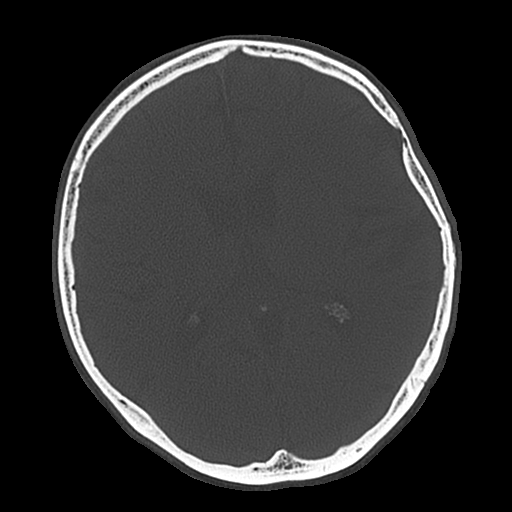

[Series 4: coronal soft · coronal · 0.33mm/px · 3 of 67 slices shown]
[im 23/67  brain]
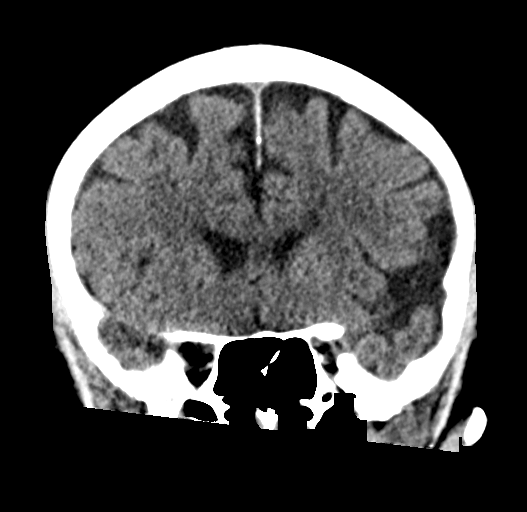
[im 30/67  brain]
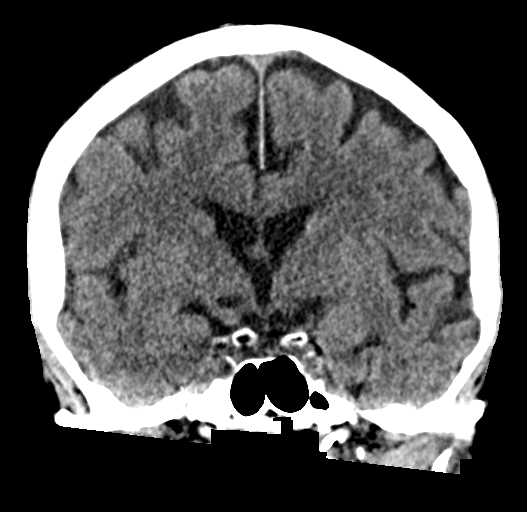
[im 37/67  brain]
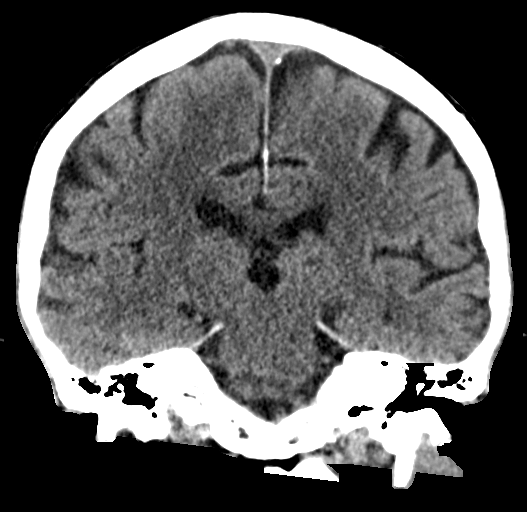

[Series 5: sagittal soft · sagittal · 0.33mm/px · 3 of 58 slices shown]
[im 20/58  brain]
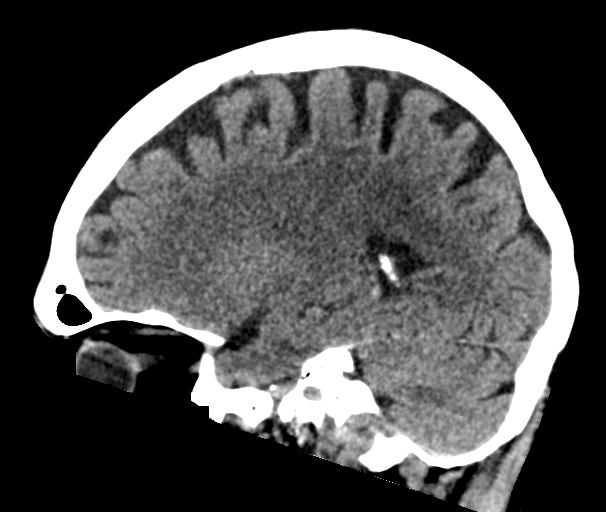
[im 29/58  brain]
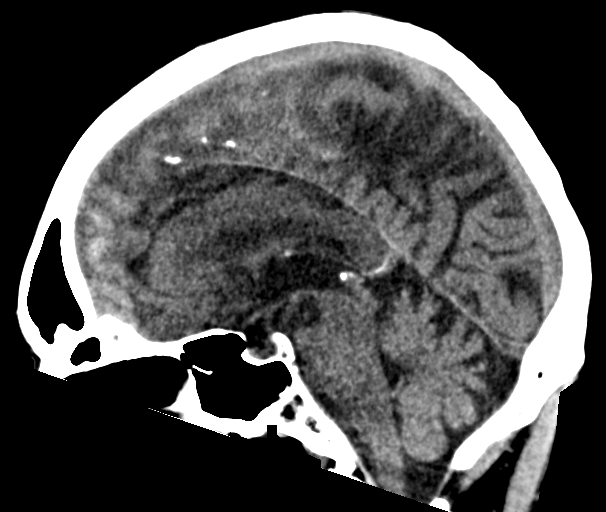
[im 38/58  brain]
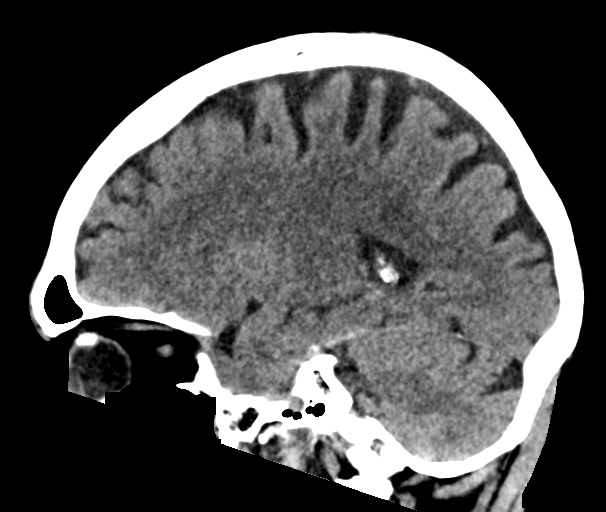

[17 of 47 positions shown; findings below may reference images not displayed]

FINDINGS: Brain: No subdural, epidural, or subarachnoid hemorrhage.
Cerebellum, brainstem, and basal cisterns are noted. No mass effect
or midline shift. Ventricles and sulci are unremarkable. Mild white
matter changes remain. No acute cortical ischemia or infarct
identified.

Vascular: No hyperdense vessel or unexpected calcification.

Skull: Normal. Negative for fracture or focal lesion.

Sinuses/Orbits: No acute finding.

Other: Postoperative changes to the left globe. Extracranial soft
tissues otherwise normal.
IMPRESSION: No acute intracranial abnormalities are identified.

## 2020-06-24 IMAGING — MR MR MRA HEAD W/O CM
1 series · 23 of 48 positions shown · IV contrast (gadavist)
Comparison: Head CT [DATE]

CLINICAL DATA: Dizziness.

EXAM:
MR HEAD WITHOUT CONTRAST
MR CIRCLE OF WILLIS WITHOUT CONTRAST
MRA OF THE NECK WITHOUT AND WITH CONTRAST
TECHNIQUE: Multiplanar, multiecho pulse sequences of the brain, circle of
willis and surrounding structures were obtained without intravenous
contrast. Angiographic images of the neck were obtained using MRA
technique without and with intravenous contrast.
CONTRAST:  7mL GADAVIST GADOBUTROL 1 MMOL/ML IV SOLN

[Series 1: 3d cow · axial · 0.5mm · 0.41mm/px · z∈[-40,+40]mm · 23 of 172 slices shown]
[im 1/172]
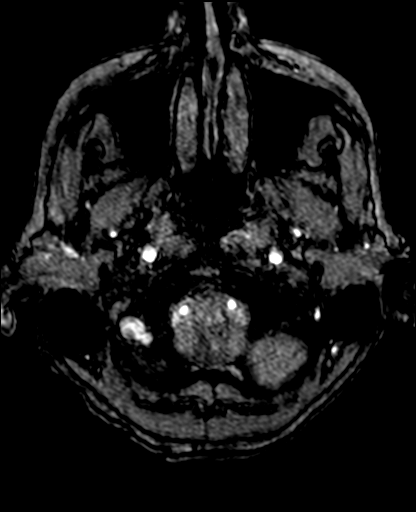
[im 4/172]
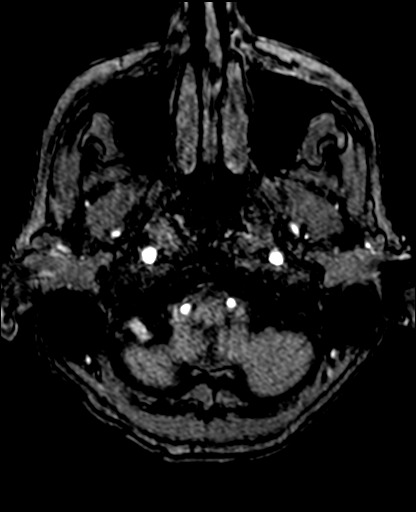
[im 8/172]
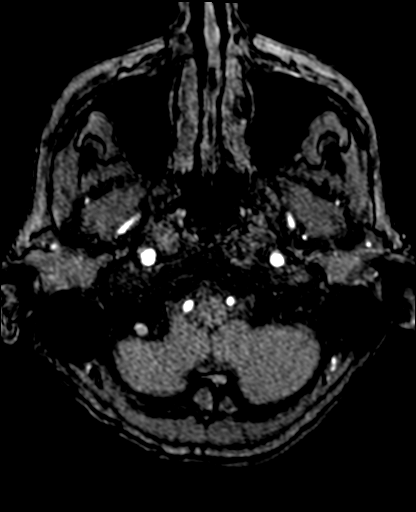
[im 11/172]
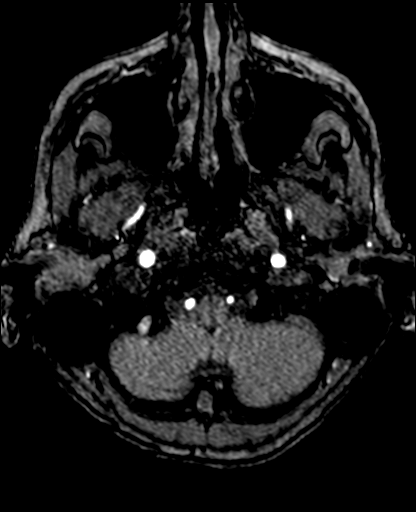
[im 15/172]
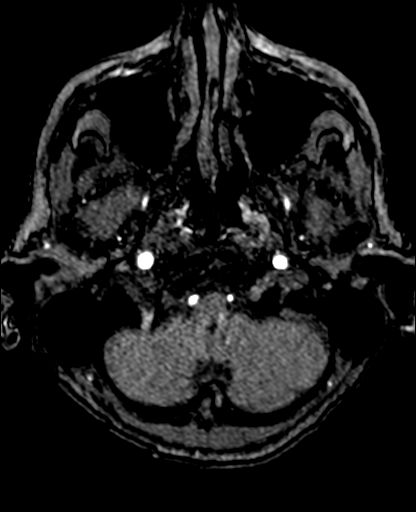
[im 19/172]
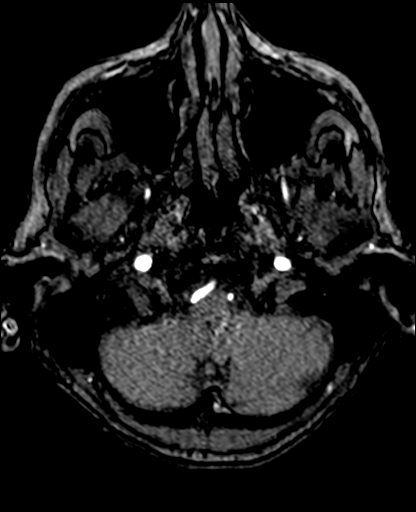
[im 22/172]
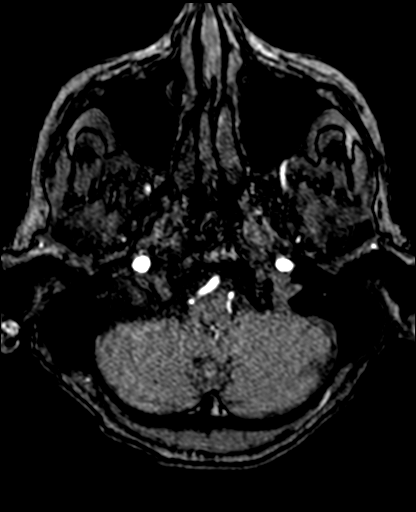
[im 26/172]
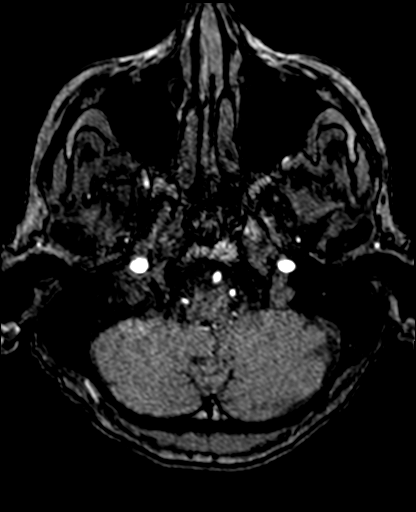
[im 30/172]
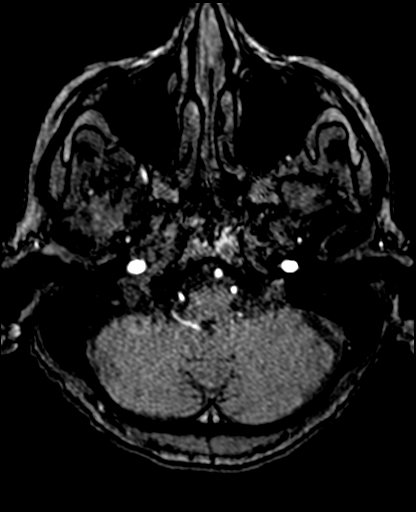
[im 33/172]
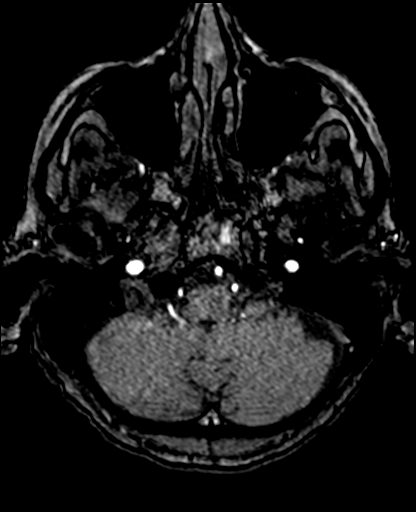
[im 37/172]
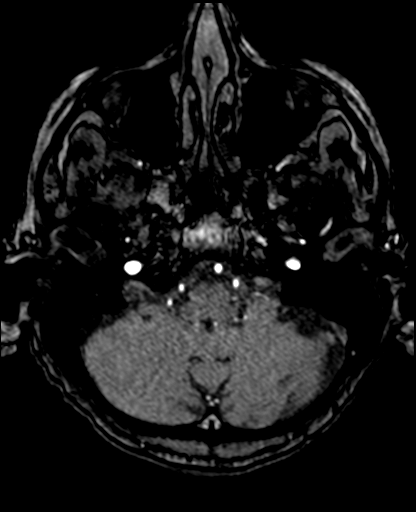
[im 41/172]
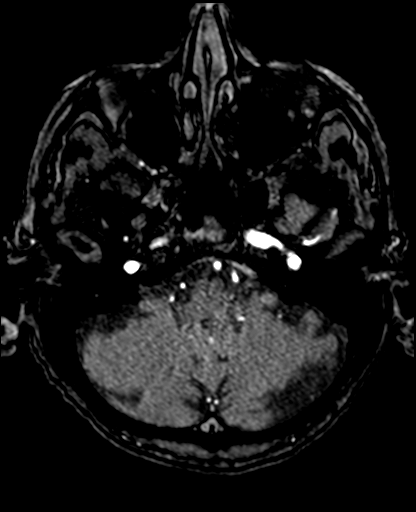
[im 44/172]
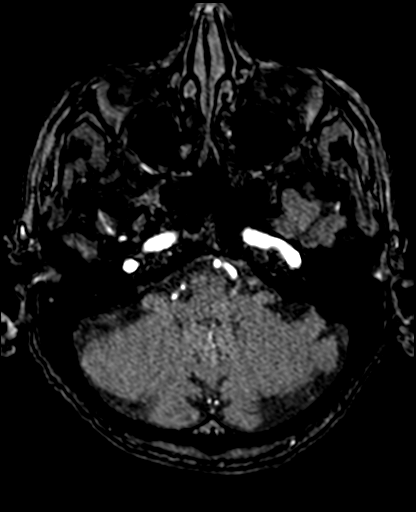
[im 48/172]
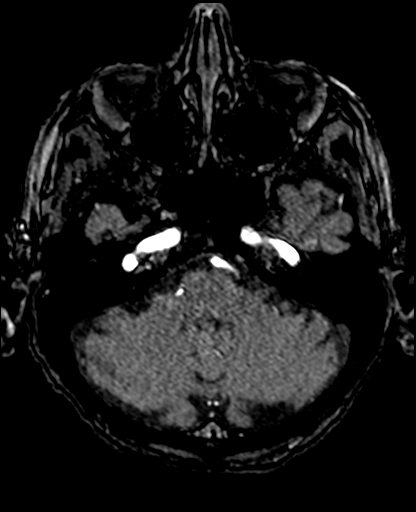
[im 51/172]
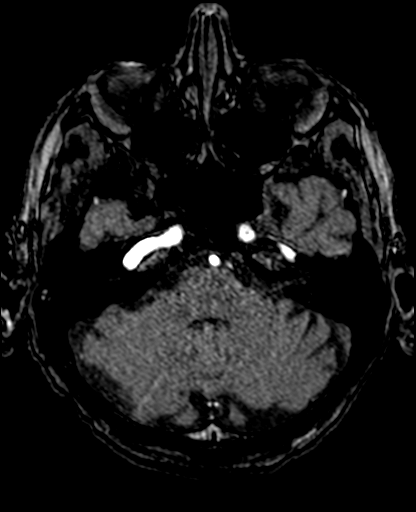
[im 55/172]
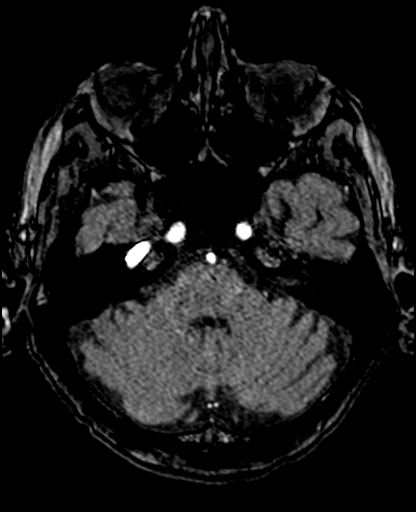
[im 77/172]
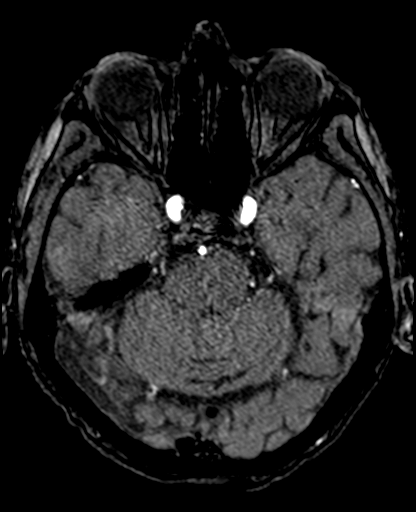
[im 88/172]
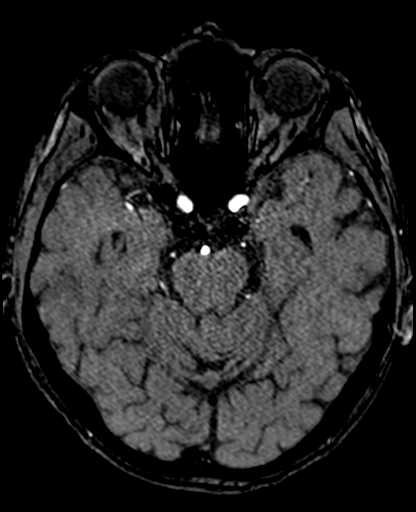
[im 99/172]
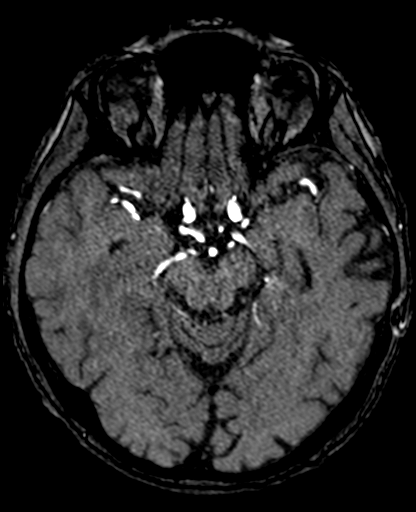
[im 121/172]
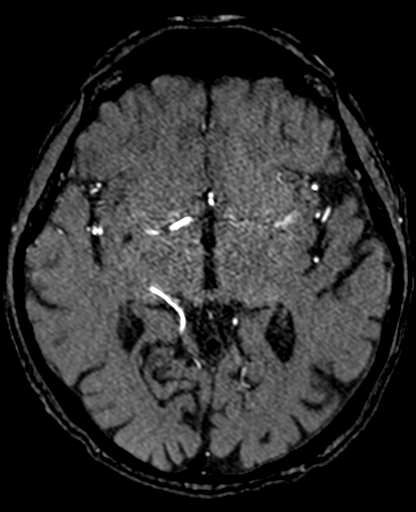
[im 142/172]
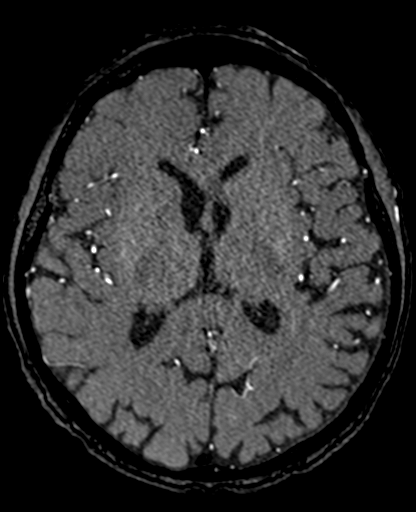
[im 146/172]
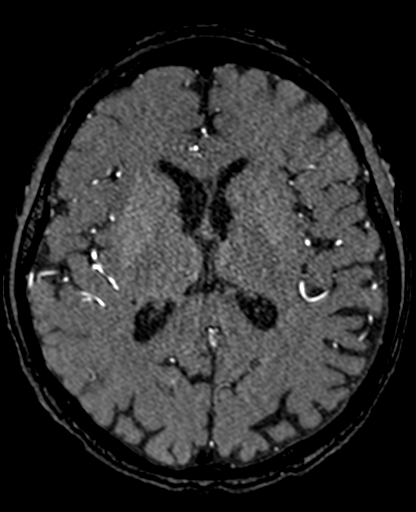
[im 164/172]
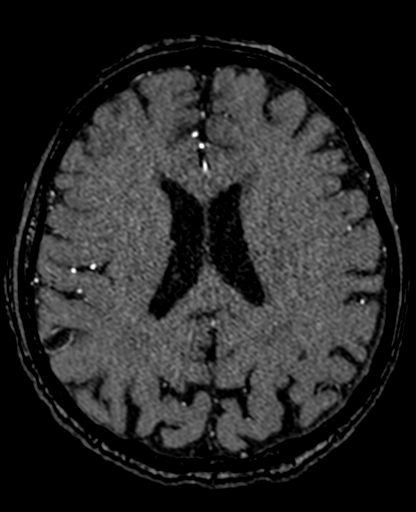

[23 of 48 positions shown; findings below may reference images not displayed]

FINDINGS: MR HEAD FINDINGS

Brain: There is no evidence of an acute infarct, mass, midline
shift, or extra-axial fluid collection. A punctate focus of
susceptibility artifact in the posterolateral aspect of the medulla
on the left may reflect a chronic microhemorrhage. T2
hyperintensities in the cerebral white matter bilaterally are
nonspecific but compatible with mild chronic small vessel ischemic
disease. There is a chronic lacunar infarct in the left thalamus.
Mild cerebral atrophy is within normal limits for age.

Vascular: Major intracranial vascular flow voids are preserved.

Skull and upper cervical spine: Unremarkable bone marrow signal.

Sinuses/Orbits: Bilateral cataract extraction. Left scleral buckle.
Paranasal sinuses and mastoid air cells are clear.

Other: None.

MR CIRCLE OF WILLIS FINDINGS

The included intracranial portions of the vertebral arteries are
widely patent to the basilar. Patent bilateral PICA, left AICA, and
bilateral SCA origins are visualized. A right AICA is not
identified. The basilar artery is widely patent. There are large
posterior communicating arteries bilaterally with hypoplasia of the
right P1 segment. Both PCAs are patent without evidence of
significant proximal stenosis.

The internal carotid arteries are widely patent from skull base to
carotid termini. ACAs and MCAs are patent without evidence of a
proximal branch occlusion or significant proximal stenosis. No
aneurysm is identified.

MRA NECK FINDINGS

Timing is suboptimal on the contrast-enhanced neck MRA. There is a
standard 3 vessel aortic arch. The common carotid and cervical
internal carotid arteries are patent without evidence of a
significant stenosis or dissection. The vertebral arteries are
patent and codominant with antegrade flow bilaterally. No
significant vertebral artery stenosis is identified although the
origin of the left vertebral artery is poorly evaluated due to
motion and contrast timing.
IMPRESSION: 1. No acute intracranial abnormality.
2. Mild chronic small vessel ischemic disease. Chronic left thalamic
lacunar infarct.
3. Negative head MRA.
4. Patent cervical carotid and vertebral arteries without evidence
of a significant stenosis with note made of poor visualization of
the left vertebral artery origin.

## 2020-06-24 IMAGING — MR MR MRA NECK WO/W CM
8 of 9 series · 42 of 48 positions shown · IV contrast (gadavist)
Comparison: Head CT [DATE]

CLINICAL DATA: Dizziness.

EXAM:
MR HEAD WITHOUT CONTRAST
MR CIRCLE OF WILLIS WITHOUT CONTRAST
MRA OF THE NECK WITHOUT AND WITH CONTRAST
TECHNIQUE: Multiplanar, multiecho pulse sequences of the brain, circle of
willis and surrounding structures were obtained without intravenous
contrast. Angiographic images of the neck were obtained using MRA
technique without and with intravenous contrast.
CONTRAST:  7mL GADAVIST GADOBUTROL 1 MMOL/ML IV SOLN

[Series 6: tof_fl3d_tra_iso · axial · 0.6mm · 0.52mm/px · z∈[-176,-98]mm · 7 of 133 slices shown]
[im 1/133]
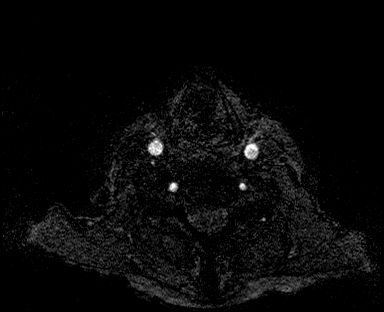
[im 23/133]
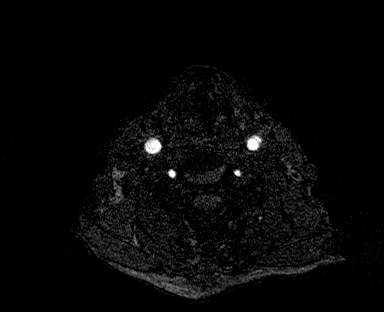
[im 45/133]
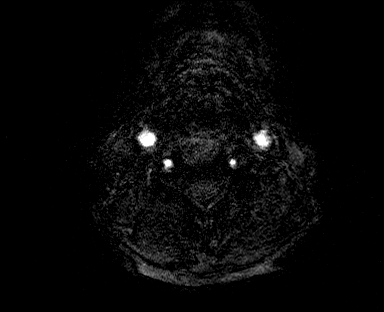
[im 67/133]
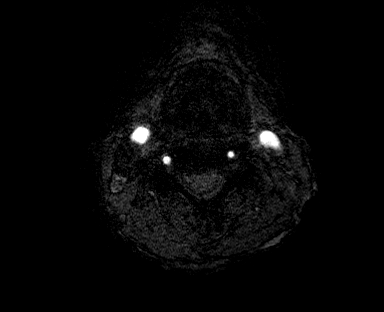
[im 89/133]
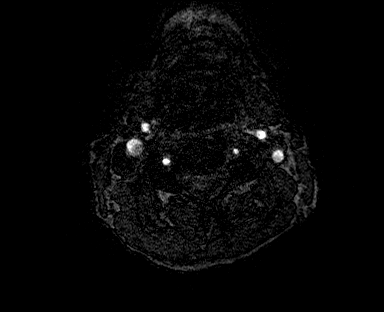
[im 111/133]
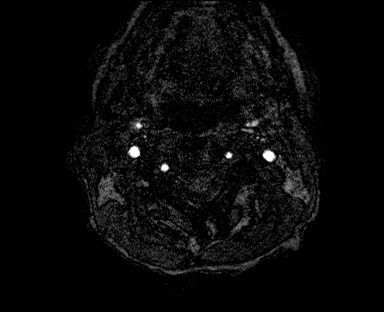
[im 133/133]
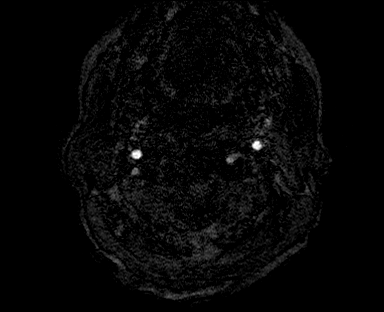

[Series 9: t1_se_fs_tra_blood-suppr. · axial · 2.0mm · 0.47mm/px · 1 of 10 slices shown]
[im 1/10]
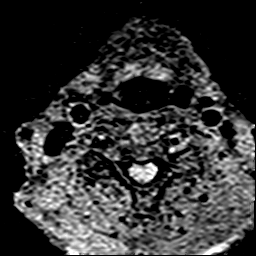

[Series 10: angio_fl3d_cor_pre_ttc=3.0s · coronal · 0.9mm · 0.85mm/px · 5 of 88 slices shown]
[im 1/88]
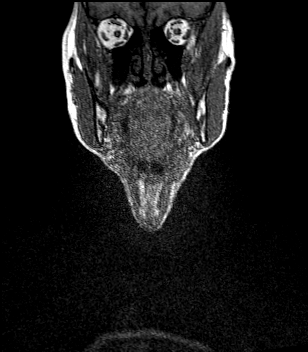
[im 22/88]
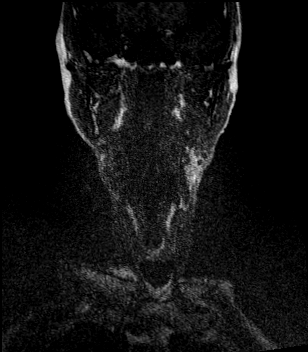
[im 44/88]
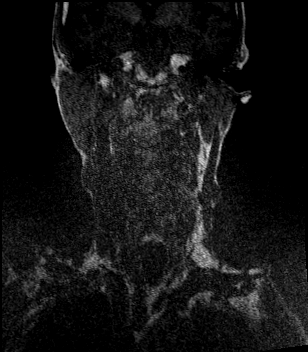
[im 66/88]
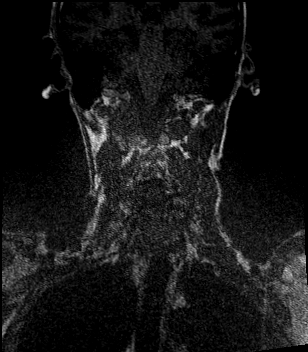
[im 88/88]
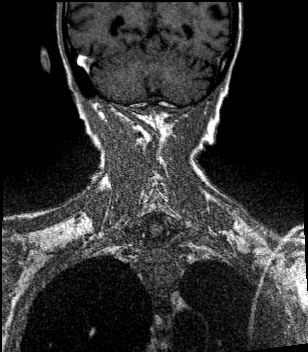

[Series 12: angio_fl3d_cor_post_ttc=3.0s · coronal · 0.9mm · 0.85mm/px · 5 of 88 slices shown (1 of 2)]
[im 1/88]
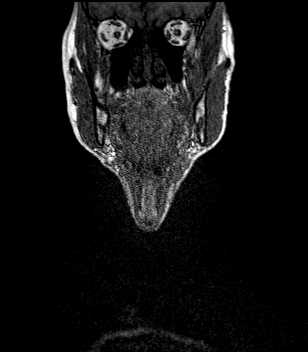
[im 22/88]
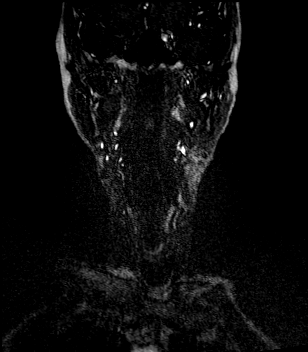
[im 44/88]
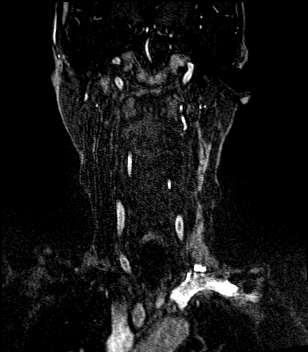
[im 66/88]
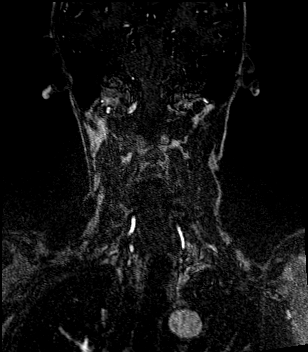
[im 88/88]
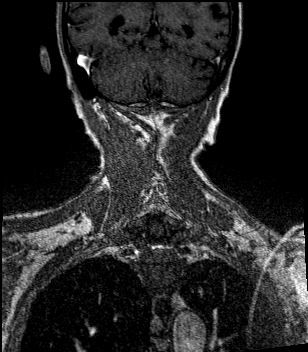

[Series 13: angio_fl3d_cor_post_ttc=3.0s_moco-adv · coronal · 0.9mm · 0.85mm/px · 6 of 88 slices shown (1 of 2)]
[im 1/88]
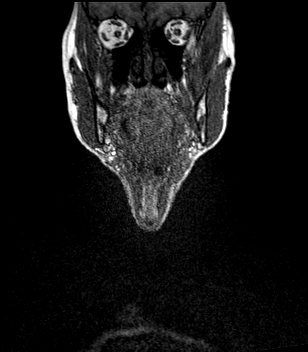
[im 18/88]
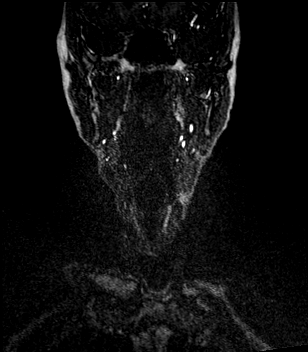
[im 35/88]
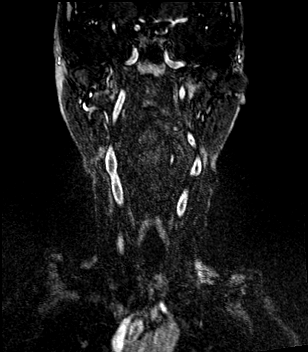
[im 53/88]
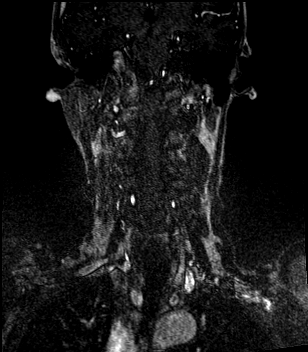
[im 70/88]
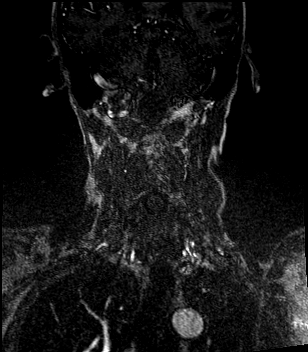
[im 88/88]
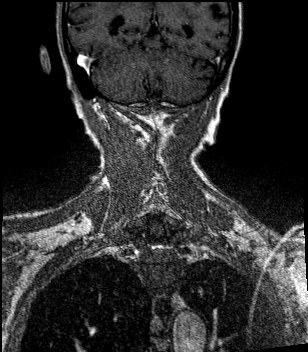

[Series 14: angio_fl3d_cor_post_ttc=3.0s_moco-adv_sub · coronal · 0.9mm · 0.85mm/px · 6 of 88 slices shown]
[im 1/88]
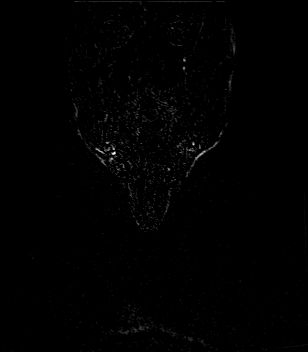
[im 18/88]
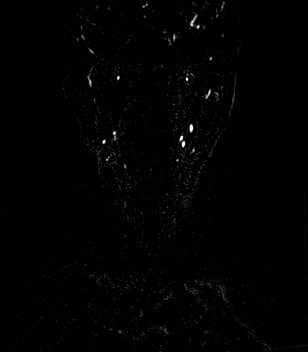
[im 35/88]
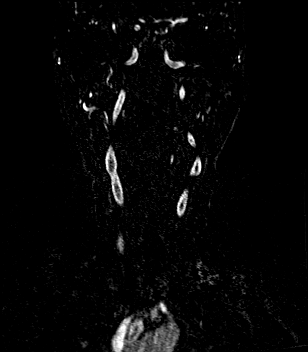
[im 53/88]
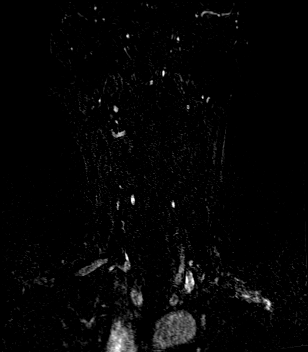
[im 70/88]
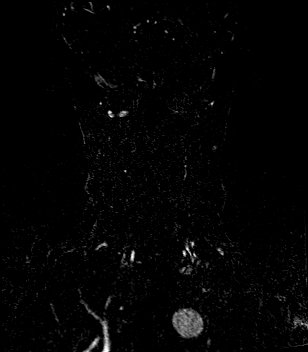
[im 88/88]
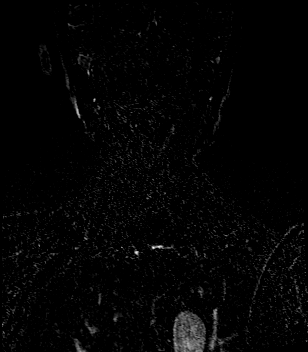

[Series 16: angio_fl3d_cor_post_ttc=3.0s · coronal · 0.9mm · 0.85mm/px · 6 of 88 slices shown (2 of 2)]
[im 1/88]
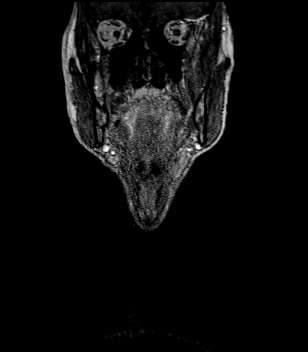
[im 18/88]
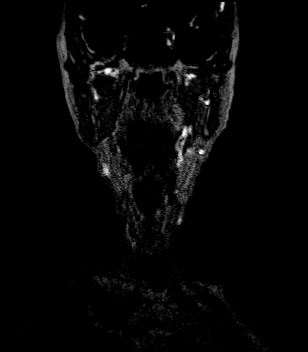
[im 35/88]
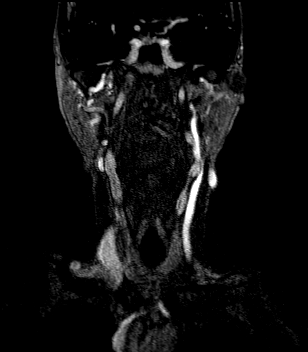
[im 53/88]
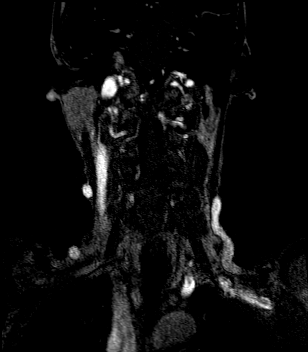
[im 70/88]
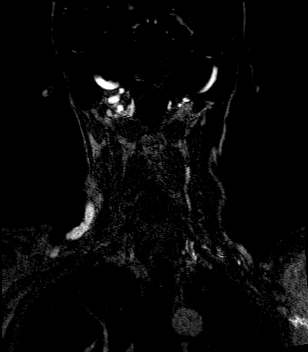
[im 88/88]
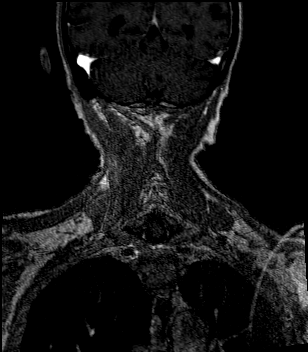

[Series 17: angio_fl3d_cor_post_ttc=3.0s_moco-adv · coronal · 0.9mm · 0.85mm/px · 6 of 88 slices shown (2 of 2)]
[im 1/88]
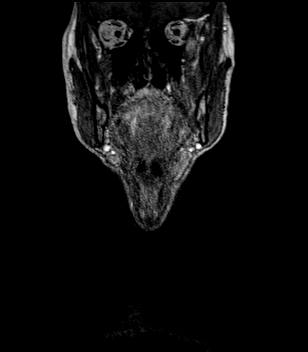
[im 18/88]
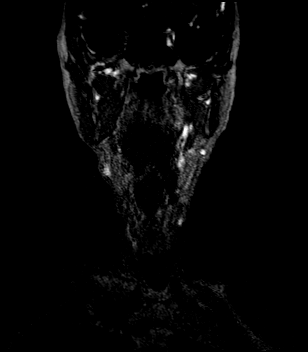
[im 35/88]
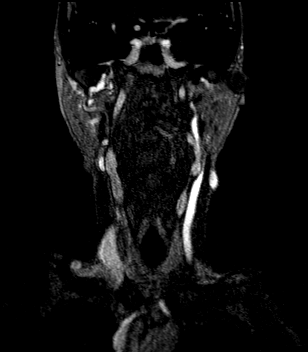
[im 53/88]
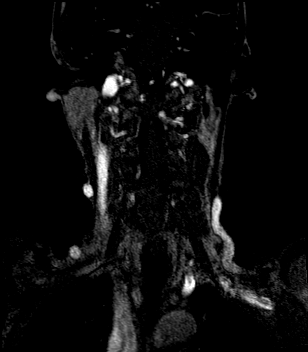
[im 70/88]
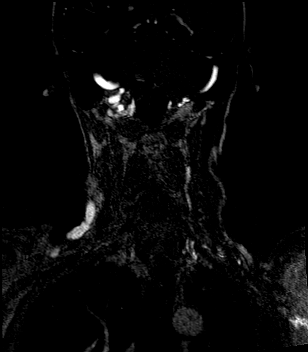
[im 88/88]
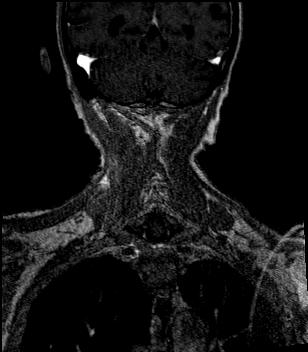

[42 of 48 positions shown; findings below may reference images not displayed]

FINDINGS: MR HEAD FINDINGS

Brain: There is no evidence of an acute infarct, mass, midline
shift, or extra-axial fluid collection. A punctate focus of
susceptibility artifact in the posterolateral aspect of the medulla
on the left may reflect a chronic microhemorrhage. T2
hyperintensities in the cerebral white matter bilaterally are
nonspecific but compatible with mild chronic small vessel ischemic
disease. There is a chronic lacunar infarct in the left thalamus.
Mild cerebral atrophy is within normal limits for age.

Vascular: Major intracranial vascular flow voids are preserved.

Skull and upper cervical spine: Unremarkable bone marrow signal.

Sinuses/Orbits: Bilateral cataract extraction. Left scleral buckle.
Paranasal sinuses and mastoid air cells are clear.

Other: None.

MR CIRCLE OF WILLIS FINDINGS

The included intracranial portions of the vertebral arteries are
widely patent to the basilar. Patent bilateral PICA, left AICA, and
bilateral SCA origins are visualized. A right AICA is not
identified. The basilar artery is widely patent. There are large
posterior communicating arteries bilaterally with hypoplasia of the
right P1 segment. Both PCAs are patent without evidence of
significant proximal stenosis.

The internal carotid arteries are widely patent from skull base to
carotid termini. ACAs and MCAs are patent without evidence of a
proximal branch occlusion or significant proximal stenosis. No
aneurysm is identified.

MRA NECK FINDINGS

Timing is suboptimal on the contrast-enhanced neck MRA. There is a
standard 3 vessel aortic arch. The common carotid and cervical
internal carotid arteries are patent without evidence of a
significant stenosis or dissection. The vertebral arteries are
patent and codominant with antegrade flow bilaterally. No
significant vertebral artery stenosis is identified although the
origin of the left vertebral artery is poorly evaluated due to
motion and contrast timing.
IMPRESSION: 1. No acute intracranial abnormality.
2. Mild chronic small vessel ischemic disease. Chronic left thalamic
lacunar infarct.
3. Negative head MRA.
4. Patent cervical carotid and vertebral arteries without evidence
of a significant stenosis with note made of poor visualization of
the left vertebral artery origin.

## 2020-06-24 MED ORDER — ESOMEPRAZOLE MAGNESIUM 40 MG PO CPDR: 40.0000 mg | DELAYED_RELEASE_CAPSULE | Freq: Every day | ORAL | 0 refills | Status: DC

## 2020-06-24 MED ORDER — LORAZEPAM 2 MG/ML IJ SOLN
0.5000 mg | Freq: Once | INTRAMUSCULAR | Status: AC
  Administered 2020-06-24: 0.5 mg via INTRAVENOUS
  Filled 2020-06-24 (×2): qty 1

## 2020-06-24 MED ORDER — GADOBUTROL 1 MMOL/ML IV SOLN
7.0000 mL | Freq: Once | INTRAVENOUS | Status: AC | PRN
  Administered 2020-06-24: 7 mL via INTRAVENOUS

## 2020-06-24 MED ORDER — SODIUM CHLORIDE 0.9 % IV SOLN
80.0000 mg | Freq: Once | INTRAVENOUS | Status: AC
  Administered 2020-06-24: 80 mg via INTRAVENOUS
  Filled 2020-06-24: qty 80

## 2020-06-24 NOTE — ED Triage Notes (Signed)
He is in [place] at [employer]. He tells [place] that he has been profoundly dizzy since ~0100 hours today. He also tells [place] that, this morning upon attempting to get up he was so profoundly dizzy that he lost his balance (no l.o.c.) and fell. He has a skin tear of right forearm. He is alert and oriented x 4 with clear speech. His daughter is with him.

## 2020-06-24 NOTE — ED Provider Notes (Signed)
[facility] EMERGENCY DEPT Provider Note   CSN: [id number] Arrival date & time: 06/24/20  1147     History Chief Complaint  Patient presents with  . Dizziness  . Fall    Alec Weiss is a 87 y.o. male.  Patient is a 87 year old male with minimal medical problems other than aortic stenosis who is presenting today with complaint of dizziness.  Patient woke up around 1 AM this morning when he went to go to the bathroom and the room was spinning.  He was able to get back to bed he reports even at that time he could walk to the kitchen and he was okay.  However when he got up this morning the dizziness was still present and he was having difficulty walking and fell causing injury to the right side of his arm.  He lives in independent living and called his daughter who had them go check him and he was on the floor.  She stayed with him for several hours patient reports the dizziness had improved some.  She left and they called her again he was in the rehab center and had a second fall.  Patient reports that he is so dizzy it is just difficult for him to walk.  Any type of movement standing or attempting to walk makes him feel more dizzy.  He has had some nausea and vomiting with this.  He denies any palpitations, chest pain, abdominal pain, shortness of breath.  About 3 years ago he reports having a similar episode that did resolve.  He has not recently had new congestion.  He denies hitting his head and he is not on any anticoagulation.  Daughter denies any confusion and patient is able to answer all questions.  The history is provided by the patient.  Dizziness Quality:  Room spinning and imbalance Severity:  Severe Onset quality:  Sudden Duration: about 12 hours ago. Timing:  Constant Progression:  Waxing and waning Chronicity:  New Context comment:  Started around 1 AM when he got out of bed to use the bathroom in the middle of the night Relieved by: Improved by lying down  and being still. Exacerbated by: Walking. Ineffective treatments:  None tried Associated symptoms: nausea and vomiting   Associated symptoms: no chest pain, no diarrhea, no headaches, no hearing loss, no palpitations, no shortness of breath, no tinnitus and no vision changes   Risk factors comment:  History of aortic stenosis but otherwise relatively healthy.  Takes vitamins.  No history of stroke.  No tobacco or alcohol use Fall Pertinent negatives include no chest pain, no headaches and no shortness of breath.       Past Medical History:  Diagnosis Date  . Actinic keratosis 04/05/2012  . Insomnia, unspecified 06/08/2008  . Lumbago 01/02/2003  . 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 Pain in limb 02/05/2011  . Retinal detachment 2000   left eye  . Screening cholesterol level   . Shortness of breath 09/07/2008  . Vitamin D deficiency 08/04/2012    Patient Active Problem List   Diagnosis Date Noted  . Mild aortic stenosis by prior echocardiogram 12/21/2019  . Varicose veins of leg with edema, bilateral 12/21/2019  . Ventral hernia without obstruction or gangrene 12/21/2019  . Tinea corporis 12/21/2019  . Right hip crepitus 08/04/2019  . Loose stools 08/04/2019  . Anemia 12/09/2017  . 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 Detached retina 11/28/2015  . Aortic systolic murmur on examination 11/28/2015  .  Insomnia 03/24/2013  . Pain in joint, shoulder region 09/13/2012  . Vitamin D deficiency 08/04/2012  . Actinic keratosis 04/05/2012    Past Surgical History:  Procedure Laterality Date  . EXTERNAL EAR SURGERY  05/2012   left ear   . INGUINAL HERNIA REPAIR Right 01/21/2005   Dr. [name]  . LID LESION EXCISION  2009  . MOHS SURGERY Left 05/28/2012   ear lobe Dr. [name]  . RETINAL DETACHMENT SURGERY Left [facility]       Family History  Problem Relation Age of Onset  . Cancer Mother        breast    Social History   Tobacco Use  . Smoking status:  Never Smoker  . Smokeless tobacco: Never Used  Substance Use Topics  . Alcohol use: Yes    Alcohol/week: 1.0 standard drink    Types: 1 Glasses of wine per week    Comment: at dinner every night.  . Drug use: No    Home Medications Prior to Admission medications   Medication Sig Start Date End Date Taking? Authorizing Provider  clotrimazole (LOTRIMIN) 1 % cream Apply 1 application topically 2 (two) times daily. To left knee for two dry scaly areas until resolved 05/28/20   Royal Hawthorn, NP  iron polysaccharides (NU-IRON) 150 MG capsule Take 1 capsule (150 mg total) by mouth daily. 05/28/20   Royal Hawthorn, NP  vitamin B-12 (CYANOCOBALAMIN) 1000 MCG tablet Take 1 tablet (1,000 mcg total) by mouth daily. 01/16/20   Reed, Tiffany L, DO  Vitamin D, Ergocalciferol, (DRISDOL) 1.25 MG (50000 UNIT) CAPS capsule TAKE ONE CAPSULE ONCE A WEEK FOR VITAMIN D SUPPLEMENT. 05/28/20   Royal Hawthorn, NP    Allergies    Benzalkonium chloride, Maxitrol [neomycin-polymyxin-dexameth], and Neosporin [neomycin-bacitracin zn-polymyx]  Review of Systems   Review of Systems  HENT: Negative for hearing loss and tinnitus.   Respiratory: Negative for shortness of breath.   Cardiovascular: Negative for chest pain and palpitations.  Gastrointestinal: Positive for nausea and vomiting. Negative for diarrhea.  Neurological: Positive for dizziness. Negative for headaches.  All other systems reviewed and are negative.   Physical Exam Updated Vital Signs BP (!) 144/74 (BP Location: Left Arm)   Pulse 61   Temp 98.1 F (36.7 C) (Oral)   Resp 20   SpO2 100%   Physical Exam Vitals and nursing note reviewed.  Constitutional:      General: He is not in acute distress.    Appearance: He is well-developed.  HENT:     Head: Normocephalic and atraumatic.  Eyes:     General: No visual field deficit.    Extraocular Movements: Extraocular movements intact.     Conjunctiva/sclera: Conjunctivae normal.      Pupils: Pupils are equal, round, and reactive to light.  Cardiovascular:     Rate and Rhythm: Normal rate and regular rhythm.     Heart sounds: No murmur heard.   Pulmonary:     Effort: Pulmonary effort is normal. No respiratory distress.     Breath sounds: Normal breath sounds. No wheezing or rales.  Abdominal:     General: There is no distension.     Palpations: Abdomen is soft.     Tenderness: There is no abdominal tenderness. There is no guarding or rebound.  Musculoskeletal:        General: No tenderness. Normal range of motion.     Cervical back: Normal range of motion and neck supple.  Right lower leg: No edema.     Left lower leg: No edema.  Skin:    General: Skin is warm and dry.     Coloration: Skin is pale.     Findings: No erythema or rash.  Neurological:     Mental Status: He is alert and oriented to person, place, and time.     Cranial Nerves: No dysarthria or facial asymmetry.     Sensory: Sensation is intact.     Motor: No weakness or pronator drift.     Coordination: Heel to Shin Test normal.     Comments: Unable to stand independently and walk.  Shuffling gait to the bed with 2 person assist.  Mostly normal finger-to-nose but slightly shaky at the end  Psychiatric:        Behavior: Behavior normal.     ED Results / Procedures / Treatments   Labs (all labs ordered are listed, but only abnormal results are displayed) Labs Reviewed  CBC WITH DIFFERENTIAL/PLATELET - Abnormal; Notable for the following components:      Result Value   RBC 3.23 (*)    Hemoglobin 8.4 (*)    HCT 26.6 (*)    Lymphs Abs 0.6 (*)    All other components within normal limits  COMPREHENSIVE METABOLIC PANEL - Abnormal; Notable for the following components:   Sodium 129 (*)    Chloride 96 (*)    Glucose, Bld 133 (*)    Creatinine, Ser 0.60 (*)    Calcium 8.8 (*)    Total Protein 6.2 (*)    AST 12 (*)    All other components within normal limits  CBG MONITORING, ED - Abnormal;  Notable for the following components:   Glucose-Capillary 127 (*)    All other components within normal limits  OCCULT BLOOD X 1 CARD TO LAB, STOOL  TROPONIN I (HIGH SENSITIVITY)  TROPONIN I (HIGH SENSITIVITY)    EKG EKG Interpretation  Date/Time:  Sunday June 24 2020 12:05:18 EDT Ventricular Rate:  63 PR Interval:  207 QRS Duration: 110 QT Interval:  422 QTC Calculation: 432 R Axis:   85 Text Interpretation: Sinus rhythm Borderline right axis deviation RSR' in V1 or V2, probably normal variant Nonspecific T abnrm, anterolateral leads new Borderline ST elevation, anterior leads Confirmed by Blanchie Dessert [phone]) on 06/24/2020 12:22:21 PM   Radiology CT Head Wo Contrast  Result Date: 06/24/2020 CLINICAL DATA:  Nonspecific dizziness.  Fall. EXAM: CT HEAD WITHOUT CONTRAST TECHNIQUE: Contiguous axial images were obtained from the base of the skull through the vertex without intravenous contrast. COMPARISON:  May 07, 2020 FINDINGS: Brain: No subdural, epidural, or subarachnoid hemorrhage. Cerebellum, brainstem, and basal cisterns are noted. No mass effect or midline shift. Ventricles and sulci are unremarkable. Mild white matter changes remain. No acute cortical ischemia or infarct identified. Vascular: No hyperdense vessel or unexpected calcification. Skull: Normal. Negative for fracture or focal lesion. Sinuses/Orbits: No acute finding. Other: Postoperative changes to the left globe. Extracranial soft tissues otherwise normal. IMPRESSION: No acute intracranial abnormalities are identified. Electronically Signed   By: Dorise Bullion III M.D   On: 06/24/2020 13:00    Procedures Procedures   Medications Ordered in ED Medications  LORazepam (ATIVAN) injection 0.5 mg (has no administration in time range)    ED Course  I have reviewed the triage vital signs and the nursing notes.  Pertinent labs & imaging results that were available during my care of the patient were reviewed by me  and considered in my medical decision making (see chart for details).    MDM Rules/Calculators/A&P                          Patient is an elderly gentleman presenting today with complaint of dizziness.  He has now had several falls in addition to the dizziness.  The way he describes the dizziness seems to be vertiginous and is even caused some nausea and vomiting.  He does report that he has some stomach problems but denies any abdominal pain.  Neurologic exam he is ataxic at this point having difficulty walking with a shuffled gait and two-person assist.  Walking does make the dizziness worse.  Symptoms have now been present for approximately 12 hours.  He has not a code stroke because there are no LVO symptoms.  Concern for stroke versus peripheral vertigo versus patient is also pale and concern for anemia.  He does have a heart murmur but has a known aortic stenosis.  EKG with minimal change in the ST segment but old was from 2016.  CBC today with a hemoglobin of 8.4 down from 10.17-month ago.  We will Hemoccult patient.  Patient given 0.5 mg of Ativan for symptom control.  Head CT and labs are pending.  2:02 PM CMP without acute findings except for mild hyponatremia of 129, troponin within normal limits, head CT without acute findings.  However on repeat evaluation patient did not want the Ativan however he is unable to walk or stand independently.  Discussed with the patient and his family member.  [facility] send to [facility] to receive MRI to rule out stroke.  Patient may need admission for symptom control even if this is peripheral vertigo.  Will guaiac stool given 2 g hemoglobin drop in the last 1 month.  Patient does report he will need Ativan for MRIs  MDM Number of Diagnoses or Management Options   Amount and/or Complexity of Data Reviewed Clinical lab tests: ordered Tests in the radiology section of CPT: ordered Tests in the medicine section of CPT: ordered Decide to obtain previous  medical records or to obtain history from someone other than the patient: yes Obtain history from someone other than the patient: yes Review and summarize past medical records: yes Discuss the patient with other providers: yes Independent visualization of images, tracings, or specimens: yes  Risk of Complications, Morbidity, and/or Mortality Presenting problems: high Diagnostic procedures: moderate Management options: moderate  Patient Progress Patient progress: stable   Final Clinical Impression(s) / ED Diagnoses Final diagnoses:  Dizziness    Rx / DC Orders ED Discharge Orders    None       Blanchie Dessert, MD 06/24/20 1403

## 2020-06-24 NOTE — ED Notes (Signed)
Patient to MRI.

## 2020-06-24 NOTE — ED Notes (Signed)
Patient denies pain and is resting comfortably.  

## 2020-06-24 NOTE — ED Notes (Signed)
He passed a thick liquid very dark green stool (not melena). I assist him to b.s.c. and back.

## 2020-06-24 NOTE — ED Notes (Signed)
Patient arrives to [facility] from [place]. Patient appears in no acute distress. HX of vertigo recently. Awaiting MRI. Patient alert and oriented x4. VSS.

## 2020-06-24 NOTE — ED Provider Notes (Signed)
  Physical Exam  BP 133/69   Pulse 66   Temp 97.6 F (36.4 C) (Oral)   Resp 16   Ht 5\' 11"  (1.803 m)   Wt 70.3 kg   SpO2 100%   BMI 21.62 kg/m   Physical Exam  ED Course/Procedures     Procedures  MDM  Patient is transferred from [place] for dizziness.  MRI and MRA was ordered.  Of note, patient does have decreasing hemoglobin from 10-8.  However he has no black stools.  He states that he sometimes has some loose stools.  Denies any abdominal pain.  Patient is not on blood thinner  7:18 PM MRA did not show any stroke or dissection.  Patient is able to ambulate by himself.  Patient is here with his daughter.  His daughter states that he is from [facility] and they are able to get him into a rehab bed.  I felt at this point, patient stable for discharge.  In terms of his anemia, he is guaiac positive at the other facility.  He states that he has no black stools but he sometimes has some diarrhea.  Since he is not on blood thinner and hemodynamically stable, I think it is reasonable to follow-up with GI outpatient.  He states that he has never had a colonoscopy.  We will start him on PPI as well.     Drenda Freeze, MD 06/24/20 [phone]

## 2020-06-24 NOTE — ED Notes (Signed)
Pt transported to [facility] ED for MRI, transported via [place]. pts belongings given to daughter. Left ac iv left in place.

## 2020-06-24 NOTE — Discharge Instructions (Addendum)
Your blood count is slightly lower.  Take Nexium daily to help protect your stomach. You will need to see a GI doctor for follow-up and recheck blood count in a week with your doctor  Your MRI did not show a stroke right now.  If you have persistent dizziness, you can follow-up with neurology  See your primary care doctor for follow-up as well  Return to ER if you have worse dizziness, headaches, vomiting, weakness, numbness

## 2020-06-25 ENCOUNTER — Encounter: Payer: Self-pay | Admitting: Internal Medicine

## 2020-06-25 ENCOUNTER — Non-Acute Institutional Stay (SKILLED_NURSING_FACILITY): Payer: Medicare PPO | Admitting: Internal Medicine

## 2020-06-25 DIAGNOSIS — D509 Iron deficiency anemia, unspecified: Secondary | ICD-10-CM

## 2020-06-25 DIAGNOSIS — I350 Nonrheumatic aortic (valve) stenosis: Secondary | ICD-10-CM

## 2020-06-25 DIAGNOSIS — R42 Dizziness and giddiness: Secondary | ICD-10-CM

## 2020-06-25 DIAGNOSIS — E538 Deficiency of other specified B group vitamins: Secondary | ICD-10-CM | POA: Diagnosis not present

## 2020-06-25 DIAGNOSIS — E871 Hypo-osmolality and hyponatremia: Secondary | ICD-10-CM | POA: Diagnosis not present

## 2020-06-25 NOTE — Progress Notes (Signed)
Provider:  Veleta Miners MD Location:    [facility] Room Number: 153A Place of Service:  SNF ([phone])  PCP: [name], MD Patient Care Team: [name], MD as PCP - General (Internal Medicine) [facility] [name], MD as Consulting Physician (Ophthalmology)  Extended Emergency Contact Information Primary Emergency Contact: [place] of [place] Phone: [phone] Relation: Daughter  Code Status: DNR Goals of Care: Advanced Directive information Advanced Directives 06/25/2020  Does Patient Have a Medical Advance Directive? Yes  Type of Advance Directive Out of facility DNR (pink MOST or yellow form);Living will  Does patient want to make changes to medical advance directive? No - Patient declined  Copy of [place] in Chart? -  Would patient like information on creating a medical advance directive? -  Pre-existing out of facility DNR order (yellow form or pink MOST form) Pink MOST form placed in chart (order not valid for inpatient use);Yellow form placed in chart (order not valid for inpatient use)      Chief Complaint  Patient presents with  . New Admit To SNF    Admission to SNF    HPI: Patient is a 87 y.o. male seen today for admission to SNF for therapy  Patient had episode of dizziness.  Per patient the dizziness started on Saturday night.  When he got up to go to the bathroom and he felt the room spinning.  He was able to get back to his bed but then he had a fall when he went the second time.  He was able to call for help was transferred to rehab.  But his dizziness was so bad that he was unable to walk.  He was sent to ED.  He had an MRI and MRA done which did not show any acute changes on stroke. He is now back in rehab.  He states he still feels dizzy but is able to walk with his walker. He also sustained abrasions all through his right arm and   knee. He did not hit his head when he fell.  Did not lose consciousness.  Is not having any nausea anymore. He states that he had an episode like this a year or 2 years ago and received therapy.  Patient also has had anemia due to combination of low iron and B12. ED his hemoglobin went from 10.2 to 8.4   He was guaiac positive.  Referral has been made for GI to follow him.  He also has been having questionable diarrhea/loose stools Dr. Mariea Clonts did do a CT scan of his abdomen which was negative for any acute changes.  At baseline patient is very independent.  Still drives.  Does not use any walker or cane.  Has family in [place]  Past Medical History:  Diagnosis Date  . Actinic keratosis 04/05/2012  . Insomnia, unspecified 06/08/2008  . Lumbago 01/02/2003  . 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 Pain in limb 02/05/2011  . Retinal detachment 2000   left eye  . Screening cholesterol level   . Shortness of breath 09/07/2008  . Vitamin D deficiency 08/04/2012   Past Surgical History:  Procedure Laterality Date  . EXTERNAL EAR SURGERY  05/2012   left ear   . INGUINAL HERNIA REPAIR Right 01/21/2005   Dr. [name]  . LID LESION EXCISION  2009  . MOHS SURGERY Left 05/28/2012   ear lobe Dr. [name]  . RETINAL  [facility]    reports that he has never smoked. He has never used smokeless tobacco. He reports current alcohol use of about 1.0 standard drink of alcohol per week. He reports that he does not use drugs. Social History   Socioeconomic History  . Marital status: Widowed    Spouse name: Not on file  . Number of children: Not on file  . Years of education: Not on file  . Highest education level: Not on file  Occupational History  . Occupation: retired Hydrologist professor  Tobacco Use  . Smoking status: Never Smoker  . Smokeless tobacco: Never Used  Substance and Sexual Activity  . Alcohol use: Yes    Alcohol/week: 1.0 standard drink    Types: 1  Glasses of wine per week    Comment: at dinner every night.  . Drug use: No  . Sexual activity: Not on file  Other Topics Concern  . Not on file  Social History Narrative   Patient is  Married [name]) since 1958--widowed a few years ago (updating 2021). Retired professor, Hydrologist. Lives in apartment, Independent Living section at [facility] since 02/2012.       No Smoking history, drinks moderate amount of alcohol    Patient has Advanced planning documents: DNR   Exercise walking - daily ~1 1/2 - 2 miles (~ 1hr daily) as well as [employer] Exercise classes.   Social Determinants of Health   Financial Resource Strain: Not on file  Food Insecurity: Not on file  Transportation Needs: Not on file  Physical Activity: Not on file  Stress: Not on file  Social Connections: Not on file  Intimate Partner Violence: Not on file    Functional Status Survey:    Family History  Problem Relation Age of Onset  . Cancer Mother        breast    Health Maintenance  Topic Date Due  . COVID-19 Vaccine (4 - Booster) 07/16/2020  . INFLUENZA VACCINE  10/01/2020  . TETANUS/TDAP  05/08/2030  . PNA vac Low Risk Adult  Completed  . HPV VACCINES  Aged Out    Allergies  Allergen Reactions  . Benzalkonium Chloride Rash    Very allergic per patient   . Maxitrol [Neomycin-Polymyxin-Dexameth] Itching and Rash  . Neosporin [Neomycin-Bacitracin Zn-Polymyx] Itching and Rash    Allergies as of 06/25/2020      Reactions   Benzalkonium Chloride Rash   Very allergic per patient    Maxitrol [neomycin-polymyxin-dexameth] Itching, Rash   Neosporin [neomycin-bacitracin Zn-polymyx] Itching, Rash      Medication List       Accurate as of June 25, 2020 10:01 AM. If you have any questions, ask your nurse or doctor.        clotrimazole 1 % cream Commonly known as: LOTRIMIN Apply 1 application topically 2 (two) times daily. To left knee for two dry scaly areas until resolved    esomeprazole 40 MG capsule Commonly known as: NEXIUM Take 1 capsule (40 mg total) by mouth daily.   iron polysaccharides 150 MG capsule Commonly known as: Nu-Iron Take 1 capsule (150 mg total) by mouth daily.   vitamin B-12 1000 MCG tablet Commonly known as: CYANOCOBALAMIN Take 1 tablet (1,000 mcg total) by mouth daily.   Vitamin D (Ergocalciferol) 1.25 MG (50000 UNIT) Caps capsule Commonly known as: DRISDOL TAKE ONE CAPSULE ONCE A WEEK FOR VITAMIN D SUPPLEMENT.       Review of Systems  Constitutional: Positive for activity change.  HENT: Negative.   Respiratory: Negative.   Cardiovascular: Negative.   Gastrointestinal: Positive for diarrhea.  Genitourinary: Positive for frequency.  Musculoskeletal: Negative.   Skin: Positive for wound.  Neurological: Positive for dizziness.  Psychiatric/Behavioral: Negative.     Vitals:   06/25/20 0955  Weight: 155 lb (70.3 kg)  Height: 5\' 11"  (1.803 m)   Body mass index is 21.62 kg/m. Physical Exam Vitals reviewed.  Constitutional:      Appearance: Normal appearance.  HENT:     Head: Normocephalic.     Nose: Nose normal.  Eyes:     Pupils: Pupils are equal, round, and reactive to light.  Cardiovascular:     Rate and Rhythm: Normal rate and regular rhythm.     Heart sounds: Murmur heard.    Pulmonary:     Effort: Pulmonary effort is normal.     Breath sounds: Normal breath sounds.  Abdominal:     General: Abdomen is flat. Bowel sounds are normal.     Palpations: Abdomen is soft.  Musculoskeletal:        General: No swelling.     Cervical back: Neck supple.  Skin:    General: Skin is warm.     Comments: Has Abrasion and Skin tear in Right Arm  Neurological:     General: No focal deficit present.     Mental Status: He is alert and oriented to person, place, and time.     Comments: Left Eye Nystagmus And Positional Vertigo positive  Psychiatric:        Mood and Affect: Mood normal.        Thought Content:  Thought content normal.     Labs reviewed: Basic Metabolic Panel: Recent Labs    12/22/19 0000 06/24/20 1217  NA 137 129*  K 4.5 3.8  CL 101 96*  CO2 29* 27  GLUCOSE  --  133*  BUN 16 14  CREATININE 0.8 0.60*  CALCIUM 9.1 8.8*   Liver Function Tests: Recent Labs    12/22/19 0000 06/24/20 1217  AST 18 12*  ALT 10 8  ALKPHOS  --  66  BILITOT  --  0.4  PROT  --  6.2*  ALBUMIN 3.8 3.7   No results for input(s): LIPASE, AMYLASE in the last 8760 hours. No results for input(s): AMMONIA in the last 8760 hours. CBC: Recent Labs    12/22/19 0000 05/17/20 0000 06/24/20 1217  WBC 4.6 4.4 4.8  NEUTROABS  --   --  3.7  HGB 10.7* 10.2* 8.4*  HCT 33* 30* 26.6*  MCV  --   --  82.4  PLT 326 360 321   Cardiac Enzymes: No results for input(s): CKTOTAL, CKMB, CKMBINDEX, TROPONINI in the last 8760 hours. BNP: Invalid input(s): POCBNP Lab Results  Component Value Date   HGBA1C 5.9 03/09/2012   No results found for: TSH Lab Results  Component Value Date   VITAMINB12 522 05/17/2020   No results found for: FOLATE Lab Results  Component Value Date   IRON 26 05/17/2020   FERRITIN 9.31 01/05/2020    Imaging and Procedures obtained prior to SNF admission: CT Head Wo Contrast  Result Date: 06/24/2020 CLINICAL DATA:  Nonspecific dizziness.  Fall. EXAM: CT HEAD WITHOUT CONTRAST TECHNIQUE: Contiguous axial images were obtained from the base of the skull through the vertex without intravenous contrast. COMPARISON:  May 07, 2020 FINDINGS: Brain: No subdural, epidural, or subarachnoid hemorrhage. Cerebellum, brainstem, and basal cisterns  are noted. No mass effect or midline shift. Ventricles and sulci are unremarkable. Mild white matter changes remain. No acute cortical ischemia or infarct identified. Vascular: No hyperdense vessel or unexpected calcification. Skull: Normal. Negative for fracture or focal lesion. Sinuses/Orbits: No acute finding. Other: Postoperative changes to the  left globe. Extracranial soft tissues otherwise normal. IMPRESSION: No acute intracranial abnormalities are identified. Electronically Signed   By: Dorise Bullion III M.D   On: 06/24/2020 13:00   MR ANGIO HEAD WO CONTRAST  Result Date: 06/24/2020 CLINICAL DATA:  Dizziness. EXAM: MR HEAD WITHOUT CONTRAST MR CIRCLE OF WILLIS WITHOUT CONTRAST MRA OF THE NECK WITHOUT AND WITH CONTRAST TECHNIQUE: Multiplanar, multiecho pulse sequences of the brain, circle of willis and surrounding structures were obtained without intravenous contrast. Angiographic images of the neck were obtained using MRA technique without and with intravenous contrast. CONTRAST:  39mL GADAVIST GADOBUTROL 1 MMOL/ML IV SOLN COMPARISON:  Head CT 06/24/2020 FINDINGS: MR HEAD FINDINGS Brain: There is no evidence of an acute infarct, mass, midline shift, or extra-axial fluid collection. A punctate focus of susceptibility artifact in the posterolateral aspect of the medulla on the left may reflect a chronic microhemorrhage. T2 hyperintensities in the cerebral white matter bilaterally are nonspecific but compatible with mild chronic small vessel ischemic disease. There is a chronic lacunar infarct in the left thalamus. Mild cerebral atrophy is within normal limits for age. Vascular: Major intracranial vascular flow voids are preserved. Skull and upper cervical spine: Unremarkable bone marrow signal. Sinuses/Orbits: Bilateral cataract extraction. Left scleral buckle. Paranasal sinuses and mastoid air cells are clear. Other: None. MR CIRCLE OF WILLIS FINDINGS The included intracranial portions of the vertebral arteries are widely patent to the basilar. Patent bilateral PICA, left AICA, and bilateral SCA origins are visualized. A right AICA is not identified. The basilar artery is widely patent. There are large posterior communicating arteries bilaterally with hypoplasia of the right P1 segment. Both PCAs are patent without evidence of significant proximal  stenosis. The internal carotid arteries are widely patent from skull base to carotid termini. ACAs and MCAs are patent without evidence of a proximal branch occlusion or significant proximal stenosis. No aneurysm is identified. MRA NECK FINDINGS Timing is suboptimal on the contrast-enhanced neck MRA. There is a standard 3 vessel aortic arch. The common carotid and cervical internal carotid arteries are patent without evidence of a significant stenosis or dissection. The vertebral arteries are patent and codominant with antegrade flow bilaterally. No significant vertebral artery stenosis is identified although the origin of the left vertebral artery is poorly evaluated due to motion and contrast timing. IMPRESSION: 1. No acute intracranial abnormality. 2. Mild chronic small vessel ischemic disease. Chronic left thalamic lacunar infarct. 3. Negative head MRA. 4. Patent cervical carotid and vertebral arteries without evidence of a significant stenosis with note made of poor visualization of the left vertebral artery origin. Electronically Signed   By: Logan Bores M.D.   On: 06/24/2020 18:07   MR Angiogram Neck W or Wo Contrast  Result Date: 06/24/2020 CLINICAL DATA:  Dizziness. EXAM: MR HEAD WITHOUT CONTRAST MR CIRCLE OF WILLIS WITHOUT CONTRAST MRA OF THE NECK WITHOUT AND WITH CONTRAST TECHNIQUE: Multiplanar, multiecho pulse sequences of the brain, circle of willis and surrounding structures were obtained without intravenous contrast. Angiographic images of the neck were obtained using MRA technique without and with intravenous contrast. CONTRAST:  35mL GADAVIST GADOBUTROL 1 MMOL/ML IV SOLN COMPARISON:  Head CT 06/24/2020 FINDINGS: MR HEAD FINDINGS Brain: There is no evidence  of an acute infarct, mass, midline shift, or extra-axial fluid collection. A punctate focus of susceptibility artifact in the posterolateral aspect of the medulla on the left may reflect a chronic microhemorrhage. T2 hyperintensities in the  cerebral white matter bilaterally are nonspecific but compatible with mild chronic small vessel ischemic disease. There is a chronic lacunar infarct in the left thalamus. Mild cerebral atrophy is within normal limits for age. Vascular: Major intracranial vascular flow voids are preserved. Skull and upper cervical spine: Unremarkable bone marrow signal. Sinuses/Orbits: Bilateral cataract extraction. Left scleral buckle. Paranasal sinuses and mastoid air cells are clear. Other: None. MR CIRCLE OF WILLIS FINDINGS The included intracranial portions of the vertebral arteries are widely patent to the basilar. Patent bilateral PICA, left AICA, and bilateral SCA origins are visualized. A right AICA is not identified. The basilar artery is widely patent. There are large posterior communicating arteries bilaterally with hypoplasia of the right P1 segment. Both PCAs are patent without evidence of significant proximal stenosis. The internal carotid arteries are widely patent from skull base to carotid termini. ACAs and MCAs are patent without evidence of a proximal branch occlusion or significant proximal stenosis. No aneurysm is identified. MRA NECK FINDINGS Timing is suboptimal on the contrast-enhanced neck MRA. There is a standard 3 vessel aortic arch. The common carotid and cervical internal carotid arteries are patent without evidence of a significant stenosis or dissection. The vertebral arteries are patent and codominant with antegrade flow bilaterally. No significant vertebral artery stenosis is identified although the origin of the left vertebral artery is poorly evaluated due to motion and contrast timing. IMPRESSION: 1. No acute intracranial abnormality. 2. Mild chronic small vessel ischemic disease. Chronic left thalamic lacunar infarct. 3. Negative head MRA. 4. Patent cervical carotid and vertebral arteries without evidence of a significant stenosis with note made of poor visualization of the left vertebral artery  origin. Electronically Signed   By: Logan Bores M.D.   On: 06/24/2020 18:07   MR BRAIN WO CONTRAST  Result Date: 06/24/2020 CLINICAL DATA:  Dizziness. EXAM: MR HEAD WITHOUT CONTRAST MR CIRCLE OF WILLIS WITHOUT CONTRAST MRA OF THE NECK WITHOUT AND WITH CONTRAST TECHNIQUE: Multiplanar, multiecho pulse sequences of the brain, circle of willis and surrounding structures were obtained without intravenous contrast. Angiographic images of the neck were obtained using MRA technique without and with intravenous contrast. CONTRAST:  6mL GADAVIST GADOBUTROL 1 MMOL/ML IV SOLN COMPARISON:  Head CT 06/24/2020 FINDINGS: MR HEAD FINDINGS Brain: There is no evidence of an acute infarct, mass, midline shift, or extra-axial fluid collection. A punctate focus of susceptibility artifact in the posterolateral aspect of the medulla on the left may reflect a chronic microhemorrhage. T2 hyperintensities in the cerebral white matter bilaterally are nonspecific but compatible with mild chronic small vessel ischemic disease. There is a chronic lacunar infarct in the left thalamus. Mild cerebral atrophy is within normal limits for age. Vascular: Major intracranial vascular flow voids are preserved. Skull and upper cervical spine: Unremarkable bone marrow signal. Sinuses/Orbits: Bilateral cataract extraction. Left scleral buckle. Paranasal sinuses and mastoid air cells are clear. Other: None. MR CIRCLE OF WILLIS FINDINGS The included intracranial portions of the vertebral arteries are widely patent to the basilar. Patent bilateral PICA, left AICA, and bilateral SCA origins are visualized. A right AICA is not identified. The basilar artery is widely patent. There are large posterior communicating arteries bilaterally with hypoplasia of the right P1 segment. Both PCAs are patent without evidence of significant proximal stenosis. The internal  carotid arteries are widely patent from skull base to carotid termini. ACAs and MCAs are patent  without evidence of a proximal branch occlusion or significant proximal stenosis. No aneurysm is identified. MRA NECK FINDINGS Timing is suboptimal on the contrast-enhanced neck MRA. There is a standard 3 vessel aortic arch. The common carotid and cervical internal carotid arteries are patent without evidence of a significant stenosis or dissection. The vertebral arteries are patent and codominant with antegrade flow bilaterally. No significant vertebral artery stenosis is identified although the origin of the left vertebral artery is poorly evaluated due to motion and contrast timing. IMPRESSION: 1. No acute intracranial abnormality. 2. Mild chronic small vessel ischemic disease. Chronic left thalamic lacunar infarct. 3. Negative head MRA. 4. Patent cervical carotid and vertebral arteries without evidence of a significant stenosis with note made of poor visualization of the left vertebral artery origin. Electronically Signed   By: Logan Bores M.D.   On: 06/24/2020 18:07    Assessment/Plan Dizziness Most Likely BPPV Will start on Meclizine 12.5 mg BID for 1 week and then PRN Vetibular therapy per Thrapy Follow up with Neurology  Iron deficiency anemia, unspecified iron deficiency anemia type On Iron Was guaiac Positive  GI Follow up Was started on Nexium in ED Also will hold Psyllium for now as he was Constipated in CT scan done  on11/21  Mild aortic stenosis by prior echocardiogram Per Cardiology Note no concerns for now Last Echo was 2018 B12 deficiency On B12 supplement Last Level was 522 Hyponatremia Repeat BMP Bruises and Abrasion Discussed with Wound care nurse Doing Vaseline and Non Adhesive dressing  Family/ staff Communication:   Labs/tests ordered: CBC,CMP in 4 days Therapy for Vestibular therapy

## 2020-06-27 ENCOUNTER — Other Ambulatory Visit: Payer: Self-pay | Admitting: Orthopedic Surgery

## 2020-06-27 DIAGNOSIS — D509 Iron deficiency anemia, unspecified: Secondary | ICD-10-CM

## 2020-06-27 DIAGNOSIS — D513 Other dietary vitamin B12 deficiency anemia: Secondary | ICD-10-CM | POA: Diagnosis not present

## 2020-06-27 DIAGNOSIS — R42 Dizziness and giddiness: Secondary | ICD-10-CM

## 2020-06-27 DIAGNOSIS — Z9181 History of falling: Secondary | ICD-10-CM | POA: Diagnosis not present

## 2020-06-27 DIAGNOSIS — D649 Anemia, unspecified: Secondary | ICD-10-CM

## 2020-06-27 DIAGNOSIS — D519 Vitamin B12 deficiency anemia, unspecified: Secondary | ICD-10-CM | POA: Diagnosis not present

## 2020-06-27 DIAGNOSIS — R278 Other lack of coordination: Secondary | ICD-10-CM | POA: Diagnosis not present

## 2020-06-27 DIAGNOSIS — R2689 Other abnormalities of gait and mobility: Secondary | ICD-10-CM | POA: Diagnosis not present

## 2020-06-27 DIAGNOSIS — H8113 Benign paroxysmal vertigo, bilateral: Secondary | ICD-10-CM | POA: Diagnosis not present

## 2020-06-27 DIAGNOSIS — K5790 Diverticulosis of intestine, part unspecified, without perforation or abscess without bleeding: Secondary | ICD-10-CM

## 2020-06-27 LAB — HEPATIC FUNCTION PANEL
ALT: 7 — AB (ref 10–40)
AST: 11 — AB (ref 14–40)
Alkaline Phosphatase: 87 (ref 25–125)
Bilirubin, Total: 0.2

## 2020-06-27 LAB — BASIC METABOLIC PANEL
BUN: 12 (ref 4–21)
CO2: 28 — AB (ref 13–22)
Chloride: 98 — AB (ref 99–108)
Creatinine: 0.8 (ref 0.6–1.3)
Glucose: 84
Potassium: 4.4 (ref 3.4–5.3)
Sodium: 133 — AB (ref 137–147)

## 2020-06-27 LAB — CBC AND DIFFERENTIAL
HCT: 24 — AB (ref 41–53)
Hemoglobin: 8.1 — AB (ref 13.5–17.5)
Platelets: 347 (ref 150–399)
WBC: 5.4

## 2020-06-27 LAB — COMPREHENSIVE METABOLIC PANEL
Albumin: 3.2 — AB (ref 3.5–5.0)
Calcium: 9 (ref 8.7–10.7)
Globulin: 2.1

## 2020-06-27 LAB — CBC: RBC: 3.07 — AB (ref 3.87–5.11)

## 2020-06-28 DIAGNOSIS — R2689 Other abnormalities of gait and mobility: Secondary | ICD-10-CM | POA: Diagnosis not present

## 2020-06-28 DIAGNOSIS — R278 Other lack of coordination: Secondary | ICD-10-CM | POA: Diagnosis not present

## 2020-06-28 DIAGNOSIS — Z9181 History of falling: Secondary | ICD-10-CM | POA: Diagnosis not present

## 2020-06-28 DIAGNOSIS — H8113 Benign paroxysmal vertigo, bilateral: Secondary | ICD-10-CM | POA: Diagnosis not present

## 2020-06-29 DIAGNOSIS — R2689 Other abnormalities of gait and mobility: Secondary | ICD-10-CM | POA: Diagnosis not present

## 2020-06-29 DIAGNOSIS — H8113 Benign paroxysmal vertigo, bilateral: Secondary | ICD-10-CM | POA: Diagnosis not present

## 2020-06-29 DIAGNOSIS — R278 Other lack of coordination: Secondary | ICD-10-CM | POA: Diagnosis not present

## 2020-06-29 DIAGNOSIS — Z9181 History of falling: Secondary | ICD-10-CM | POA: Diagnosis not present

## 2020-07-03 ENCOUNTER — Telehealth: Payer: Self-pay | Admitting: Gastroenterology

## 2020-07-03 NOTE — Telephone Encounter (Signed)
Hey Dr. Loletha Carrow,   We received a referral from [facility] for anemia and Diverticulosis. Patient have been seen at [facility] by Dr. Lyndel Safe and Dr. Mariea Clonts there, [phone]. Records are in Epic.  Please advise on scheduling.  Thank you

## 2020-07-04 ENCOUNTER — Other Ambulatory Visit: Payer: Self-pay | Admitting: *Deleted

## 2020-07-04 MED ORDER — VITAMIN D (ERGOCALCIFEROL) 1.25 MG (50000 UNIT) PO CAPS: ORAL_CAPSULE | ORAL | 1 refills | Status: DC

## 2020-07-04 MED ORDER — ESOMEPRAZOLE MAGNESIUM 40 MG PO CPDR: 40.0000 mg | DELAYED_RELEASE_CAPSULE | Freq: Every day | ORAL | 3 refills | Status: DC

## 2020-07-04 NOTE — Telephone Encounter (Signed)
Patient called requesting refills

## 2020-07-05 ENCOUNTER — Telehealth: Payer: Self-pay | Admitting: Internal Medicine

## 2020-07-05 LAB — CBC AND DIFFERENTIAL
HCT: 26 — AB (ref 41–53)
Hemoglobin: 8.1 — AB (ref 13.5–17.5)
Neutrophils Absolute: 4.6
Platelets: 400 — AB (ref 150–399)
WBC: 6.5

## 2020-07-05 LAB — BASIC METABOLIC PANEL
BUN: 16 (ref 4–21)
CO2: 27 — AB (ref 13–22)
Chloride: 98 — AB (ref 99–108)
Creatinine: 0.9 (ref 0.6–1.3)
Glucose: 105
Potassium: 4.6 (ref 3.4–5.3)
Sodium: 132 — AB (ref 137–147)

## 2020-07-05 LAB — HEPATIC FUNCTION PANEL
ALT: 9 — AB (ref 10–40)
AST: 14 (ref 14–40)
Alkaline Phosphatase: 68 (ref 25–125)
Bilirubin, Total: 0.4

## 2020-07-05 LAB — CBC: RBC: 3.19 — AB (ref 3.87–5.11)

## 2020-07-05 LAB — COMPREHENSIVE METABOLIC PANEL
Albumin: 3.7 (ref 3.5–5.0)
Calcium: 9 (ref 8.7–10.7)

## 2020-07-05 NOTE — Telephone Encounter (Signed)
Got call from [place]. Patient came to see her today and seemed very Pale, weak and SOB. Continues to have issue with Loose stool. He still does not have follow up Appointment with GI yet. Will get Stat CBC and CMP.

## 2020-07-06 ENCOUNTER — Telehealth: Payer: Self-pay | Admitting: Adult Health

## 2020-07-06 DIAGNOSIS — D509 Iron deficiency anemia, unspecified: Secondary | ICD-10-CM

## 2020-07-06 NOTE — Telephone Encounter (Signed)
Alec Weiss is having some weakness and sob with exertion in the [place] setting. CBC was drawn and his Hgb remains unchanged at 8.1 despite iron therapy.  BMP was unremarkable with NA of 132 which is not a change from 9 days ago. After discussion with Dr. Lyndel Safe we feel he could benefit from iron infusions to help improve his Hgb, prevent ER visits, and improve symptoms. I spoke with the [place] nurse and she is going to check on the pt and let him know about the referral to hematology for iron infusions. We are still waiting on an apt with GI at this time.

## 2020-07-08 NOTE — Telephone Encounter (Signed)
Alec Weiss,    Please put this patient in for an appointment with me on May 17th at 1:20pm (1pm arrival).  Clinic availability limited, so please encourage this patient and his family to attend.  Recent ED note indicates his daughter was with him that day and involved in his care.  _______________________  Ms. Wert and Dr. Lyndel Safe,   This patient had worsening IDA and heme positive stool found on ED visit 06/24/20 when he presented for dizziness and falls. He will be assessed at an upcoming clinic visit and candidacy for endoscopic workup determined.  Please obtain a CBC this week because if declining, he would most likely need hospital admission if GI workup is to be pursued.  Thank you.  - Wilfrid Lund, MD [facility]

## 2020-07-09 ENCOUNTER — Telehealth: Payer: Self-pay | Admitting: Hematology and Oncology

## 2020-07-09 NOTE — Telephone Encounter (Signed)
Received a new hem referral from [facility] for Alec Weiss. Alec Weiss returned my call and has been scheduled to see [name] on 5/11 at 10am. Pt aware to arrive 15 minutes early.

## 2020-07-10 NOTE — Telephone Encounter (Signed)
Patient have given me permission to speak with his daughter, Alec Weiss [phone]. Spoke with her, states she will accompany him at the appointment 5/17 @ 1.   Thank you

## 2020-07-10 NOTE — Telephone Encounter (Signed)
Records indicate his daughter was with him at a recent ED visit.  Please ask patient for permission to contact her and ask if she can accompany him to the clinic visit (if she lives in the area).

## 2020-07-10 NOTE — Telephone Encounter (Signed)
Dr. Loletha Carrow,   I have called the patient and informed him of upcoming appointment. He expressed understanding and looking forward to coming.   Thank you.

## 2020-07-11 ENCOUNTER — Other Ambulatory Visit: Payer: Self-pay

## 2020-07-11 ENCOUNTER — Encounter: Payer: Self-pay | Admitting: Hematology and Oncology

## 2020-07-11 ENCOUNTER — Inpatient Hospital Stay: Payer: Medicare PPO | Attending: Hematology and Oncology | Admitting: Hematology and Oncology

## 2020-07-11 ENCOUNTER — Inpatient Hospital Stay: Payer: Medicare PPO

## 2020-07-11 VITALS — BP 148/74 | HR 85 | Temp 98.3°F | Resp 18 | Ht 71.0 in | Wt 150.3 lb

## 2020-07-11 DIAGNOSIS — D509 Iron deficiency anemia, unspecified: Secondary | ICD-10-CM

## 2020-07-11 DIAGNOSIS — Z8673 Personal history of transient ischemic attack (TIA), and cerebral infarction without residual deficits: Secondary | ICD-10-CM | POA: Insufficient documentation

## 2020-07-11 DIAGNOSIS — Z79899 Other long term (current) drug therapy: Secondary | ICD-10-CM | POA: Insufficient documentation

## 2020-07-11 DIAGNOSIS — D649 Anemia, unspecified: Secondary | ICD-10-CM

## 2020-07-11 LAB — CMP (CANCER CENTER ONLY)
ALT: 8 U/L (ref 0–44)
AST: 17 U/L (ref 15–41)
Albumin: 3.5 g/dL (ref 3.5–5.0)
Alkaline Phosphatase: 86 U/L (ref 38–126)
Anion gap: 8 (ref 5–15)
BUN: 12 mg/dL (ref 8–23)
CO2: 28 mmol/L (ref 22–32)
Calcium: 9.5 mg/dL (ref 8.9–10.3)
Chloride: 101 mmol/L (ref 98–111)
Creatinine: 0.81 mg/dL (ref 0.61–1.24)
GFR, Estimated: >60 mL/min (ref >60)
Glucose, Bld: 91 mg/dL (ref 70–99)
Potassium: 4.2 mmol/L (ref 3.5–5.1)
Sodium: 137 mmol/L (ref 135–145)
Total Bilirubin: 0.4 mg/dL (ref 0.3–1.2)
Total Protein: 7 g/dL (ref 6.5–8.1)

## 2020-07-11 LAB — RETICULOCYTES
Immature Retic Fract: 19.4 % — ABNORMAL HIGH (ref 2.3–15.9)
RBC.: 3.11 MIL/uL — ABNORMAL LOW (ref 4.22–5.81)
Retic Count, Absolute: 26.4 10*3/uL (ref 19.0–186.0)
Retic Ct Pct: 0.9 % (ref 0.4–3.1)

## 2020-07-11 LAB — CBC WITH DIFFERENTIAL/PLATELET
Abs Immature Granulocytes: 0.02 10*3/uL (ref 0.00–0.07)
Basophils Absolute: 0.1 10*3/uL (ref 0.0–0.1)
Basophils Relative: 1 %
Eosinophils Absolute: 0 10*3/uL (ref 0.0–0.5)
Eosinophils Relative: 1 %
HCT: 25.7 % — ABNORMAL LOW (ref 39.0–52.0)
Hemoglobin: 8.1 g/dL — ABNORMAL LOW (ref 13.0–17.0)
Immature Granulocytes: 0 %
Lymphocytes Relative: 15 %
Lymphs Abs: 0.9 10*3/uL (ref 0.7–4.0)
MCH: 25.8 pg — ABNORMAL LOW (ref 26.0–34.0)
MCHC: 31.5 g/dL (ref 30.0–36.0)
MCV: 81.8 fL (ref 80.0–100.0)
Monocytes Absolute: 0.7 10*3/uL (ref 0.1–1.0)
Monocytes Relative: 12 %
Neutro Abs: 4.1 10*3/uL (ref 1.7–7.7)
Neutrophils Relative %: 71 %
Platelets: 407 10*3/uL — ABNORMAL HIGH (ref 150–400)
RBC: 3.14 MIL/uL — ABNORMAL LOW (ref 4.22–5.81)
RDW: 14.6 % (ref 11.5–15.5)
WBC: 5.7 10*3/uL (ref 4.0–10.5)
nRBC: 0 % (ref 0.0–0.2)

## 2020-07-11 LAB — IRON AND TIBC
Iron: 19 ug/dL — ABNORMAL LOW (ref 42–163)
Saturation Ratios: 5 % — ABNORMAL LOW (ref 20–55)
TIBC: 410 ug/dL — ABNORMAL HIGH (ref 202–409)
UIBC: 391 ug/dL — ABNORMAL HIGH (ref 117–376)

## 2020-07-11 LAB — FERRITIN: Ferritin: 9 ng/mL — ABNORMAL LOW (ref 24–336)

## 2020-07-11 LAB — LACTATE DEHYDROGENASE: LDH: 153 U/L (ref 98–192)

## 2020-07-11 NOTE — Progress Notes (Signed)
[name] NOTE  Patient Care Team: [name], MD as PCP - General (Internal Medicine) [facility] [name], MD as Consulting Physician (Ophthalmology)  CHIEF COMPLAINTS/PURPOSE OF CONSULTATION:  Consult for anemia.  ASSESSMENT & PLAN:  IDA (iron deficiency anemia) This is a very pleasant 87 year old male patient with excellent performance status for his age referred to hematology for evaluation and recommendations regarding worsening anemia and iron deficiency.  He was most recently seen in the ED with episode of vertigo which resulted in a fall and he was noted to have a hemoglobin of 8 g/dL.  He was previously started on oral iron supplementation and has been taking it for about 2 to 3 months.  He has noticed some shortness of breath on exertion, minor weight loss of about 5pounds, episodes of vertigo which has been limiting him from doing many activities.  He has never had a colonoscopy, denies any known colon issues, cannot tell if he has any hematochezia or melena.  Physical examination today 87 year old, appears well appropriate for his age, no palpable lymphadenopathy or hepatosplenomegaly.  Chronic venous stasis changes bilateral lower extremities, multiple bruises on skin from thin skin, aging and frequent bumping as well as some arthritis noted in his hands. I reviewed his labs which showed previous evidence of iron deficiency, last ferritin noticed in November 2021.  I recommended we repeat a CBC, iron panel, folic acid, last [id number] in March was normal, hemolysis labs, SPEP for further evaluation of this anemia.  Have also recommended he follow-up with gastroenterology for further evaluation for blood loss anemia since he had stool positive for guaiac and he has never had a colonoscopy.  We have discussed about intravenous iron formularies, discussed about Injectafer primarily, discussed small risk of allergic/anaphylaxis reaction with  intravenous iron, phosphorus imbalance noted with Injectafer, otherwise it has been tolerated very well in most patients.  He is agreeable to proceed with Injectafer and we will arrange for this as soon as possible. No other major abnormalities noted in his CBC. He will return to clinic in about 4 weeks with lab results to discuss any additional recommendations.  Orders Placed This Encounter  Procedures  . CBC with Differential/Platelet    Standing Status:   Standing    Number of Occurrences:   22    Standing Expiration Date:   07/11/2021  . Iron and TIBC    Standing Status:   Future    Number of Occurrences:   1    Standing Expiration Date:   07/11/2021  . Ferritin    Standing Status:   Future    Number of Occurrences:   1    Standing Expiration Date:   07/11/2021  . SPEP with reflex to IFE    Standing Status:   Future    Number of Occurrences:   1    Standing Expiration Date:   07/11/2021  . Kappa/lambda light chains    Standing Status:   Future    Number of Occurrences:   1    Standing Expiration Date:   07/11/2021  . CMP ([facility] only)    Standing Status:   Future    Number of Occurrences:   1    Standing Expiration Date:   07/11/2021  . Folate RBC    Standing Status:   Future    Number of Occurrences:   1    Standing Expiration Date:   07/11/2021  . Lactate dehydrogenase  Standing Status:   Future    Number of Occurrences:   1    Standing Expiration Date:   07/11/2021  . Reticulocytes    Standing Status:   Future    Number of Occurrences:   1    Standing Expiration Date:   07/11/2021     HISTORY OF PRESENTING ILLNESS:  Alec Weiss 87 y.o. male is here because of anemia.  [name] is a very pleasant 87 year old male patient with past medical history significant for retinal detachment and eye surgery, chronic low back pain referred to hematology for evaluation and recommendations regarding his anemia.  [name] is a very healthy 87 year old male who is still  independent with his activities of daily living up until a couple weeks ago when he started having some episodes of vertigo.  He tells me that about 2 weeks ago he had 2 episodes when he started noticing that the room was spinning and he when he tried to get up he fell on his right side and this prompted the ED visit.  Prior to that he was very independent, participating in exercise programs, driving himself to places and did not have any complaints.  He has recently noticed some shortness of breath on exertion.  He cannot tell if there is any hematochezia or melena.  He has never had a colonoscopy.  He had coag positive stool in the ED and hence is referred to gastroenterology and he has a follow-up scheduled next week.  No family history of colon cancer.  He does take oral iron supplementation and has been taking it for about 2 to 3 months.  He also consumes a B12 supplementation.  Besides vertigo, shortness of breath on exertion, some weight loss which has self corrected, he denies any major complaints No dietary restrictions.  He has been forcing himself to eat more. No known gastric surgeries. Rest of the pertinent 10 point ROS reviewed and negative.  MEDICAL HISTORY:  Past Medical History:  Diagnosis Date  . Actinic keratosis 04/05/2012  . Insomnia, unspecified 06/08/2008  . Lumbago 01/02/2003  . 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 Pain in limb 02/05/2011  . Retinal detachment 2000   left eye  . Screening cholesterol level   . Shortness of breath 09/07/2008  . Vitamin D deficiency 08/04/2012    SURGICAL HISTORY: Past Surgical History:  Procedure Laterality Date  . EXTERNAL EAR SURGERY  05/2012   left ear   . INGUINAL HERNIA REPAIR Right 01/21/2005   Dr. [name]  . LID LESION EXCISION  2009  . MOHS SURGERY Left 05/28/2012   ear lobe Dr. [name]  . RETINAL DETACHMENT SURGERY Left 1997   [facility]    SOCIAL HISTORY: Social History   Socioeconomic History  . Marital  status: Widowed    Spouse name: Not on file  . Number of children: Not on file  . Years of education: Not on file  . Highest education level: Not on file  Occupational History  . Occupation: retired Hydrologist professor  Tobacco Use  . Smoking status: Never Smoker  . Smokeless tobacco: Never Used  Substance and Sexual Activity  . Alcohol use: Yes    Alcohol/week: 1.0 standard drink    Types: 1 Glasses of wine per week    Comment: at dinner every night.  . Drug use: No  . Sexual activity: Not on file  Other Topics Concern  . Not on file  Social History Narrative  Patient is  Married [name]) since 1958--widowed a few years ago (updating 2021). Retired professor, Hydrologist. Lives in apartment, Independent Living section at [facility] since 02/2012.       No Smoking history, drinks moderate amount of alcohol    Patient has Advanced planning documents: DNR   Exercise walking - daily ~1 1/2 - 2 miles (~ 1hr daily) as well as [employer] Exercise classes.   Social Determinants of Health   Financial Resource Strain: Not on file  Food Insecurity: Not on file  Transportation Needs: Not on file  Physical Activity: Not on file  Stress: Not on file  Social Connections: Not on file  Intimate Partner Violence: Not on file    FAMILY HISTORY: Family History  Problem Relation Age of Onset  . Cancer Mother        breast    ALLERGIES:  is allergic to benzalkonium chloride, maxitrol [neomycin-polymyxin-dexameth], and neosporin [neomycin-bacitracin zn-polymyx].  MEDICATIONS:  Current Outpatient Medications  Medication Sig Dispense Refill  . clotrimazole (LOTRIMIN) 1 % cream Apply 1 application topically 2 (two) times daily. To left knee for two dry scaly areas until resolved 40 g 0  . esomeprazole (NEXIUM) 40 MG capsule Take 1 capsule (40 mg total) by mouth daily. 30 capsule 3  . iron polysaccharides (NU-IRON) 150 MG capsule Take 1 capsule (150 mg total) by mouth daily. 30  capsule 3  . vitamin B-12 (CYANOCOBALAMIN) 1000 MCG tablet Take 1 tablet (1,000 mcg total) by mouth daily. 90 tablet 1  . Vitamin D, Ergocalciferol, (DRISDOL) 1.25 MG (50000 UNIT) CAPS capsule TAKE ONE CAPSULE ONCE A WEEK FOR VITAMIN D SUPPLEMENT. 4 capsule 1   No current facility-administered medications for this visit.     PHYSICAL EXAMINATION: ECOG PERFORMANCE STATUS: 1 - Symptomatic but completely ambulatory  Vitals:   07/11/20 1005  BP: (!) 148/74  Pulse: 85  Resp: 18  Temp: 98.3 F (36.8 C)  SpO2: 99%   Filed Weights   07/11/20 1005  Weight: 150 lb 4.8 oz (68.2 kg)   GENERAL:alert, no distress and comfortable SKIN: Thin skin, multiple bruises noted which is likely from aging.   EYES: normal, conjunctiva are pink and non-injected, sclera clear OROPHARYNX:no exudate, no erythema and lips, buccal mucosa, and tongue normal  NECK: supple, thyroid normal size, non-tender, without nodularity.  Asymmetry of the shoulder noted, right shoulder droops below the left shoulder which is chronic according to the patient. LYMPH:  no palpable lymphadenopathy in the cervical, axillary LUNGS: clear to auscultation and percussion with normal breathing effort HEART: regular rate & rhythm and no murmurs and no lower extremity edema ABDOMEN:abdomen soft, non-tender and normal bowel sounds.  No palpable hepatosplenomegaly. Musculoskeletal:no cyanosis of digits and no clubbing.  Bilateral lower extremity swelling 1+ with some chronic venous stasis changes. PSYCH: alert & oriented x 3 with fluent speech NEURO: no focal motor/sensory deficits  LABORATORY DATA:  I have reviewed the data as listed Lab Results  Component Value Date   WBC 5.7 07/11/2020   HGB 8.1 (L) 07/11/2020   HCT 25.7 (L) 07/11/2020   MCV 81.8 07/11/2020   PLT 407 (H) 07/11/2020     Chemistry      Component Value Date/Time   NA 137 07/11/2020 1120   NA 132 (A) 07/05/2020 1528   K 4.2 07/11/2020 1120   CL 101  07/11/2020 1120   CO2 28 07/11/2020 1120   BUN 12 07/11/2020 1120   BUN 16 07/05/2020 1528  CREATININE 0.81 07/11/2020 1120   GLU 105 07/05/2020 1528      Component Value Date/Time   CALCIUM 9.5 07/11/2020 1120   ALKPHOS 86 07/11/2020 1120   AST 17 07/11/2020 1120   ALT 8 07/11/2020 1120   BILITOT 0.4 07/11/2020 1120       RADIOGRAPHIC STUDIES: I have personally reviewed the radiological images as listed and agreed with the findings in the report. CT Head Wo Contrast  Result Date: 06/24/2020 CLINICAL DATA:  Nonspecific dizziness.  Fall. EXAM: CT HEAD WITHOUT CONTRAST TECHNIQUE: Contiguous axial images were obtained from the base of the skull through the vertex without intravenous contrast. COMPARISON:  May 07, 2020 FINDINGS: Brain: No subdural, epidural, or subarachnoid hemorrhage. Cerebellum, brainstem, and basal cisterns are noted. No mass effect or midline shift. Ventricles and sulci are unremarkable. Mild white matter changes remain. No acute cortical ischemia or infarct identified. Vascular: No hyperdense vessel or unexpected calcification. Skull: Normal. Negative for fracture or focal lesion. Sinuses/Orbits: No acute finding. Other: Postoperative changes to the left globe. Extracranial soft tissues otherwise normal. IMPRESSION: No acute intracranial abnormalities are identified. Electronically Signed   By: Dorise Bullion III M.D   On: 06/24/2020 13:00   MR ANGIO HEAD WO CONTRAST  Result Date: 06/24/2020 CLINICAL DATA:  Dizziness. EXAM: MR HEAD WITHOUT CONTRAST MR CIRCLE OF WILLIS WITHOUT CONTRAST MRA OF THE NECK WITHOUT AND WITH CONTRAST TECHNIQUE: Multiplanar, multiecho pulse sequences of the brain, circle of willis and surrounding structures were obtained without intravenous contrast. Angiographic images of the neck were obtained using MRA technique without and with intravenous contrast. CONTRAST:  56mL GADAVIST GADOBUTROL 1 MMOL/ML IV SOLN COMPARISON:  Head CT 06/24/2020 FINDINGS:  MR HEAD FINDINGS Brain: There is no evidence of an acute infarct, mass, midline shift, or extra-axial fluid collection. A punctate focus of susceptibility artifact in the posterolateral aspect of the medulla on the left may reflect a chronic microhemorrhage. T2 hyperintensities in the cerebral white matter bilaterally are nonspecific but compatible with mild chronic small vessel ischemic disease. There is a chronic lacunar infarct in the left thalamus. Mild cerebral atrophy is within normal limits for age. Vascular: Major intracranial vascular flow voids are preserved. Skull and upper cervical spine: Unremarkable bone marrow signal. Sinuses/Orbits: Bilateral cataract extraction. Left scleral buckle. Paranasal sinuses and mastoid air cells are clear. Other: None. MR CIRCLE OF WILLIS FINDINGS The included intracranial portions of the vertebral arteries are widely patent to the basilar. Patent bilateral PICA, left AICA, and bilateral SCA origins are visualized. A right AICA is not identified. The basilar artery is widely patent. There are large posterior communicating arteries bilaterally with hypoplasia of the right P1 segment. Both PCAs are patent without evidence of significant proximal stenosis. The internal carotid arteries are widely patent from skull base to carotid termini. ACAs and MCAs are patent without evidence of a proximal branch occlusion or significant proximal stenosis. No aneurysm is identified. MRA NECK FINDINGS Timing is suboptimal on the contrast-enhanced neck MRA. There is a standard 3 vessel aortic arch. The common carotid and cervical internal carotid arteries are patent without evidence of a significant stenosis or dissection. The vertebral arteries are patent and codominant with antegrade flow bilaterally. No significant vertebral artery stenosis is identified although the origin of the left vertebral artery is poorly evaluated due to motion and contrast timing. IMPRESSION: 1. No acute  intracranial abnormality. 2. Mild chronic small vessel ischemic disease. Chronic left thalamic lacunar infarct. 3. Negative head MRA. 4. Patent cervical  carotid and vertebral arteries without evidence of a significant stenosis with note made of poor visualization of the left vertebral artery origin. Electronically Signed   By: Logan Bores M.D.   On: 06/24/2020 18:07   MR Angiogram Neck W or Wo Contrast  Result Date: 06/24/2020 CLINICAL DATA:  Dizziness. EXAM: MR HEAD WITHOUT CONTRAST MR CIRCLE OF WILLIS WITHOUT CONTRAST MRA OF THE NECK WITHOUT AND WITH CONTRAST TECHNIQUE: Multiplanar, multiecho pulse sequences of the brain, circle of willis and surrounding structures were obtained without intravenous contrast. Angiographic images of the neck were obtained using MRA technique without and with intravenous contrast. CONTRAST:  81mL GADAVIST GADOBUTROL 1 MMOL/ML IV SOLN COMPARISON:  Head CT 06/24/2020 FINDINGS: MR HEAD FINDINGS Brain: There is no evidence of an acute infarct, mass, midline shift, or extra-axial fluid collection. A punctate focus of susceptibility artifact in the posterolateral aspect of the medulla on the left may reflect a chronic microhemorrhage. T2 hyperintensities in the cerebral white matter bilaterally are nonspecific but compatible with mild chronic small vessel ischemic disease. There is a chronic lacunar infarct in the left thalamus. Mild cerebral atrophy is within normal limits for age. Vascular: Major intracranial vascular flow voids are preserved. Skull and upper cervical spine: Unremarkable bone marrow signal. Sinuses/Orbits: Bilateral cataract extraction. Left scleral buckle. Paranasal sinuses and mastoid air cells are clear. Other: None. MR CIRCLE OF WILLIS FINDINGS The included intracranial portions of the vertebral arteries are widely patent to the basilar. Patent bilateral PICA, left AICA, and bilateral SCA origins are visualized. A right AICA is not identified. The basilar artery  is widely patent. There are large posterior communicating arteries bilaterally with hypoplasia of the right P1 segment. Both PCAs are patent without evidence of significant proximal stenosis. The internal carotid arteries are widely patent from skull base to carotid termini. ACAs and MCAs are patent without evidence of a proximal branch occlusion or significant proximal stenosis. No aneurysm is identified. MRA NECK FINDINGS Timing is suboptimal on the contrast-enhanced neck MRA. There is a standard 3 vessel aortic arch. The common carotid and cervical internal carotid arteries are patent without evidence of a significant stenosis or dissection. The vertebral arteries are patent and codominant with antegrade flow bilaterally. No significant vertebral artery stenosis is identified although the origin of the left vertebral artery is poorly evaluated due to motion and contrast timing. IMPRESSION: 1. No acute intracranial abnormality. 2. Mild chronic small vessel ischemic disease. Chronic left thalamic lacunar infarct. 3. Negative head MRA. 4. Patent cervical carotid and vertebral arteries without evidence of a significant stenosis with note made of poor visualization of the left vertebral artery origin. Electronically Signed   By: Logan Bores M.D.   On: 06/24/2020 18:07   MR BRAIN WO CONTRAST  Result Date: 06/24/2020 CLINICAL DATA:  Dizziness. EXAM: MR HEAD WITHOUT CONTRAST MR CIRCLE OF WILLIS WITHOUT CONTRAST MRA OF THE NECK WITHOUT AND WITH CONTRAST TECHNIQUE: Multiplanar, multiecho pulse sequences of the brain, circle of willis and surrounding structures were obtained without intravenous contrast. Angiographic images of the neck were obtained using MRA technique without and with intravenous contrast. CONTRAST:  4mL GADAVIST GADOBUTROL 1 MMOL/ML IV SOLN COMPARISON:  Head CT 06/24/2020 FINDINGS: MR HEAD FINDINGS Brain: There is no evidence of an acute infarct, mass, midline shift, or extra-axial fluid collection.  A punctate focus of susceptibility artifact in the posterolateral aspect of the medulla on the left may reflect a chronic microhemorrhage. T2 hyperintensities in the cerebral white matter bilaterally are nonspecific but  compatible with mild chronic small vessel ischemic disease. There is a chronic lacunar infarct in the left thalamus. Mild cerebral atrophy is within normal limits for age. Vascular: Major intracranial vascular flow voids are preserved. Skull and upper cervical spine: Unremarkable bone marrow signal. Sinuses/Orbits: Bilateral cataract extraction. Left scleral buckle. Paranasal sinuses and mastoid air cells are clear. Other: None. MR CIRCLE OF WILLIS FINDINGS The included intracranial portions of the vertebral arteries are widely patent to the basilar. Patent bilateral PICA, left AICA, and bilateral SCA origins are visualized. A right AICA is not identified. The basilar artery is widely patent. There are large posterior communicating arteries bilaterally with hypoplasia of the right P1 segment. Both PCAs are patent without evidence of significant proximal stenosis. The internal carotid arteries are widely patent from skull base to carotid termini. ACAs and MCAs are patent without evidence of a proximal branch occlusion or significant proximal stenosis. No aneurysm is identified. MRA NECK FINDINGS Timing is suboptimal on the contrast-enhanced neck MRA. There is a standard 3 vessel aortic arch. The common carotid and cervical internal carotid arteries are patent without evidence of a significant stenosis or dissection. The vertebral arteries are patent and codominant with antegrade flow bilaterally. No significant vertebral artery stenosis is identified although the origin of the left vertebral artery is poorly evaluated due to motion and contrast timing. IMPRESSION: 1. No acute intracranial abnormality. 2. Mild chronic small vessel ischemic disease. Chronic left thalamic lacunar infarct. 3. Negative  head MRA. 4. Patent cervical carotid and vertebral arteries without evidence of a significant stenosis with note made of poor visualization of the left vertebral artery origin. Electronically Signed   By: Logan Bores M.D.   On: 06/24/2020 18:07    All questions were answered. The patient knows to call the clinic with any problems, questions or concerns. I spent 45 minutes in the care of this patient including H and P, review of records, counseling and coordination of care. We have reviewed his ED records, lab results, discussed about common causes of iron deficiency anemia, discussed about intravenous iron replacement and adverse effects and counseled improving nutrition and following up with his other specialist including gastroenterologist.    Benay Pike, MD 07/11/2020 1:22 PM

## 2020-07-11 NOTE — Assessment & Plan Note (Signed)
This is a very pleasant 87 year old male patient with excellent performance status for his age referred to hematology for evaluation and recommendations regarding worsening anemia and iron deficiency.  He was most recently seen in the ED with episode of vertigo which resulted in a fall and he was noted to have a hemoglobin of 8 g/dL.  He was previously started on oral iron supplementation and has been taking it for about 2 to 3 months.  He has noticed some shortness of breath on exertion, minor weight loss of about 5pounds, episodes of vertigo which has been limiting him from doing many activities.  He has never had a colonoscopy, denies any known colon issues, cannot tell if he has any hematochezia or melena.  Physical examination today 87 year old, appears well appropriate for his age, no palpable lymphadenopathy or hepatosplenomegaly.  Chronic venous stasis changes bilateral lower extremities, multiple bruises on skin from thin skin, aging and frequent bumping as well as some arthritis noted in his hands. I reviewed his labs which showed previous evidence of iron deficiency, last ferritin noticed in November 2021.  I recommended we repeat a CBC, iron panel, folic acid, last V77 in March was normal, hemolysis labs, SPEP for further evaluation of this anemia.  Have also recommended he follow-up with gastroenterology for further evaluation for blood loss anemia since he had stool positive for guaiac and he has never had a colonoscopy.  We have discussed about intravenous iron formularies, discussed about Injectafer primarily, discussed small risk of allergic/anaphylaxis reaction with intravenous iron, phosphorus imbalance noted with Injectafer, otherwise it has been tolerated very well in most patients.  He is agreeable to proceed with Injectafer and we will arrange for this as soon as possible. No other major abnormalities noted in his CBC. He will return to clinic in about 4 weeks with lab results to discuss  any additional recommendations.

## 2020-07-12 LAB — KAPPA/LAMBDA LIGHT CHAINS
Kappa free light chain: 28.8 mg/L — ABNORMAL HIGH (ref 3.3–19.4)
Kappa, lambda light chain ratio: 1.82 — ABNORMAL HIGH (ref 0.26–1.65)
Lambda free light chains: 15.8 mg/L (ref 5.7–26.3)

## 2020-07-12 LAB — FOLATE RBC
Folate, Hemolysate: 308 ng/mL
Folate, RBC: 1137 ng/mL (ref >498)
Hematocrit: 27.1 % — ABNORMAL LOW (ref 37.5–51.0)

## 2020-07-13 ENCOUNTER — Telehealth: Payer: Self-pay | Admitting: Hematology and Oncology

## 2020-07-13 NOTE — Telephone Encounter (Signed)
Scheduled per 05/11 los, patient has been called and voicemail was left. 

## 2020-07-14 ENCOUNTER — Other Ambulatory Visit: Payer: Self-pay | Admitting: Hematology and Oncology

## 2020-07-16 ENCOUNTER — Non-Acute Institutional Stay: Payer: Medicare PPO | Admitting: Adult Health

## 2020-07-16 ENCOUNTER — Other Ambulatory Visit: Payer: Self-pay

## 2020-07-16 ENCOUNTER — Encounter: Payer: Self-pay | Admitting: Adult Health

## 2020-07-16 VITALS — BP 126/74 | HR 79 | Temp 97.9°F | Ht 71.0 in | Wt 149.0 lb

## 2020-07-16 DIAGNOSIS — R42 Dizziness and giddiness: Secondary | ICD-10-CM

## 2020-07-16 DIAGNOSIS — D500 Iron deficiency anemia secondary to blood loss (chronic): Secondary | ICD-10-CM

## 2020-07-16 DIAGNOSIS — R634 Abnormal weight loss: Secondary | ICD-10-CM

## 2020-07-16 LAB — IMMUNOFIXATION REFLEX, SERUM
IgA: 199 mg/dL (ref 61–437)
IgG (Immunoglobin G), Serum: 749 mg/dL (ref 603–1613)
IgM (Immunoglobulin M), Srm: 689 mg/dL — ABNORMAL HIGH (ref 15–143)

## 2020-07-16 LAB — PROTEIN ELECTROPHORESIS, SERUM, WITH REFLEX
A/G Ratio: 1.1 (ref 0.7–1.7)
Albumin ELP: 3.5 g/dL (ref 2.9–4.4)
Alpha-1-Globulin: 0.3 g/dL (ref 0.0–0.4)
Alpha-2-Globulin: 0.9 g/dL (ref 0.4–1.0)
Beta Globulin: 0.9 g/dL (ref 0.7–1.3)
Gamma Globulin: 1.1 g/dL (ref 0.4–1.8)
Globulin, Total: 3.2 g/dL (ref 2.2–3.9)
M-Spike, %: 0.4 g/dL — ABNORMAL HIGH
SPEP Interpretation: 0
Total Protein ELP: 6.7 g/dL (ref 6.0–8.5)

## 2020-07-16 NOTE — Progress Notes (Signed)
Location:  Personal assistant  POS: clinic  Provider:  Cindi Carbon, [place] [phone]  Code Status: DNR Goals of Care:  Advanced Directives 07/16/2020  Does Patient Have a Medical Advance Directive? Yes  Type of Advance Directive -  Does patient want to make changes to medical advance directive? No - Patient declined  Copy of [place] in Chart? -  Would patient like information on creating a medical advance directive? -  Pre-existing out of facility DNR order (yellow form or pink MOST form) -     Chief Complaint  Patient presents with  . Medical Management of Chronic Issues    Medical Management of Chronic Issues. 2 week Discharge Follow up    HPI: Patient is a 87 y.o. male seen for follow up s/p rehab admission due to dizziness. He was seen in the ED on 4/24 for dizziness with MRI/MRA ordered which did not show stroke or dissection. His Hgb had dropped from 10 to 8.  He was started on PPI therapy and then admitted to [facility]. He was having some ataxia and needed to use a walker, took meclizine and participated in physical therapy.  Stool was positive for blood. He was doing better at [facility] and discharged to his home and soon after felt weak and sob. Labs were re ordered Hgb was unchanged at 8.1.  He was waiting on a GI apt so we sent him to hematology for iron infusion as oral iron was not helping. He had been on iron therapy for several months with no improvement and had a CT of the abd which showed no acute reason for the GIB and had declined work up at that time due to his age/goals of care. IV iron infusion has been recommended by hematology and he will have his infusion this week. He is also seeing GI this week. He has not had any further dizziness, sob, or weakness.  Past Medical History:  Diagnosis Date  . Actinic keratosis 04/05/2012  . Insomnia, unspecified 06/08/2008  . Lumbago 01/02/2003  . 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 Pain in limb 02/05/2011  . Retinal detachment 2000   left eye  . Screening cholesterol level   . Shortness of breath 09/07/2008  . Vitamin D deficiency 08/04/2012    Past Surgical History:  Procedure Laterality Date  . EXTERNAL EAR SURGERY  05/2012   left ear   . INGUINAL HERNIA REPAIR Right 01/21/2005   Dr. [name]  . LID LESION EXCISION  2009  . MOHS SURGERY Left 05/28/2012   ear lobe Dr. [name]  . RETINAL DETACHMENT SURGERY Left [facility]    Allergies  Allergen Reactions  . Benzalkonium Chloride Rash    Very allergic per patient   . Maxitrol [Neomycin-Polymyxin-Dexameth] Itching and Rash  . Neosporin [Neomycin-Bacitracin Zn-Polymyx] Itching and Rash    Outpatient Encounter Medications as of 07/16/2020  Medication Sig  . clotrimazole (LOTRIMIN) 1 % cream Apply 1 application topically 2 (two) times daily. To left knee for two dry scaly areas until resolved  . esomeprazole (NEXIUM) 40 MG capsule Take 1 capsule (40 mg total) by mouth daily.  . iron polysaccharides (NU-IRON) 150 MG capsule Take 1 capsule (150 mg total) by mouth daily.  . vitamin B-12 (CYANOCOBALAMIN) 1000 MCG tablet Take 1 tablet (1,000 mcg total) by mouth daily.  . Vitamin D, Ergocalciferol, (DRISDOL) 1.25 MG (50000 UNIT) CAPS capsule TAKE ONE CAPSULE ONCE  A WEEK FOR VITAMIN D SUPPLEMENT.   No facility-administered encounter medications on file as of 07/16/2020.    Review of Systems:  Review of Systems  Constitutional: Negative for activity change, appetite change, chills, diaphoresis, fatigue, fever and unexpected weight change.  Respiratory: Negative for cough, shortness of breath, wheezing and stridor.   Cardiovascular: Negative for chest pain, palpitations and leg swelling.  Gastrointestinal: Negative for abdominal distention, abdominal pain, constipation, diarrhea, nausea and vomiting.       Has periods of diarrhea and constipation  Genitourinary: Negative  for difficulty urinating, dysuria and urgency. Testicular pain:    Musculoskeletal: Positive for gait problem. Negative for arthralgias, back pain, joint swelling, myalgias and neck pain.  Skin: Negative for rash.  Neurological: Positive for weakness. Negative for dizziness, seizures, syncope, facial asymmetry, speech difficulty and headaches.  Hematological: Negative for adenopathy. Does not bruise/bleed easily.  Psychiatric/Behavioral: Negative for agitation, behavioral problems and confusion.    Health Maintenance  Topic Date Due  . COVID-19 Vaccine (4 - Booster) 04/18/2020  . INFLUENZA VACCINE  10/01/2020  . TETANUS/TDAP  05/08/2030  . PNA vac Low Risk Adult  Completed  . HPV VACCINES  Aged Out    Physical Exam: Vitals:   07/16/20 1455  BP: 126/74  Pulse: 79  Temp: 97.9 F (36.6 C)  TempSrc: Skin  SpO2: 97%  Weight: 149 lb (67.6 kg)  Height: 5\' 11"  (1.803 m)   Body mass index is 20.78 kg/m.  Wt Readings from Last 3 Encounters:  07/16/20 149 lb (67.6 kg)  07/11/20 150 lb 4.8 oz (68.2 kg)  06/25/20 155 lb (70.3 kg)    Physical Exam Vitals and nursing note reviewed.  Constitutional:      General: He is not in acute distress.    Appearance: He is not diaphoretic.  HENT:     Head: Normocephalic and atraumatic.  Neck:     Thyroid: No thyromegaly.     Vascular: No JVD.     Trachea: No tracheal deviation.  Cardiovascular:     Rate and Rhythm: Normal rate and regular rhythm.     Heart sounds: Murmur heard.    Pulmonary:     Effort: Pulmonary effort is normal. No respiratory distress.     Breath sounds: Normal breath sounds. No wheezing.  Abdominal:     General: Bowel sounds are normal. There is no distension.     Palpations: Abdomen is soft.     Tenderness: There is no abdominal tenderness.     Comments: Rotund abd  Musculoskeletal:        General: No swelling, tenderness, deformity or signs of injury.     Right lower leg: No edema.     Left lower leg: No  edema.  Lymphadenopathy:     Cervical: No cervical adenopathy.  Skin:    General: Skin is warm and dry.     Findings: Bruising (scattered purpura to BUE and BLE) present.  Neurological:     Mental Status: He is alert and oriented to person, place, and time.     Cranial Nerves: No cranial nerve deficit.  Psychiatric:        Mood and Affect: Mood normal.     Labs reviewed: Basic Metabolic Panel: Recent Labs    06/24/20 1217 06/27/20 0000 07/05/20 1528 07/11/20 1120  NA 129* 133* 132* 137  K 3.8 4.4 4.6 4.2  CL 96* 98* 98* 101  CO2 27 28* 27* 28  GLUCOSE 133*  --   --  91  BUN 14 12 16 12   CREATININE 0.60* 0.8 0.9 0.81  CALCIUM 8.8* 9.0 9.0 9.5   Liver Function Tests: Recent Labs    06/24/20 1217 06/27/20 0000 07/05/20 1528 07/11/20 1120  AST 12* 11* 14 17  ALT 8 7* 9* 8  ALKPHOS 66 87 68 86  BILITOT 0.4  --   --  0.4  PROT 6.2*  --   --  7.0  ALBUMIN 3.7 3.2* 3.7 3.5   No results for input(s): LIPASE, AMYLASE in the last 8760 hours. No results for input(s): AMMONIA in the last 8760 hours. CBC: Recent Labs    06/24/20 1217 06/27/20 0000 07/05/20 1528 07/11/20 1120  WBC 4.8 5.4 6.5 5.7  NEUTROABS 3.7  --  4.60 4.1  HGB 8.4* 8.1* 8.1* 8.1*  HCT 26.6* 24* 26* 25.7*  27.1*  MCV 82.4  --   --  81.8  PLT 321 347 400* 407*   Lipid Panel: No results for input(s): CHOL, HDL, LDLCALC, TRIG, CHOLHDL, LDLDIRECT in the last 8760 hours. Lab Results  Component Value Date   HGBA1C 5.9 03/09/2012    Procedures since last visit: CT Head Wo Contrast  Result Date: 06/24/2020 CLINICAL DATA:  Nonspecific dizziness.  Fall. EXAM: CT HEAD WITHOUT CONTRAST TECHNIQUE: Contiguous axial images were obtained from the base of the skull through the vertex without intravenous contrast. COMPARISON:  May 07, 2020 FINDINGS: Brain: No subdural, epidural, or subarachnoid hemorrhage. Cerebellum, brainstem, and basal cisterns are noted. No mass effect or midline shift. Ventricles and  sulci are unremarkable. Mild white matter changes remain. No acute cortical ischemia or infarct identified. Vascular: No hyperdense vessel or unexpected calcification. Skull: Normal. Negative for fracture or focal lesion. Sinuses/Orbits: No acute finding. Other: Postoperative changes to the left globe. Extracranial soft tissues otherwise normal. IMPRESSION: No acute intracranial abnormalities are identified. Electronically Signed   By: Dorise Bullion III M.D   On: 06/24/2020 13:00   MR ANGIO HEAD WO CONTRAST  Result Date: 06/24/2020 CLINICAL DATA:  Dizziness. EXAM: MR HEAD WITHOUT CONTRAST MR CIRCLE OF WILLIS WITHOUT CONTRAST MRA OF THE NECK WITHOUT AND WITH CONTRAST TECHNIQUE: Multiplanar, multiecho pulse sequences of the brain, circle of willis and surrounding structures were obtained without intravenous contrast. Angiographic images of the neck were obtained using MRA technique without and with intravenous contrast. CONTRAST:  22mL GADAVIST GADOBUTROL 1 MMOL/ML IV SOLN COMPARISON:  Head CT 06/24/2020 FINDINGS: MR HEAD FINDINGS Brain: There is no evidence of an acute infarct, mass, midline shift, or extra-axial fluid collection. A punctate focus of susceptibility artifact in the posterolateral aspect of the medulla on the left may reflect a chronic microhemorrhage. T2 hyperintensities in the cerebral white matter bilaterally are nonspecific but compatible with mild chronic small vessel ischemic disease. There is a chronic lacunar infarct in the left thalamus. Mild cerebral atrophy is within normal limits for age. Vascular: Major intracranial vascular flow voids are preserved. Skull and upper cervical spine: Unremarkable bone marrow signal. Sinuses/Orbits: Bilateral cataract extraction. Left scleral buckle. Paranasal sinuses and mastoid air cells are clear. Other: None. MR CIRCLE OF WILLIS FINDINGS The included intracranial portions of the vertebral arteries are widely patent to the basilar. Patent bilateral  PICA, left AICA, and bilateral SCA origins are visualized. A right AICA is not identified. The basilar artery is widely patent. There are large posterior communicating arteries bilaterally with hypoplasia of the right P1 segment. Both PCAs are patent without evidence of significant proximal stenosis. The internal carotid arteries  are widely patent from skull base to carotid termini. ACAs and MCAs are patent without evidence of a proximal branch occlusion or significant proximal stenosis. No aneurysm is identified. MRA NECK FINDINGS Timing is suboptimal on the contrast-enhanced neck MRA. There is a standard 3 vessel aortic arch. The common carotid and cervical internal carotid arteries are patent without evidence of a significant stenosis or dissection. The vertebral arteries are patent and codominant with antegrade flow bilaterally. No significant vertebral artery stenosis is identified although the origin of the left vertebral artery is poorly evaluated due to motion and contrast timing. IMPRESSION: 1. No acute intracranial abnormality. 2. Mild chronic small vessel ischemic disease. Chronic left thalamic lacunar infarct. 3. Negative head MRA. 4. Patent cervical carotid and vertebral arteries without evidence of a significant stenosis with note made of poor visualization of the left vertebral artery origin. Electronically Signed   By: Logan Bores M.D.   On: 06/24/2020 18:07   MR Angiogram Neck W or Wo Contrast  Result Date: 06/24/2020 CLINICAL DATA:  Dizziness. EXAM: MR HEAD WITHOUT CONTRAST MR CIRCLE OF WILLIS WITHOUT CONTRAST MRA OF THE NECK WITHOUT AND WITH CONTRAST TECHNIQUE: Multiplanar, multiecho pulse sequences of the brain, circle of willis and surrounding structures were obtained without intravenous contrast. Angiographic images of the neck were obtained using MRA technique without and with intravenous contrast. CONTRAST:  29mL GADAVIST GADOBUTROL 1 MMOL/ML IV SOLN COMPARISON:  Head CT 06/24/2020  FINDINGS: MR HEAD FINDINGS Brain: There is no evidence of an acute infarct, mass, midline shift, or extra-axial fluid collection. A punctate focus of susceptibility artifact in the posterolateral aspect of the medulla on the left may reflect a chronic microhemorrhage. T2 hyperintensities in the cerebral white matter bilaterally are nonspecific but compatible with mild chronic small vessel ischemic disease. There is a chronic lacunar infarct in the left thalamus. Mild cerebral atrophy is within normal limits for age. Vascular: Major intracranial vascular flow voids are preserved. Skull and upper cervical spine: Unremarkable bone marrow signal. Sinuses/Orbits: Bilateral cataract extraction. Left scleral buckle. Paranasal sinuses and mastoid air cells are clear. Other: None. MR CIRCLE OF WILLIS FINDINGS The included intracranial portions of the vertebral arteries are widely patent to the basilar. Patent bilateral PICA, left AICA, and bilateral SCA origins are visualized. A right AICA is not identified. The basilar artery is widely patent. There are large posterior communicating arteries bilaterally with hypoplasia of the right P1 segment. Both PCAs are patent without evidence of significant proximal stenosis. The internal carotid arteries are widely patent from skull base to carotid termini. ACAs and MCAs are patent without evidence of a proximal branch occlusion or significant proximal stenosis. No aneurysm is identified. MRA NECK FINDINGS Timing is suboptimal on the contrast-enhanced neck MRA. There is a standard 3 vessel aortic arch. The common carotid and cervical internal carotid arteries are patent without evidence of a significant stenosis or dissection. The vertebral arteries are patent and codominant with antegrade flow bilaterally. No significant vertebral artery stenosis is identified although the origin of the left vertebral artery is poorly evaluated due to motion and contrast timing. IMPRESSION: 1. No  acute intracranial abnormality. 2. Mild chronic small vessel ischemic disease. Chronic left thalamic lacunar infarct. 3. Negative head MRA. 4. Patent cervical carotid and vertebral arteries without evidence of a significant stenosis with note made of poor visualization of the left vertebral artery origin. Electronically Signed   By: Logan Bores M.D.   On: 06/24/2020 18:07   MR BRAIN WO CONTRAST  Result Date: 06/24/2020 CLINICAL DATA:  Dizziness. EXAM: MR HEAD WITHOUT CONTRAST MR CIRCLE OF WILLIS WITHOUT CONTRAST MRA OF THE NECK WITHOUT AND WITH CONTRAST TECHNIQUE: Multiplanar, multiecho pulse sequences of the brain, circle of willis and surrounding structures were obtained without intravenous contrast. Angiographic images of the neck were obtained using MRA technique without and with intravenous contrast. CONTRAST:  64mL GADAVIST GADOBUTROL 1 MMOL/ML IV SOLN COMPARISON:  Head CT 06/24/2020 FINDINGS: MR HEAD FINDINGS Brain: There is no evidence of an acute infarct, mass, midline shift, or extra-axial fluid collection. A punctate focus of susceptibility artifact in the posterolateral aspect of the medulla on the left may reflect a chronic microhemorrhage. T2 hyperintensities in the cerebral white matter bilaterally are nonspecific but compatible with mild chronic small vessel ischemic disease. There is a chronic lacunar infarct in the left thalamus. Mild cerebral atrophy is within normal limits for age. Vascular: Major intracranial vascular flow voids are preserved. Skull and upper cervical spine: Unremarkable bone marrow signal. Sinuses/Orbits: Bilateral cataract extraction. Left scleral buckle. Paranasal sinuses and mastoid air cells are clear. Other: None. MR CIRCLE OF WILLIS FINDINGS The included intracranial portions of the vertebral arteries are widely patent to the basilar. Patent bilateral PICA, left AICA, and bilateral SCA origins are visualized. A right AICA is not identified. The basilar artery is  widely patent. There are large posterior communicating arteries bilaterally with hypoplasia of the right P1 segment. Both PCAs are patent without evidence of significant proximal stenosis. The internal carotid arteries are widely patent from skull base to carotid termini. ACAs and MCAs are patent without evidence of a proximal branch occlusion or significant proximal stenosis. No aneurysm is identified. MRA NECK FINDINGS Timing is suboptimal on the contrast-enhanced neck MRA. There is a standard 3 vessel aortic arch. The common carotid and cervical internal carotid arteries are patent without evidence of a significant stenosis or dissection. The vertebral arteries are patent and codominant with antegrade flow bilaterally. No significant vertebral artery stenosis is identified although the origin of the left vertebral artery is poorly evaluated due to motion and contrast timing. IMPRESSION: 1. No acute intracranial abnormality. 2. Mild chronic small vessel ischemic disease. Chronic left thalamic lacunar infarct. 3. Negative head MRA. 4. Patent cervical carotid and vertebral arteries without evidence of a significant stenosis with note made of poor visualization of the left vertebral artery origin. Electronically Signed   By: Logan Bores M.D.   On: 06/24/2020 18:07    Assessment/Plan 1. Iron deficiency anemia due to chronic blood loss No change. Followed by hematology for oral iron infusion F/u with GI tomorrow for eval for slow GI blood loss I let him know the nexium is important and should be taken 30-1hr prior to the meal  A med list was given   2. Dizziness He had some vertigo and required meclizine but this resolved. He continues to use a walker for distances to avoid falls.  F/U with neurology in July   3. Weight loss Wt Readings from Last 3 Encounters:  07/16/20 149 lb (67.6 kg)  07/11/20 150 lb 4.8 oz (68.2 kg)  06/25/20 155 lb (70.3 kg)   Has lost 6 lbs in the past month which is  concerning with his anemia. Await GI input     Labs/tests ordered:  Will be done by hematology  Next appt:  08/29/2020 with Dr Lyndel Safe   Total time 44min:  time greater than 50% of total time spent doing pt counseling and coordination of care

## 2020-07-16 NOTE — Patient Instructions (Signed)
Please take the nexium which is to reduce acid 30 min to 1 hr prior to the meal.   Keep follow up apt with GI to help ascertain the reason for your chronic blood loss and the iron infusions should help your Hgb improve and hence will help your feeling any feelings of weakness or dizziness

## 2020-07-17 ENCOUNTER — Ambulatory Visit (INDEPENDENT_AMBULATORY_CARE_PROVIDER_SITE_OTHER): Payer: Medicare PPO | Admitting: Gastroenterology

## 2020-07-17 ENCOUNTER — Other Ambulatory Visit: Payer: Self-pay

## 2020-07-17 ENCOUNTER — Encounter: Payer: Self-pay | Admitting: Gastroenterology

## 2020-07-17 VITALS — BP 100/58 | HR 81 | Ht 71.0 in | Wt 146.0 lb

## 2020-07-17 DIAGNOSIS — D500 Iron deficiency anemia secondary to blood loss (chronic): Secondary | ICD-10-CM

## 2020-07-17 DIAGNOSIS — R195 Other fecal abnormalities: Secondary | ICD-10-CM

## 2020-07-17 DIAGNOSIS — K529 Noninfective gastroenteritis and colitis, unspecified: Secondary | ICD-10-CM | POA: Diagnosis not present

## 2020-07-17 MED ORDER — PLENVU 140 G PO SOLR: 140.0000 g | ORAL | 0 refills | Status: DC

## 2020-07-17 NOTE — Patient Instructions (Signed)
If you are age 85 or older, your body mass index should be between 23-30. Your Body mass index is 20.36 kg/m. If this is out of the aforementioned range listed, please consider follow up with your Primary Care Provider.  If you are age 59 or younger, your body mass index should be between 19-25. Your Body mass index is 20.36 kg/m. If this is out of the aformentioned range listed, please consider follow up with your Primary Care Provider.   You have been scheduled for an endoscopy and colonoscopy. Please follow the written instructions given to you at your visit today. Please pick up your prep supplies at the pharmacy within the next 1-3 days. If you use inhalers (even only as needed), please bring them with you on the day of your procedure.   It was a pleasure to see you today!  Thank you for trusting me with your gastrointestinal care!

## 2020-07-17 NOTE — Progress Notes (Signed)
[facility] Consult Note:  History: Alec Weiss 07/17/2020  Referring provider: [name], MD  Reason for consult/chief complaint: Anemia   Subjective  HPI: This is a very pleasant 87 year old man accompanied by his daughter and referred by his geriatric provider for IDA. This patient was in the emergency department on April 24 with dizziness and falls, MRI was done.  Hemoglobin noted to have decreased from 10.2 on March 17 8.4 that day, and stool was heme positive. Repeat hemoglobin check 8.1 on May 5, still 8.1 on May 11 when he was evaluated by [name] of hematology. IV iron planned this week and next week.Alec Weiss reports a change in bowel habits beginning sometime last year, where his stools are mostly "muddy" and sometimes loose but there is no visible bloody or black stool.  Sometimes he has nocturnal diarrhea and wonders if it is something he is eating.  His daughter has noted that he frequently coughs after eating.  I cannot seem to get a clear answer from him on whether or not he has dysphagia, but it seems as if it may occur occasionally.  His appetite is reportedly good, but they both reported his weight is down about 10 pounds in the last couple of months without attempting to do so He has had no prior colonoscopy to his recollection and does not think he had any reason for prior upper endoscopy either. ROS:  Review of Systems  Constitutional: Negative for appetite change and unexpected weight change.  HENT: Negative for mouth sores and voice change.   Eyes: Negative for pain and redness.  Respiratory: Negative for cough and shortness of breath.   Cardiovascular: Negative for chest pain and palpitations.  Genitourinary: Negative for dysuria and hematuria.  Musculoskeletal: Positive for arthralgias. Negative for myalgias.  Skin: Negative for pallor and rash.  Neurological: Negative for weakness and headaches.  Hematological: Negative for  adenopathy.     Past Medical History: Past Medical History:  Diagnosis Date  . Actinic keratosis 04/05/2012  . Insomnia, unspecified 06/08/2008  . Lumbago 01/02/2003  . 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 Pain in limb 02/05/2011  . Retinal detachment 2000   left eye  . Screening cholesterol level   . Shortness of breath 09/07/2008  . Vitamin D deficiency 08/04/2012     Past Surgical History: Past Surgical History:  Procedure Laterality Date  . EXTERNAL EAR SURGERY  05/2012   left ear   . INGUINAL HERNIA REPAIR Right 01/21/2005   Dr. [name]  . LID LESION EXCISION  2009  . MOHS SURGERY Left 05/28/2012   ear lobe Dr. [name]  . RETINAL DETACHMENT SURGERY Left [facility]     Family History: Family History  Problem Relation Age of Onset  . Cancer Mother        breast    Social History: Social History   Socioeconomic History  . Marital status: Widowed    Spouse name: Not on file  . Number of children: Not on file  . Years of education: Not on file  . Highest education level: Not on file  Occupational History  . Occupation: retired Hydrologist professor  Tobacco Use  . Smoking status: Never Smoker  . Smokeless tobacco: Never Used  Substance and Sexual Activity  . Alcohol use: Yes    Alcohol/week: 1.0 standard drink    Types: 1 Glasses of wine per week    Comment: at dinner every  night.  . Drug use: No  . Sexual activity: Not on file  Other Topics Concern  . Not on file  Social History Narrative   Patient is  Married Alec Weiss) since 1958--widowed a few years ago (updating 2021). Retired professor, Hydrologist. Lives in apartment, Independent Living section at [facility] since 02/2012.       No Smoking history, drinks moderate amount of alcohol    Patient has Advanced planning documents: DNR   Exercise walking - daily ~1 1/2 - 2 miles (~ 1hr daily) as well as [employer] Exercise classes.   Social Determinants of Health   Financial  Resource Strain: Not on file  Food Insecurity: Not on file  Transportation Needs: Not on file  Physical Activity: Not on file  Stress: Not on file  Social Connections: Not on file    Allergies: Allergies  Allergen Reactions  . Benzalkonium Chloride Rash    Very allergic per patient   . Maxitrol [Neomycin-Polymyxin-Dexameth] Itching and Rash  . Neosporin [Neomycin-Bacitracin Zn-Polymyx] Itching and Rash    Outpatient Meds: Current Outpatient Medications  Medication Sig Dispense Refill  . clotrimazole (LOTRIMIN) 1 % cream Apply 1 application topically 2 (two) times daily. To left knee for two dry scaly areas until resolved 40 g 0  . esomeprazole (NEXIUM) 40 MG capsule Take 1 capsule (40 mg total) by mouth daily. 30 capsule 3  . iron polysaccharides (NU-IRON) 150 MG capsule Take 1 capsule (150 mg total) by mouth daily. 30 capsule 3  . PEG-KCl-NaCl-NaSulf-Na Asc-C (PLENVU) 140 g SOLR Take 140 g by mouth as directed. 1 each 0  . vitamin B-12 (CYANOCOBALAMIN) 1000 MCG tablet Take 1 tablet (1,000 mcg total) by mouth daily. 90 tablet 1  . Vitamin D, Ergocalciferol, (DRISDOL) 1.25 MG (50000 UNIT) CAPS capsule TAKE ONE CAPSULE ONCE A WEEK FOR VITAMIN D SUPPLEMENT. 4 capsule 1   No current facility-administered medications for this visit.    nexium started in ED  ___________________________________________________________________ Objective   Exam:  BP (!) 100/58   Pulse 81   Ht 5\' 11"  (1.803 m)   Wt 146 lb (66.2 kg)   SpO2 96%   BMI 20.36 kg/m  Wt Readings from Last 3 Encounters:  07/17/20 146 lb (66.2 kg)  07/16/20 149 lb (67.6 kg)  07/11/20 150 lb 4.8 oz (68.2 kg)     General: Pleasant elderly man, conversational.  Ambulatory with a walker, gets on exam table slowly but without assistance.  Daughter present for entire visit.  Thin, no temporal wasting  Eyes: sclera anicteric, no redness  ENT: oral mucosa moist without lesions, no cervical or supraclavicular  lymphadenopathy  CV: RRR with 4 out of 6 systolic murmur, F8/B0, no JVD, no peripheral edema.  Venous stasis changes bilaterally  Resp: clear to auscultation bilaterally, normal RR and effort noted  GI: soft, no tenderness, with active bowel sounds. No guarding or palpable organomegaly noted.  Skin; warm and dry, no rash or jaundice noted.  Several ecchymoses  Neuro: awake, alert and oriented x 3. Normal gross motor function and fluent speech  Labs:  CBC Latest Ref Rng & Units 07/11/2020 07/11/2020 07/05/2020  WBC 4.0 - 10.5 K/uL 5.7 - 6.5  Hemoglobin 13.0 - 17.0 g/dL 8.1(L) - 8.1(A)  Hematocrit 37.5 - 51.0 % 25.7(L) 27.1(L) 26(A)  Platelets 150 - 400 K/uL 407(H) - 400(A)   Iron/TIBC/Ferritin/ %Sat  On 07/11/2020: Iron 19, TIBC 410, 5% saturation, ferritin 9     folate normal  On  01/05/2020 Iron 40, ferritin 9.3  SPEP shows increased M spike with kappa light chains  Radiologic Studies:  Nov 2018 echo:  nml LVEF, mild AS   Assessment: Encounter Diagnoses  Name Primary?  . Iron deficiency anemia due to chronic blood loss Yes  . Heme positive stool   . Chronic diarrhea    IDA diagnosed about 6 months ago, recently noted heme positive stool and worsening anemia.  He has also had chronic diarrhea since sometime last year, cause unclear.  He is on very few medicines so does not seem to be a side effect.  Does not sound infectious.  Endoscopic work-up indicated.  I had a long discussion about that with him and his daughter about that.  While risks are certainly increased given his age, anemia and aortic stenosis, I believe the benefits outweigh the risks in his case. That said, they should be done in the outpatient hospital-based endoscopy lab.  He will have an IV iron treatment this week and again next, that we will give some more time for his hemoglobin to increase, which should give him more energy, decrease the chance of dizziness and falls and make his endoscopic  procedure/sedation safer.   The benefits and risks of the planned procedure were described in detail with the patient or (when appropriate) their health care proxy.  Risks were outlined as including, but not limited to, bleeding, infection, perforation, adverse medication reaction leading to cardiac or pulmonary decompensation, pancreatitis (if ERCP).  The limitation of incomplete mucosal visualization was also discussed.  No guarantees or warranties were given.  His daughter is aware that he will need assistance with the bowel preparation at the [facility] facility.  Continue Nexium for now in case he has an upper digestive source of blood loss such as peptic ulcer.  Lastly, he has an appointment with neurology at their next available visit months or now.  If his dizziness persists despite improvement in hemoglobin, consider repeat echocardiogram to see if his AAS has worsened.  Thank you for the courtesy of this consult.  Please call me with any questions or concerns.  [name]  CC: Referring provider noted above

## 2020-07-20 ENCOUNTER — Inpatient Hospital Stay: Payer: Medicare PPO

## 2020-07-20 ENCOUNTER — Other Ambulatory Visit: Payer: Self-pay

## 2020-07-20 VITALS — BP 131/84 | HR 71 | Temp 98.0°F | Resp 18

## 2020-07-20 DIAGNOSIS — D509 Iron deficiency anemia, unspecified: Secondary | ICD-10-CM

## 2020-07-20 MED ORDER — SODIUM CHLORIDE 0.9 % IV SOLN
200.0000 mg | Freq: Once | INTRAVENOUS | Status: AC
  Administered 2020-07-20: 200 mg via INTRAVENOUS
  Filled 2020-07-20: qty 10

## 2020-07-20 MED ORDER — SODIUM CHLORIDE 0.9 % IV SOLN
Freq: Once | INTRAVENOUS | Status: AC
  Filled 2020-07-20: qty 250

## 2020-07-20 NOTE — Patient Instructions (Signed)

## 2020-07-27 ENCOUNTER — Telehealth: Payer: Self-pay

## 2020-07-27 ENCOUNTER — Inpatient Hospital Stay: Payer: Medicare PPO

## 2020-07-27 ENCOUNTER — Other Ambulatory Visit: Payer: Self-pay

## 2020-07-27 VITALS — BP 142/71 | HR 64 | Temp 97.9°F | Resp 18

## 2020-07-27 DIAGNOSIS — D509 Iron deficiency anemia, unspecified: Secondary | ICD-10-CM | POA: Diagnosis not present

## 2020-07-27 MED ORDER — SODIUM CHLORIDE 0.9 % IV SOLN
200.0000 mg | Freq: Once | INTRAVENOUS | Status: AC
  Administered 2020-07-27: 200 mg via INTRAVENOUS
  Filled 2020-07-27: qty 10

## 2020-07-27 MED ORDER — SODIUM CHLORIDE 0.9 % IV SOLN
Freq: Once | INTRAVENOUS | Status: AC
  Filled 2020-07-27: qty 250

## 2020-07-27 MED ORDER — NA SULFATE-K SULFATE-MG SULF 17.5-3.13-1.6 GM/177ML PO SOLN: 1.0000 | ORAL | 0 refills | Status: DC

## 2020-07-27 NOTE — Patient Instructions (Signed)

## 2020-07-27 NOTE — Telephone Encounter (Signed)
Noted  

## 2020-07-27 NOTE — Telephone Encounter (Signed)
Pt's daughter [name] called to inform that they want to keep 6/23 procedure. She also stated that prep is not cover by insurance. Pls call her [phone].

## 2020-07-27 NOTE — Telephone Encounter (Signed)
Spoke with [name], she states that she would like to keep patient's procedures as scheduled on 08/23/20. Advised that I can send in an alternative prep for the patient and will mail new instructions. Christine requested that the instructions be mailed to her home address which is [address] in [place], [place] [address]. [name] has been advised to let us know if she has not received the instructions in the mail prior to patient's procedures. Christine verbalized understanding of all information and had no concerns at the end of the call.

## 2020-07-27 NOTE — Telephone Encounter (Signed)
If we do not have a Plenvu or Suprep sample, then Golytely (split dose evening and AM) will likely be the least expensive out-of-pocket cost bowel prep.

## 2020-07-27 NOTE — Telephone Encounter (Signed)
Lm on Alec Weiss's vm for her to return call to discuss moving patient's procedures up to 08/02/20 at [facility].

## 2020-08-09 ENCOUNTER — Inpatient Hospital Stay: Payer: Medicare PPO | Attending: Hematology and Oncology | Admitting: Hematology and Oncology

## 2020-08-09 ENCOUNTER — Other Ambulatory Visit: Payer: Self-pay

## 2020-08-09 ENCOUNTER — Inpatient Hospital Stay: Payer: Medicare PPO

## 2020-08-09 ENCOUNTER — Encounter: Payer: Self-pay | Admitting: Hematology and Oncology

## 2020-08-09 VITALS — BP 127/66 | HR 76 | Temp 98.6°F | Ht 71.0 in | Wt 150.7 lb

## 2020-08-09 DIAGNOSIS — Z79899 Other long term (current) drug therapy: Secondary | ICD-10-CM | POA: Insufficient documentation

## 2020-08-09 DIAGNOSIS — D472 Monoclonal gammopathy: Secondary | ICD-10-CM | POA: Diagnosis present

## 2020-08-09 DIAGNOSIS — D500 Iron deficiency anemia secondary to blood loss (chronic): Secondary | ICD-10-CM | POA: Diagnosis not present

## 2020-08-09 DIAGNOSIS — D509 Iron deficiency anemia, unspecified: Secondary | ICD-10-CM | POA: Insufficient documentation

## 2020-08-09 DIAGNOSIS — D649 Anemia, unspecified: Secondary | ICD-10-CM

## 2020-08-09 NOTE — Assessment & Plan Note (Signed)
He is S/P injectafer recently We will do repeat labs when he comes for FU Proceed with endoscopy and colonoscopy as scheduled.

## 2020-08-09 NOTE — Assessment & Plan Note (Signed)
Ig M subtype Abnormal K/L ratio We have discussed about proceeding with UPEP, bone survey and BMB Discussed about BMB procedure. He would like to finish his endoscopy and colonoscopy and then schedule his BMB

## 2020-08-09 NOTE — Progress Notes (Signed)
[facility] NOTE  Patient Care Team: [name], MD as PCP - General (Internal Medicine) Community, [facility] [name], MD as Consulting Physician (Ophthalmology)  CHIEF COMPLAINTS/PURPOSE OF CONSULTATION:  Consult for anemia.  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 He would like to finish his endoscopy and colonoscopy and then schedule his BMB  Iron deficiency anemia He is S/P injectafer recently We will do repeat labs when he comes for FU Proceed with endoscopy and colonoscopy as scheduled.  Orders Placed This Encounter  Procedures   DG Bone Survey Met    Standing Status:   Future    Standing Expiration Date:   08/09/2021    Order Specific Question:   Reason for Exam (SYMPTOM  OR DIAGNOSIS REQUIRED)    Answer:   Ig M MGUS,    Order Specific Question:   Preferred imaging location?    Answer:   [facility]   CT BONE MARROW BIOPSY & ASPIRATION    Standing Status:   Future    Standing Expiration Date:   08/09/2021    Order Specific Question:   Reason for Exam (SYMPTOM  OR DIAGNOSIS REQUIRED)    Answer:   myeloma staging    Order Specific Question:   Preferred imaging location?    Answer:   [facility]    Order Specific Question:   Radiology Contrast Protocol - do NOT remove file path    Answer:   \\[email or web address]   CT BIOPSY    Standing Status:   Future    Standing Expiration Date:   08/09/2021    Order Specific Question:   Lab orders requested (DO NOT place separate lab orders, these will be automatically ordered during procedure specimen collection):    Answer:   Surgical Pathology    Order Specific Question:   Reason for Exam (SYMPTOM  OR DIAGNOSIS REQUIRED)    Answer:   bone marrow    Order Specific Question:    Preferred imaging location?    Answer:   [facility]    Order Specific Question:   Radiology Contrast Protocol - do NOT remove file path    Answer:   \\[email or web address]      HISTORY OF PRESENTING ILLNESS:   TEVAN MARIAN 87 y.o. male is here because of anemia.  Mr. Edmister is a very pleasant 87 year old male patient with past medical history significant for retinal detachment and eye surgery, chronic low back pain referred to hematology for evaluation and recommendations regarding his anemia.   During his initial visit, we discussed about IV iron infusion, GI evaluation and further evaluation of anemia.He is here for FU with his daughter, he says he is doing well He cant say he is feeling remarkably different after iron infusion, but he is feeling slightly better No recent episodes of vertigo or dizziness He is scheduled for endoscopy and colonoscopy for occult positive stool and severe IDA No other concerns for me today Rest of the pertinent 10 point ROS reviewed and negative.   MEDICAL HISTORY:  Past Medical History:  Diagnosis Date   Actinic keratosis 04/05/2012   Insomnia, unspecified 06/08/2008   Lumbago 01/02/2003   Neoplasm of uncertain behavior of skin 04/05/2012   Pain in limb 02/05/2011   Retinal detachment 2000   left eye   Screening  cholesterol level    Shortness of breath 09/07/2008   Vitamin D deficiency 08/04/2012    SURGICAL HISTORY: Past Surgical History:  Procedure Laterality Date   EXTERNAL EAR SURGERY  05/2012   left ear    INGUINAL HERNIA REPAIR Right 01/21/2005   Dr. [name]   LID LESION EXCISION  2009   MOHS SURGERY Left 05/28/2012   ear lobe Dr. [name]   RETINAL DETACHMENT SURGERY Left 1997   [facility]    SOCIAL HISTORY: Social History   Socioeconomic History   Marital status: Widowed    Spouse name: Not on file   Number of children: Not on file   Years of education: Not on file   Highest education level: Not on  file  Occupational History   Occupation: retired Hydrologist professor  Tobacco Use   Smoking status: Never   Smokeless tobacco: Never  Substance and Sexual Activity   Alcohol use: Yes    Alcohol/week: 1.0 standard drink    Types: 1 Glasses of wine per week    Comment: at dinner every night.   Drug use: No   Sexual activity: Not on file  Other Topics Concern   Not on file  Social History Narrative   Patient is  Married [name]) since 1958--widowed a few years ago (updating 2021). Retired professor, Hydrologist. Lives in apartment, Independent Living section at [facility] since 02/2012.       No Smoking history, drinks moderate amount of alcohol    Patient has Advanced planning documents: DNR   Exercise walking - daily ~1 1/2 - 2 miles (~ 1hr daily) as well as [employer] Exercise classes.   Social Determinants of Health   Financial Resource Strain: Not on file  Food Insecurity: Not on file  Transportation Needs: Not on file  Physical Activity: Not on file  Stress: Not on file  Social Connections: Not on file  Intimate Partner Violence: Not on file    FAMILY HISTORY: Family History  Problem Relation Age of Onset   Cancer Mother        breast    ALLERGIES:  is allergic to benzalkonium chloride, maxitrol [neomycin-polymyxin-dexameth], and neosporin [neomycin-bacitracin zn-polymyx].  MEDICATIONS:  Current Outpatient Medications  Medication Sig Dispense Refill   clotrimazole (LOTRIMIN) 1 % cream Apply 1 application topically 2 (two) times daily. To left knee for two dry scaly areas until resolved 40 g 0   esomeprazole (NEXIUM) 40 MG capsule Take 1 capsule (40 mg total) by mouth daily. 30 capsule 3   iron polysaccharides (NU-IRON) 150 MG capsule Take 1 capsule (150 mg total) by mouth daily. 30 capsule 3   Na Sulfate-K Sulfate-Mg Sulf 17.5-3.13-1.6 GM/177ML SOLN Take 1 kit by mouth as directed. 354 mL 0   vitamin B-12 (CYANOCOBALAMIN) 1000 MCG tablet Take 1 tablet  (1,000 mcg total) by mouth daily. 90 tablet 1   Vitamin D, Ergocalciferol, (DRISDOL) 1.25 MG (50000 UNIT) CAPS capsule TAKE ONE CAPSULE ONCE A WEEK FOR VITAMIN D SUPPLEMENT. 4 capsule 1   No current facility-administered medications for this visit.     PHYSICAL EXAMINATION: ECOG PERFORMANCE STATUS: 1 - Symptomatic but completely ambulatory  Vitals:   08/09/20 0943  BP: 127/66  Pulse: 76  Temp: 98.6 F (37 C)  SpO2: 100%   Filed Weights   08/09/20 0943  Weight: 150 lb 11.2 oz (68.4 kg)   PE deferred today in lieu of counseling.  LABORATORY DATA:  I have reviewed the data as listed Lab  Results  Component Value Date   WBC 5.7 07/11/2020   HGB 8.1 (L) 07/11/2020   HCT 27.1 (L) 07/11/2020   HCT 25.7 (L) 07/11/2020   MCV 81.8 07/11/2020   PLT 407 (H) 07/11/2020     Chemistry      Component Value Date/Time   NA 137 07/11/2020 1120   NA 132 (A) 07/05/2020 1528   K 4.2 07/11/2020 1120   CL 101 07/11/2020 1120   CO2 28 07/11/2020 1120   BUN 12 07/11/2020 1120   BUN 16 07/05/2020 1528   CREATININE 0.81 07/11/2020 1120   GLU 105 07/05/2020 1528      Component Value Date/Time   CALCIUM 9.5 07/11/2020 1120   ALKPHOS 86 07/11/2020 1120   AST 17 07/11/2020 1120   ALT 8 07/11/2020 1120   BILITOT 0.4 07/11/2020 1120       RADIOGRAPHIC STUDIES: I have personally reviewed the radiological images as listed and agreed with the findings in the report. No results found.   All questions were answered. The patient knows to call the clinic with any problems, questions or concerns. I spent 30 minutes in the care of the patient, we discussed all available results, Ig M MGUS, further evaluation of MGUS. We have discussed about MGUS pathogenesis, clinical manifestation when it transforms to MM, need for surveillance. He will RTC in mid July, he wants to first finish the scopes and then do the bone survey and BMB.    Benay Pike, MD 08/09/2020 3:01 PM

## 2020-08-10 ENCOUNTER — Telehealth: Payer: Self-pay

## 2020-08-10 NOTE — Telephone Encounter (Signed)
Nurse spoke with patient this morning to verify that he is aware of Bone Survey and BMB dates and time.  Patient acknowledged understanding.  No further questions or concerns at this time.  Patient knows to call clinic with any problems, questons or concerns.

## 2020-08-20 ENCOUNTER — Other Ambulatory Visit: Payer: Self-pay

## 2020-08-20 NOTE — Progress Notes (Signed)
Preop instructions for:  Alec Weiss    Date of Birth: 1927/02/26                      Date of Procedure:  08/23/2020 Procedure:   ESOPHAGOGASTRODUODENOSCOPY (EGD) WITH PROPOFOL. COLONOSCOPY WITH PROPOFOL   Surgeon: [place], [place] contact: [facility]     Phone:  [phone]               Health Care POA: RN contact name/phone#:   Alec Weiss, Clinical RN                       and Fax #: [phone]   Transportation contact phone#: [facility] accompanied by daughter Alec Weiss [phone]    Time to arrive at [facility]: [id number]   Report to: Admitting (On your left hand side)    Do not eat or drink past midnight the night before your procedure.(To include any tube feedings-must be discontinued)   Take these morning medications only with sips of water.(or give through gastrostomy or feeding tube). Nexium    Please send day of procedure:current med list and meds last taken that day, confirm nothing by mouth status from what time, Patient Demographic info( to include DNR status, problem list, allergies)   Bring Insurance card and picture ID Leave all jewelry and other valuables at place where living( no metal or rings to be worn) No contact lens  Men-no colognes,lotions   Any questions day of procedure,call  SHORT STAY-[phone]     Sent from :[facility]                   [phone]                   Fax:[phone]   Sent by :  Alec Weiss BSN ,RN

## 2020-08-23 ENCOUNTER — Ambulatory Visit (HOSPITAL_COMMUNITY): Payer: Medicare PPO | Admitting: Certified Registered Nurse Anesthetist

## 2020-08-23 ENCOUNTER — Encounter (HOSPITAL_COMMUNITY)
Admission: RE | Disposition: A | Discharge status: Nursing Home (Includes ICF) | Payer: Self-pay | Source: Ambulatory Visit | Attending: Gastroenterology

## 2020-08-23 ENCOUNTER — Encounter (HOSPITAL_COMMUNITY): Payer: Self-pay | Admitting: Gastroenterology

## 2020-08-23 ENCOUNTER — Ambulatory Visit (HOSPITAL_COMMUNITY)
Admission: RE | Admit: 2020-08-23 | Discharge: 2020-08-23 | Disposition: A | Discharge status: Nursing Home (Includes ICF) | Payer: Medicare PPO | Source: Ambulatory Visit | Attending: Gastroenterology | Admitting: Gastroenterology

## 2020-08-23 ENCOUNTER — Other Ambulatory Visit: Payer: Self-pay

## 2020-08-23 DIAGNOSIS — K6289 Other specified diseases of anus and rectum: Secondary | ICD-10-CM

## 2020-08-23 DIAGNOSIS — R195 Other fecal abnormalities: Secondary | ICD-10-CM | POA: Insufficient documentation

## 2020-08-23 DIAGNOSIS — Z888 Allergy status to other drugs, medicaments and biological substances status: Secondary | ICD-10-CM | POA: Insufficient documentation

## 2020-08-23 DIAGNOSIS — K449 Diaphragmatic hernia without obstruction or gangrene: Secondary | ICD-10-CM | POA: Insufficient documentation

## 2020-08-23 DIAGNOSIS — Z6820 Body mass index (BMI) 20.0-20.9, adult: Secondary | ICD-10-CM | POA: Diagnosis not present

## 2020-08-23 DIAGNOSIS — K5669 Other partial intestinal obstruction: Secondary | ICD-10-CM

## 2020-08-23 DIAGNOSIS — R1314 Dysphagia, pharyngoesophageal phase: Secondary | ICD-10-CM | POA: Diagnosis not present

## 2020-08-23 DIAGNOSIS — D500 Iron deficiency anemia secondary to blood loss (chronic): Secondary | ICD-10-CM | POA: Diagnosis not present

## 2020-08-23 DIAGNOSIS — D490 Neoplasm of unspecified behavior of digestive system: Secondary | ICD-10-CM

## 2020-08-23 DIAGNOSIS — Z881 Allergy status to other antibiotic agents status: Secondary | ICD-10-CM | POA: Insufficient documentation

## 2020-08-23 DIAGNOSIS — K529 Noninfective gastroenteritis and colitis, unspecified: Secondary | ICD-10-CM | POA: Insufficient documentation

## 2020-08-23 DIAGNOSIS — R634 Abnormal weight loss: Secondary | ICD-10-CM | POA: Diagnosis not present

## 2020-08-23 DIAGNOSIS — C20 Malignant neoplasm of rectum: Secondary | ICD-10-CM | POA: Insufficient documentation

## 2020-08-23 HISTORY — PX: COLONOSCOPY WITH PROPOFOL: SHX5780

## 2020-08-23 HISTORY — PX: BIOPSY: SHX5522

## 2020-08-23 HISTORY — PX: SUBMUCOSAL INJECTION: SHX5543

## 2020-08-23 HISTORY — PX: ESOPHAGOGASTRODUODENOSCOPY (EGD) WITH PROPOFOL: SHX5813

## 2020-08-23 LAB — CBC
HCT: 27.9 % — ABNORMAL LOW (ref 39.0–52.0)
Hemoglobin: 8.6 g/dL — ABNORMAL LOW (ref 13.0–17.0)
MCH: 25.1 pg — ABNORMAL LOW (ref 26.0–34.0)
MCHC: 30.8 g/dL (ref 30.0–36.0)
MCV: 81.6 fL (ref 80.0–100.0)
Platelets: 353 10*3/uL (ref 150–400)
RBC: 3.42 MIL/uL — ABNORMAL LOW (ref 4.22–5.81)
RDW: 17.2 % — ABNORMAL HIGH (ref 11.5–15.5)
WBC: 6.6 10*3/uL (ref 4.0–10.5)
nRBC: 0 % (ref 0.0–0.2)

## 2020-08-23 LAB — IRON AND TIBC
Iron: 38 ug/dL — ABNORMAL LOW (ref 45–182)
Saturation Ratios: 9 % — ABNORMAL LOW (ref 17.9–39.5)
TIBC: 427 ug/dL (ref 250–450)
UIBC: 389 ug/dL

## 2020-08-23 LAB — FERRITIN: Ferritin: 11 ng/mL — ABNORMAL LOW (ref 24–336)

## 2020-08-23 SURGERY — ESOPHAGOGASTRODUODENOSCOPY (EGD) WITH PROPOFOL
Anesthesia: Monitor Anesthesia Care

## 2020-08-23 MED ORDER — SODIUM CHLORIDE 0.9 % IV SOLN: INTRAVENOUS | Status: DC

## 2020-08-23 MED ORDER — PROPOFOL 500 MG/50ML IV EMUL
INTRAVENOUS | Status: DC | PRN
  Administered 2020-08-23: 100 ug/kg/min via INTRAVENOUS

## 2020-08-23 MED ORDER — PROPOFOL 1000 MG/100ML IV EMUL
INTRAVENOUS | Status: AC
  Filled 2020-08-23: qty 100

## 2020-08-23 MED ORDER — SPOT INK MARKER SYRINGE KIT
PACK | SUBMUCOSAL | Status: AC
  Filled 2020-08-23: qty 5

## 2020-08-23 MED ORDER — LACTATED RINGERS IV SOLN: INTRAVENOUS | Status: DC

## 2020-08-23 MED ORDER — SPOT INK MARKER SYRINGE KIT
PACK | SUBMUCOSAL | Status: DC | PRN
  Administered 2020-08-23: 1.5 mL via SUBMUCOSAL

## 2020-08-23 SURGICAL SUPPLY — 25 items

## 2020-08-23 NOTE — Discharge Instructions (Signed)
YOU HAD AN ENDOSCOPIC PROCEDURE TODAY: Refer to the procedure report and other information in the discharge instructions given to you for any specific questions about what was found during the examination. If this information does not answer your questions, please call [place] office at [phone] to clarify.  ° °YOU SHOULD EXPECT: Some feelings of bloating in the abdomen. Passage of more gas than usual. Walking can help get rid of the air that was put into your GI tract during the procedure and reduce the bloating. If you had a lower endoscopy (such as a colonoscopy or flexible sigmoidoscopy) you may notice spotting of blood in your stool or on the toilet paper. Some abdominal soreness may be present for a day or two, also. ° °DIET: Your first meal following the procedure should be a light meal and then it is ok to progress to your normal diet. A half-sandwich or bowl of soup is an example of a good first meal. Heavy or fried foods are harder to digest and may make you feel nauseous or bloated. Drink plenty of fluids but you should avoid alcoholic beverages for 24 hours. If you had a esophageal dilation, please see attached instructions for diet.   ° °ACTIVITY: Your care partner should take you home directly after the procedure. You should plan to take it easy, moving slowly for the rest of the day. You can resume normal activity the day after the procedure however YOU SHOULD NOT DRIVE, use power tools, machinery or perform tasks that involve climbing or major physical exertion for 24 hours (because of the sedation medicines used during the test).  ° °SYMPTOMS TO REPORT IMMEDIATELY: °A gastroenterologist can be reached at any hour. Please call [phone]  for any of the following symptoms:  °Following lower endoscopy (colonoscopy, flexible sigmoidoscopy) °Excessive amounts of blood in the stool  °Significant tenderness, worsening of abdominal pains  °Swelling of the abdomen that is new, acute  °Fever of 100° or  higher  °Following upper endoscopy (EGD, EUS, ERCP, esophageal dilation) °Vomiting of blood or coffee ground material  °New, significant abdominal pain  °New, significant chest pain or pain under the shoulder blades  °Painful or persistently difficult swallowing  °New shortness of breath  °Black, tarry-looking or red, bloody stools ° °FOLLOW UP:  °If any biopsies were taken you will be contacted by phone or by letter within the next 1-3 weeks. Call [phone]  if you have not heard about the biopsies in 3 weeks.  °Please also call with any specific questions about appointments or follow up tests. ° °

## 2020-08-23 NOTE — Transfer of Care (Signed)
Immediate Anesthesia Transfer of Care Note  Patient: Alec Weiss  Procedure(s) Performed: ESOPHAGOGASTRODUODENOSCOPY (EGD) WITH PROPOFOL COLONOSCOPY WITH PROPOFOL BIOPSY SUBMUCOSAL INJECTION  Patient Location: PACU  Anesthesia Type:MAC  Level of Consciousness: sedated, patient cooperative and responds to stimulation  Airway & Oxygen Therapy: Patient Spontanous Breathing and Patient connected to face mask oxygen  Post-op Assessment: Report given to RN and Post -op Vital signs reviewed and stable  Post vital signs: Reviewed and stable  Last Vitals:  Vitals Value Taken Time  BP 116/58 08/23/20 1025  Temp    Pulse    Resp 10 08/23/20 1026  SpO2    Vitals shown include unvalidated device data.  Last Pain:  Vitals:   08/23/20 0924  TempSrc: Oral  PainSc: 0-No pain         Complications: No notable events documented.

## 2020-08-23 NOTE — Op Note (Signed)
[facility] Patient Name: Alec Weiss Procedure Date: 08/23/2020 MRN: [id number] Attending MD: Estill Cotta. Danis , MD Date of Birth: June 14, 1926 CSN: [id number] Age: 87 Admit Type: Outpatient Procedure:                Colonoscopy Indications:              Heme positive stool, Iron deficiency anemia                            secondary to chronic blood loss, Chronic diarrhea                            ("muddy stool"), Weight loss Providers:                Mallie Mussel L. Loletha Carrow, MD, Jerene Pitch Person, Tyna Jaksch                            Technician Referring MD:             Rene Kocher. Lyndel Safe Medicines:                Monitored Anesthesia Care Complications:            No immediate complications. Estimated Blood Loss:     Estimated blood loss was minimal. Procedure:                Pre-Anesthesia Assessment:                           - Prior to the procedure, a History and Physical                            was performed, and patient medications and                            allergies were reviewed. The patient's tolerance of                            previous anesthesia was also reviewed. The risks                            and benefits of the procedure and the sedation                            options and risks were discussed with the patient.                            All questions were answered, and informed consent                            was obtained. Prior Anticoagulants: The patient has                            taken no previous anticoagulant or antiplatelet  agents. ASA Grade Assessment: III - A patient with                            severe systemic disease. After reviewing the risks                            and benefits, the patient was deemed in                            satisfactory condition to undergo the procedure.                           After obtaining informed consent, the colonoscope                            was passed  under direct vision. Throughout the                            procedure, the patient's blood pressure, pulse, and                            oxygen saturations were monitored continuously. The                            CF-HQ190L ([id number]) Olympus colonoscope was                            introduced through the anus and advanced to                            approximately the hepatic flexure (possibly                            ascending colon, but could be determined because                            anatomic landmarks obscured by poor prep. The                            colonoscopy was performed with difficulty due to                            poor bowel prep and a redundant colon. The patient                            tolerated the procedure well. The quality of the                            bowel preparation was poor. The rectum and extent                            of insertion were photographed. Scope In: 9:46:35 AM Scope Out: 10:12:06 AM Scope Withdrawal Time: 0 hours 16 minutes 51 seconds  Total  Procedure Duration: 0 hours 25 minutes 31 seconds  Findings:      The digital rectal exam findings include palpable rectal mass.      A large amount of stool was found in the entire colon, mostly precluding       visualization. Could not improve with lavage, and limited scope       advancement.      A fungating, infiltrative and ulcerated partially obstructing mass was       found in the mid rectum. The mass was circumferential. The mass measured       about three cm in length (about 10-13cm from anal verge). Adult       colonoscope able to pass without resistance. Biopsies were taken with a       cold forceps for histology. (Jar 1) Area a few cm proximal to mass was       tattooed with an injection of 1 mL of Spot (carbon black).      A fungating, infiltrative and ulcerated partially obstructing mass was       found in the distal rectum. The mass was circumferential. The mass        measured about six to seven cm in length (involving proximal anus).       Scope able to pass without resistance. No bleeding was present. Biopsies       were taken with a cold forceps for histology.      - There were a few diminutive polyps between these masses. Not biopsied       or removed. Impression:               - Preparation of the colon was poor.                           - Palpable rectal mass found on digital rectal exam.                           - Stool in the entire examined colon.                           - Likely malignant partially obstructing tumor in                            the mid rectum. Biopsied. Tattooed.                           - Likely malignant partially obstructing tumor in                            the distal rectum. Biopsied. Moderate Sedation:      MAC sedation used Recommendation:           - Patient has a contact number available for                            emergencies. The signs and symptoms of potential                            delayed complications were discussed with the  patient. Return to normal activities tomorrow.                            Written discharge instructions were provided to the                            patient.                           - Resume previous diet.                           - Continue present medications.                           - Await pathology results. Return to hematologist -                            biopsy results will be conveyed to that physician                            to expedite further workup and management.                           - See the other procedure note for documentation of                            additional recommendations. Procedure Code(s):        --- Professional ---                           [phone], 23, Colonoscopy, flexible; with directed                            submucosal injection(s), any substance                           03474, 22,  Colonoscopy, flexible; with biopsy,                            single or multiple Diagnosis Code(s):        --- Professional ---                           K62.89, Other specified diseases of anus and rectum                           D49.0, Neoplasm of unspecified behavior of                            digestive system                           K56.690, Other partial intestinal obstruction                           R19.5, Other fecal abnormalities  D50.0, Iron deficiency anemia secondary to blood                            loss (chronic)                           K52.9, Noninfective gastroenteritis and colitis,                            unspecified                           R63.4, Abnormal weight loss CPT copyright 2019 American Medical Association. All rights reserved. The codes documented in this report are preliminary and upon coder review may  be revised to meet current compliance requirements. Stafford Riviera L. Loletha Carrow, MD 08/23/2020 10:40:09 AM This report has been signed electronically. Number of Addenda: 0

## 2020-08-23 NOTE — Op Note (Signed)
[facility] Patient Name: Alec Weiss Procedure Date: 08/23/2020 MRN: [id number] Attending MD: Estill Cotta. Loletha Carrow , MD Date of Birth: 1927/01/22 CSN: [id number] Age: 87 Admit Type: Outpatient Procedure:                Upper GI endoscopy Indications:              Iron deficiency anemia secondary to chronic blood                            loss, Esophageal dysphagia, Heme positive stool Providers:                Mallie Mussel L. Loletha Carrow, MD, Jerene Pitch Person, Tyna Jaksch                            Technician Referring MD:             Rene Kocher. Lyndel Safe Medicines:                Monitored Anesthesia Care Complications:            No immediate complications. Estimated Blood Loss:     Estimated blood loss: none. Procedure:                Pre-Anesthesia Assessment:                           - Prior to the procedure, a History and Physical                            was performed, and patient medications and                            allergies were reviewed. The patient's tolerance of                            previous anesthesia was also reviewed. The risks                            and benefits of the procedure and the sedation                            options and risks were discussed with the patient.                            All questions were answered, and informed consent                            was obtained. Prior Anticoagulants: The patient has                            taken no previous anticoagulant or antiplatelet                            agents. ASA Grade Assessment: III - A patient with  severe systemic disease. After reviewing the risks                            and benefits, the patient was deemed in                            satisfactory condition to undergo the procedure.                           - Prior to the procedure, a History and Physical                            was performed, and patient medications and                             allergies were reviewed. The patient's tolerance of                            previous anesthesia was also reviewed. The risks                            and benefits of the procedure and the sedation                            options and risks were discussed with the patient.                            All questions were answered, and informed consent                            was obtained. Prior Anticoagulants: The patient has                            taken no previous anticoagulant or antiplatelet                            agents. ASA Grade Assessment: III - A patient with                            severe systemic disease. After reviewing the risks                            and benefits, the patient was deemed in                            satisfactory condition to undergo the procedure.                           After obtaining informed consent, the endoscope was                            passed under direct vision. Throughout the  procedure, the patient's blood pressure, pulse, and                            oxygen saturations were monitored continuously. The                            [id number]-[id number] ([id number]) Olympus gastroscope was                            introduced through the mouth, and advanced to the                            second part of duodenum. The upper GI endoscopy was                            accomplished without difficulty. The patient                            tolerated the procedure well. Scope In: Scope Out: Findings:      An 8 cm hiatal hernia was present.      Circumferential salmon-colored mucosa was present from 30 to 35 cm. No       other visible abnormalities were present under WL or NBI. The maximum       longitudinal extent of these esophageal mucosal changes was 5 cm in       length. It was not biopsied (see colonoscopy report)      The stomach was normal.      The examined duodenum was normal. Impression:                - 8 cm hiatal hernia.                           - Salmon-colored mucosa consistent with                            long-segment Barrett's esophagus.                           - Normal stomach.                           - Normal examined duodenum.                           - No specimens collected. Moderate Sedation:      MAC sedation used Recommendation:           - Patient has a contact number available for                            emergencies. The signs and symptoms of potential                            delayed complications were discussed with the  patient. Return to normal activities tomorrow.                            Written discharge instructions were provided to the                            patient.                           - Resume previous diet.                           - Continue present medications.                           - See the other procedure note for documentation of                            additional recommendations. Procedure Code(s):        --- Professional ---                           [phone], Esophagogastroduodenoscopy, flexible,                            transoral; diagnostic, including collection of                            specimen(s) by brushing or washing, when performed                            (separate procedure) Diagnosis Code(s):        --- Professional ---                           K44.9, Diaphragmatic hernia without obstruction or                            gangrene                           K22.8, Other specified diseases of esophagus                           D50.0, Iron deficiency anemia secondary to blood                            loss (chronic)                           R13.14, Dysphagia, pharyngoesophageal phase                           R19.5, Other fecal abnormalities CPT copyright 2019 American Medical Association. All rights reserved. The codes documented in this report are preliminary and  upon coder review may  be revised to meet current compliance requirements. Jimena Wieczorek L. Loletha Carrow, MD 08/23/2020 10:43:51 AM This report has been signed electronically. Number of  Addenda: 0

## 2020-08-23 NOTE — Anesthesia Postprocedure Evaluation (Signed)
Anesthesia Post Note  Patient: Alec Weiss  Procedure(s) Performed: ESOPHAGOGASTRODUODENOSCOPY (EGD) WITH PROPOFOL COLONOSCOPY WITH PROPOFOL BIOPSY SUBMUCOSAL INJECTION     Patient location during evaluation: Endoscopy Anesthesia Type: MAC Level of consciousness: awake Pain management: pain level controlled Vital Signs Assessment: post-procedure vital signs reviewed and stable Respiratory status: spontaneous breathing, nonlabored ventilation, respiratory function stable and patient connected to nasal cannula oxygen Cardiovascular status: stable and blood pressure returned to baseline Postop Assessment: no apparent nausea or vomiting Anesthetic complications: no   No notable events documented.  Last Vitals:  Vitals:   08/23/20 1026 08/23/20 1036  BP: (!) 116/58 (!) 123/49  Pulse: (!) 58 67  Resp: 10 19  Temp: (!) 36 C   SpO2: 98% 98%    Last Pain:  Vitals:   08/23/20 1036  TempSrc:   PainSc: 0-No pain                 [name]

## 2020-08-23 NOTE — Anesthesia Preprocedure Evaluation (Addendum)
Anesthesia Evaluation  Patient identified by MRN, date of birth, ID band Patient awake    Reviewed: Allergy & Precautions, NPO status , Patient's Chart, lab work & pertinent test results  Airway Mallampati: III  TM Distance: >3 FB Neck ROM: Full    Dental no notable dental hx.    Pulmonary neg pulmonary ROS,    Pulmonary exam normal breath sounds clear to auscultation       Cardiovascular Normal cardiovascular exam+ Valvular Problems/Murmurs AS  Rhythm:Regular Rate:Normal  ECG: SR, rate 63   Neuro/Psych negative neurological ROS  negative psych ROS   GI/Hepatic negative GI ROS, Neg liver ROS,   Endo/Other  negative endocrine ROS  Renal/GU negative Renal ROS     Musculoskeletal negative musculoskeletal ROS (+)   Abdominal   Peds  Hematology negative hematology ROS (+)   Anesthesia Other Findings heme positive stool  Reproductive/Obstetrics                            Anesthesia Physical Anesthesia Plan  ASA: 2  Anesthesia Plan: MAC   Post-op Pain Management:    Induction: Intravenous  PONV Risk Score and Plan: 1 and Propofol infusion and Treatment may vary due to age or medical condition  Airway Management Planned: Nasal Cannula  Additional Equipment:   Intra-op Plan:   Post-operative Plan:   Informed Consent: I have reviewed the patients History and Physical, chart, labs and discussed the procedure including the risks, benefits and alternatives for the proposed anesthesia with the patient or authorized representative who has indicated his/her understanding and acceptance.     Dental advisory given  Plan Discussed with: CRNA  Anesthesia Plan Comments:         Anesthesia Quick Evaluation

## 2020-08-23 NOTE — Interval H&P Note (Signed)
History and Physical Interval Note:  08/23/2020 9:41 AM  Alec Weiss  has presented today for surgery, with the diagnosis of heme positive stool.  The various methods of treatment have been discussed with the patient and family. After consideration of risks, benefits and other options for treatment, the patient has consented to  Procedure(s): ESOPHAGOGASTRODUODENOSCOPY (EGD) WITH PROPOFOL (N/A) COLONOSCOPY WITH PROPOFOL (N/A) as a surgical intervention.  The patient's history has been reviewed, patient examined, no change in status, stable for surgery.  I have reviewed the patient's chart and labs.  Questions were answered to the patient's satisfaction.     [name]

## 2020-08-23 NOTE — H&P (Signed)
History:  This patient presents for endoscopic testing for iron deficiency anemia and heme positive stool, chronic diarrhea.  See 07/17/20 office note for clinical details He has since had IV iron. 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 [name]   LID LESION EXCISION  2009   MOHS SURGERY Left 05/28/2012   ear lobe Dr. [name]   RETINAL DETACHMENT SURGERY Left 1997   [facility]    Allergies: Allergies  Allergen Reactions   Benzalkonium Chloride Rash    Very allergic per patient    Maxitrol [Neomycin-Polymyxin-Dexameth] Itching and Rash   Neosporin [Neomycin-Bacitracin Zn-Polymyx] Itching and Rash    Outpatient Meds: Current Facility-Administered Medications  Medication Dose Route Frequency Provider Last Rate Last Admin   0.9 %  sodium chloride infusion   Intravenous Continuous Danis, Estill Cotta III, MD       lactated ringers infusion   Intravenous Continuous Danis, Estill Cotta III, MD          ___________________________________________________________________ Objective   Exam:  BP (!) 161/60   Pulse 67   Temp 97.7 F (36.5 C) (Oral)   Resp (!) 25   Ht 5\' 11"  (1.803 m)   Wt 68 kg   SpO2 100%   BMI 20.92 kg/m   CV: RRR without murmur, S1/S2, no JVD, no peripheral edema Resp: clear to auscultation bilaterally, normal RR and effort noted GI: soft, no  tenderness, with active bowel sounds. No guarding or palpable organomegaly noted. Neuro: awake, alert and oriented x 3. Normal gross motor function and fluent speech   Assessment:  IDA, heme positive stool, chronic diarrhea  Plan:  EGD and colonoscopy   [name]

## 2020-08-24 ENCOUNTER — Other Ambulatory Visit: Payer: Self-pay | Admitting: Hematology and Oncology

## 2020-08-24 ENCOUNTER — Telehealth: Payer: Self-pay

## 2020-08-24 ENCOUNTER — Other Ambulatory Visit: Payer: Self-pay

## 2020-08-24 DIAGNOSIS — C20 Malignant neoplasm of rectum: Secondary | ICD-10-CM

## 2020-08-24 NOTE — Telephone Encounter (Signed)
Received a return call from [place] at [facility]. She states that the soonest they can move the patient's appt to is 09/06/20 at 2:40 pm. [name] will be out of the office next week. I was told that they do have NP's or PA's but they would have to review patient's information. I asked if they had a Doctor on call that can take a look at patient's chart and advise on follow up and imaging. I was told that Dr. Marin Olp is the on-call provider for today. Will route message for his review.

## 2020-08-24 NOTE — Progress Notes (Unsigned)
Talked to the patient. I discussed with him about the diagnosis. He would like to proceed with staging scans. Staging scans ordered and NN engaged.  Lolitha Tortora

## 2020-08-24 NOTE — Progress Notes (Signed)
I called the daughter, Daughter is yet to discuss with the patient about the diagnosis. She will talk to him about it, her guess is he may not choose to have anything done. She was instructed to let us know if he decides to do any thing about this cancer.  Mitzi Lilja

## 2020-08-24 NOTE — Telephone Encounter (Addendum)
Please clarify with that office staff that this is not follow-up for his chronic anemia for which [name] was planning a possible bone marrow biopsy.  This is a new diagnosis of rectal cancer made yesterday at the time of colonoscopy.  They need to pass that message to [name] or a covering physician to arrange sooner follow-up and appropriate staging imaging tests in the meantime.  - HD

## 2020-08-24 NOTE — Telephone Encounter (Signed)
Thank you for the note, I have reviewed the report.  I spoke with his daughter [name] by phone just now to give her the news, which was expected after our conversation yesterday.  I did forward Mr. Seats chart to his hematologist, Dr.Iruku, after yesterday's procedure, and I will copy them on this note as well.  In case that physician is out of the office this week, please contact the hematology and oncology clinic to expedite office follow-up for this patient with [name] or one of their colleagues for this newly diagnosed rectal cancer.  - H. Danis

## 2020-08-24 NOTE — Telephone Encounter (Signed)
Spoke with Joy at Dr. Rob Hickman office, she states that [name] is not at the [facility] location today but she will get a message sent to her for review so that she can further advise. I have asked that they give me a call once they have more information, they are aware that [name] is attached on this message already.

## 2020-08-24 NOTE — Telephone Encounter (Signed)
Dr. Loletha Carrow, I spoke with Dr. Rob Hickman office and patient is scheduled for some testing that she has ordered in July. The soonest appt is 09/12/20 at 2:20 pm. Patient's daughter is aware of appt change.

## 2020-08-24 NOTE — Telephone Encounter (Signed)
This nurse received a call from Dr Loletha Carrow office who requested this patients scheduled appointment with [name] be rescheduled to a sooner date if possible due to new diagnosis.  This nurse spoke with scheduling and moved appointment from 7/13 to 7/7 at 2:45 pm.  Agar, RN for Dr. Loletha Carrow.

## 2020-08-24 NOTE — Telephone Encounter (Signed)
Received a call from Dr. Tammi Klippel in pathology, both rectal masses returned as invasive adenocarcinoma.

## 2020-08-27 ENCOUNTER — Other Ambulatory Visit: Payer: Self-pay

## 2020-08-27 NOTE — Progress Notes (Signed)
Spoke with patient's daughter [name] regarding appointments for ordered CT scan and MRI.  She was given detailed information regarding arrival time and prep.  She knows to pick up the two bottle of oral prep which have been left out at the front desk of [facility].  She was also informed that the bone marrow biopsy and bone survey have been cancelled per [name].  She verbalized an understanding of all.

## 2020-08-29 ENCOUNTER — Encounter: Payer: Self-pay | Admitting: Internal Medicine

## 2020-08-30 ENCOUNTER — Ambulatory Visit (HOSPITAL_COMMUNITY): Payer: Medicare PPO

## 2020-09-04 ENCOUNTER — Telehealth: Payer: Self-pay

## 2020-09-04 NOTE — Telephone Encounter (Signed)
Called and spoke with patient regarding appt on 7/7. Patient agreeable to move time to 1240pm.

## 2020-09-05 ENCOUNTER — Ambulatory Visit (HOSPITAL_COMMUNITY)
Admission: RE | Admit: 2020-09-05 | Discharge: 2020-09-05 | Disposition: A | Discharge status: Home / Self Care | Payer: Medicare PPO | Source: Ambulatory Visit | Attending: Hematology and Oncology | Admitting: Hematology and Oncology

## 2020-09-05 ENCOUNTER — Encounter (HOSPITAL_COMMUNITY): Payer: Self-pay

## 2020-09-05 ENCOUNTER — Other Ambulatory Visit: Payer: Self-pay

## 2020-09-05 DIAGNOSIS — C20 Malignant neoplasm of rectum: Secondary | ICD-10-CM | POA: Insufficient documentation

## 2020-09-05 LAB — POCT I-STAT CREATININE: Creatinine, Ser: 0.7 mg/dL (ref 0.61–1.24)

## 2020-09-05 LAB — SURGICAL PATHOLOGY

## 2020-09-05 IMAGING — CT CT CHEST-ABD-PELV W/ CM
3 of 5 series · 14 of 36 positions shown, 16 images · IV contrast (APPLIED)
Comparison: CT abdomen pelvis, [DATE]

CLINICAL DATA: Rectal cancer staging

EXAM:
CT CHEST, ABDOMEN, AND PELVIS WITH CONTRAST
TECHNIQUE: Multidetector CT imaging of the chest, abdomen and pelvis was
performed following the standard protocol during bolus
administration of intravenous contrast.
CONTRAST:  100mL OMNIPAQUE IOHEXOL 300 MG/ML SOLN, additional oral
enteric contrast

[Series 2: cap with · axial · 0.80mm/px · z∈[-575,-65]mm · 9 of 128 slices shown, 11 images]
[im 13/128  mediastinal]
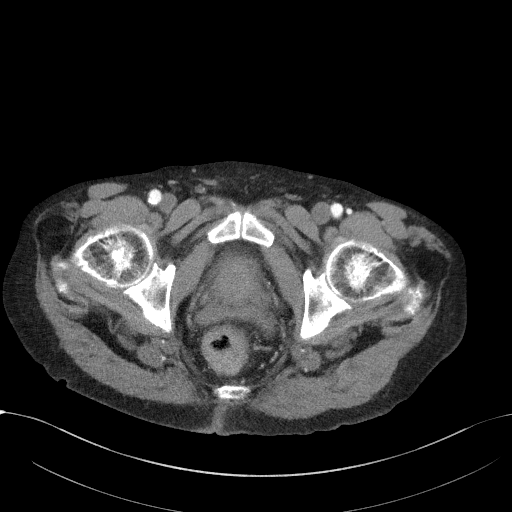
[im 13/128  bone]
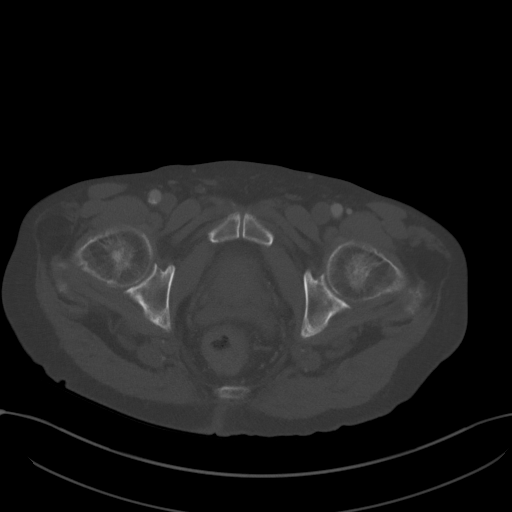
[im 26/128  mediastinal]
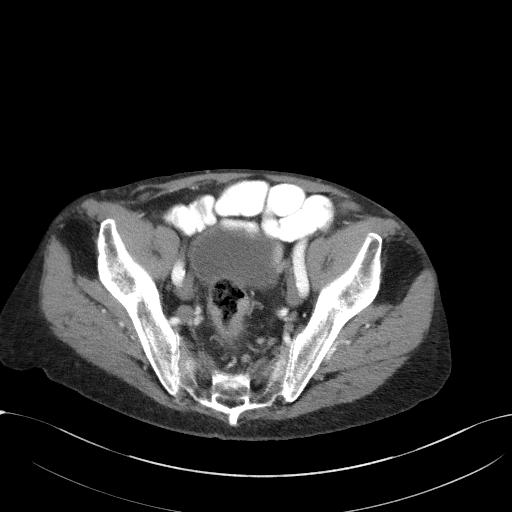
[im 39/128  mediastinal]
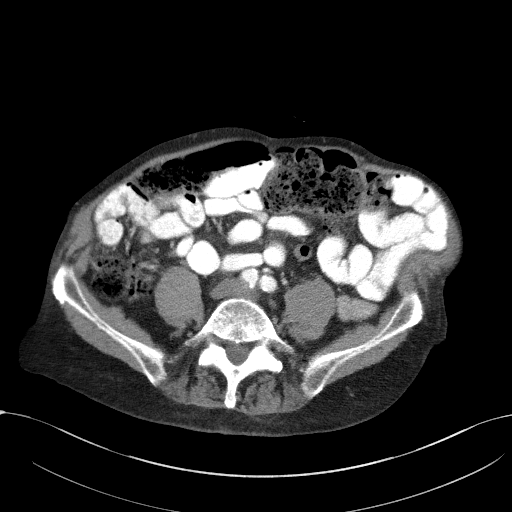
[im 51/128  mediastinal]
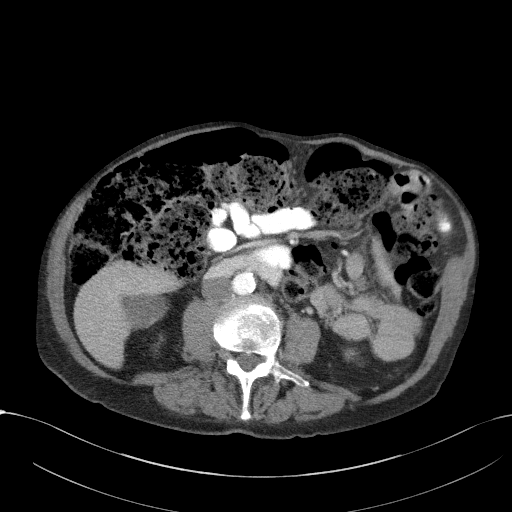
[im 64/128  mediastinal]
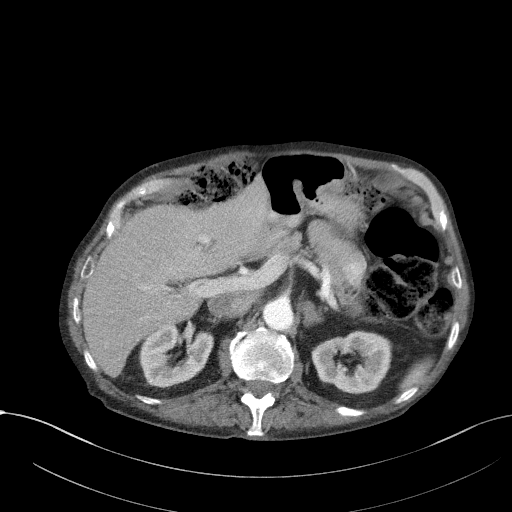
[im 77/128  mediastinal]
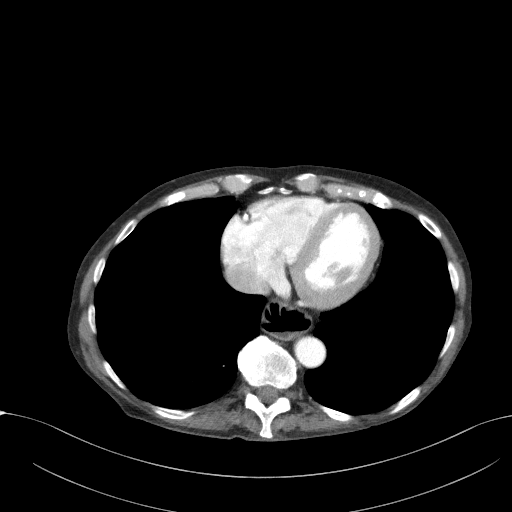
[im 89/128  mediastinal]
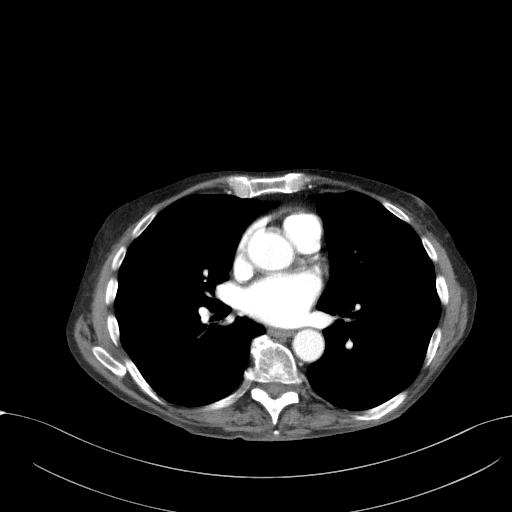
[im 102/128  mediastinal]
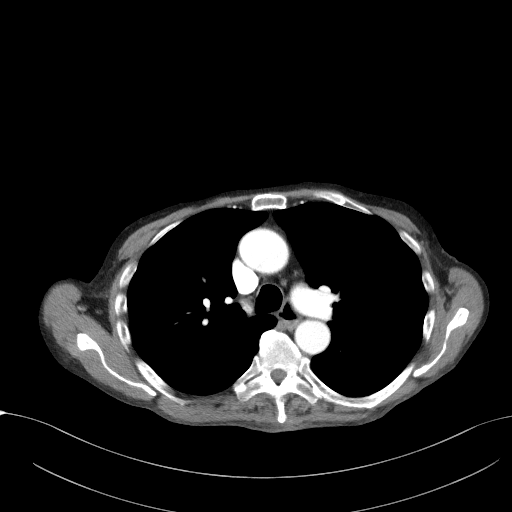
[im 115/128  mediastinal]
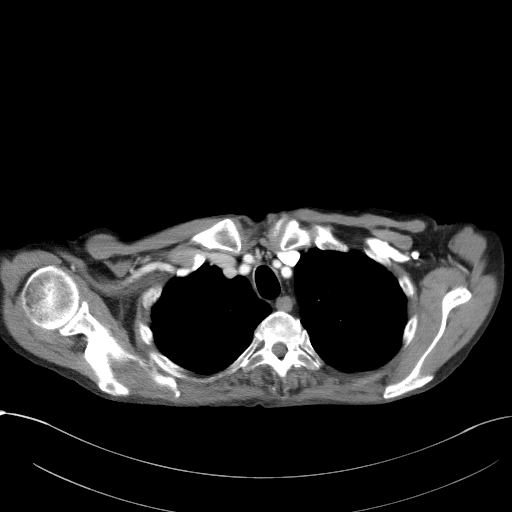
[im 115/128  bone]
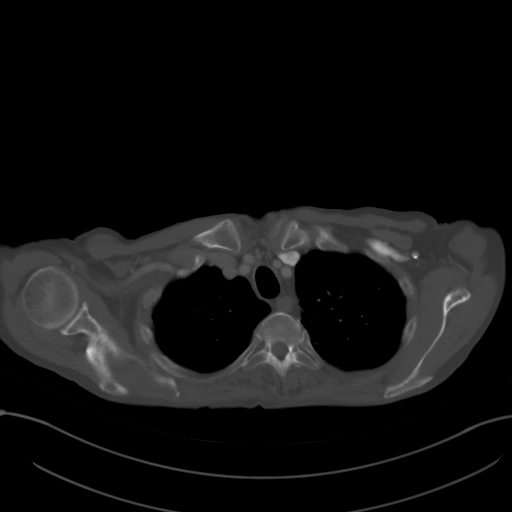

[Series 5: coronals · coronal · 0.78mm/px · 3 of 139 slices shown]
[im 28/139  mediastinal]
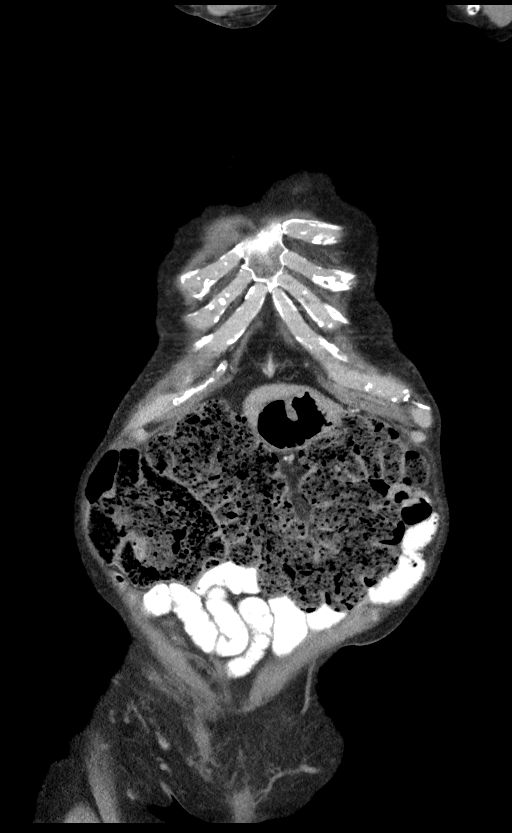
[im 56/139  mediastinal]
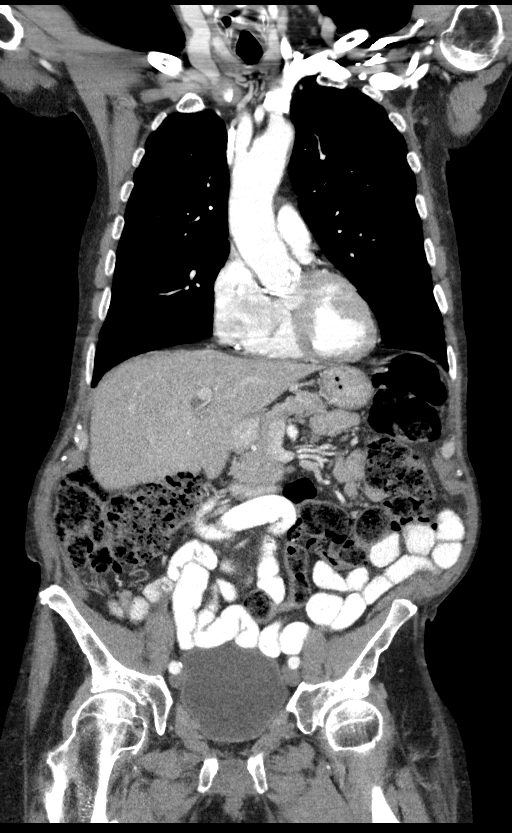
[im 83/139  mediastinal]
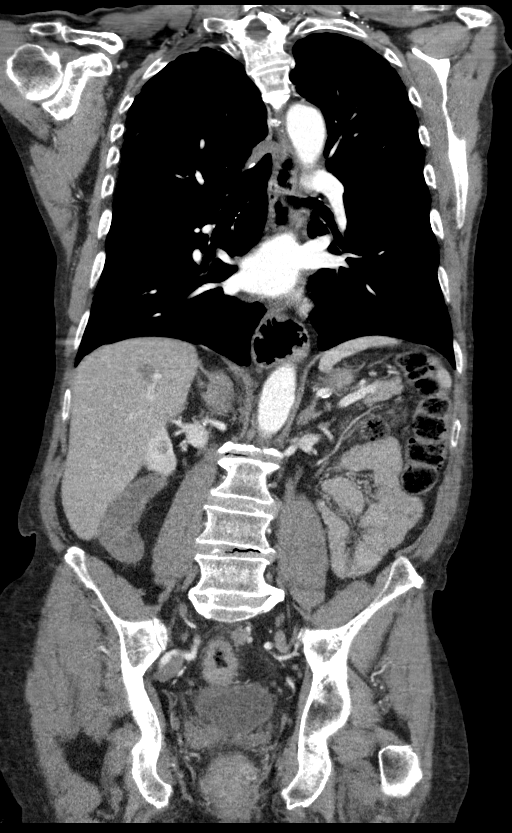

[Series 7: lung · axial · 0.80mm/px · z∈[-312,-260]mm · 2 of 169 slices shown]
[im 13/169  bone]
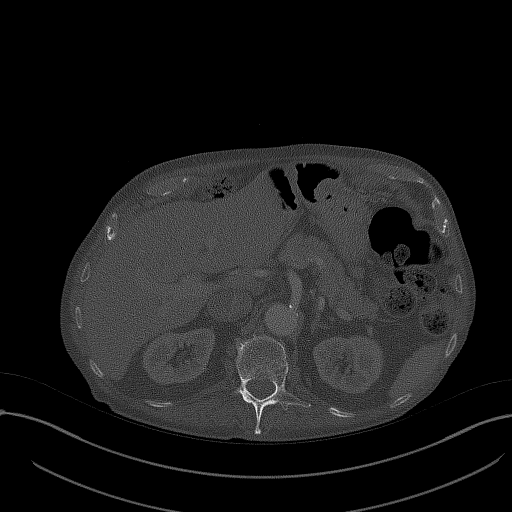
[im 39/169  bone]
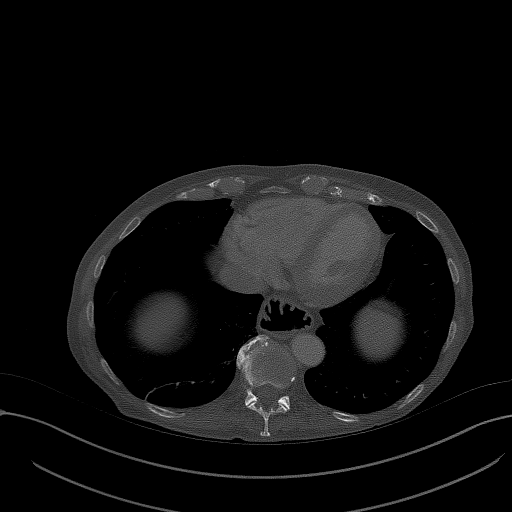

[14 of 36 positions shown; findings below may reference images not displayed]

FINDINGS: CT CHEST FINDINGS

Cardiovascular: Aortic atherosclerosis. Normal heart size. Left and
right coronary artery calcifications no pericardial effusion.

Mediastinum/Nodes: No enlarged mediastinal, hilar, or axillary lymph
nodes. Small hiatal hernia. Thyroid gland, trachea, and esophagus
demonstrate no significant findings.

Lungs/Pleura: Lungs are clear. No pleural effusion or pneumothorax.

Musculoskeletal: No chest wall mass or suspicious bone lesions
identified.

CT ABDOMEN PELVIS FINDINGS

Hepatobiliary: No solid liver abnormality is seen. Ill-defined
low-attenuation lesion of the right lobe of the liver is unchanged
compared to prior examination and incompletely characterized
although most likely a small hemangioma, measuring approximately
x 1.1 cm (series 2, image 58). No gallstones, gallbladder wall
thickening, or biliary dilatation.

Pancreas: Unremarkable. No pancreatic ductal dilatation or
surrounding inflammatory changes.

Spleen: Normal in size without significant abnormality.

Adrenals/Urinary Tract: Adrenal glands are unremarkable. Kidneys are
normal, without renal calculi, solid lesion, or hydronephrosis.
Bladder is unremarkable.

Stomach/Bowel: Stomach is within normal limits. Appendix is not
clearly visualized. Large burden of stool throughout the colon.
Descending and sigmoid diverticulosis. Circumferential thickening of
the low rectum, a segment approximately 6 cm in length to the anal
verge

Vascular/Lymphatic: Aortic atherosclerosis. Enlarged left pelvic
sidewall lymph node measuring up to 1.3 x 1.1 cm, not significantly
changed compared to prior examination (series 2, image 106).

Reproductive: Prostatomegaly.

Other: No abdominal wall hernia or abnormality. No abdominopelvic
ascites.

Musculoskeletal: No acute or significant osseous findings.
IMPRESSION: 1. Circumferential thickening of the low rectum, a segment
approximately 6 cm in length to the anal verge, consistent with
primary rectal malignancy. Please see forthcoming MR examination of
the pelvis for staging.
2. Enlarged left pelvic sidewall lymph node measuring up to 1.3 x
1.1 cm, not significantly changed compared to prior examination.
This is suspicious for nodal metastatic disease.
3. No evidence of distant metastatic disease in the chest, abdomen,
or pelvis.
4. Ill-defined low-attenuation lesion of the right lobe of the liver
is unchanged compared to prior examination and incompletely
characterized, although most likely a small hemangioma, measuring
approximately 1.8 x 1.1 cm. This could be further characterized by
contrast enhanced MR if desired.
5. Prostatomegaly.
6. Coronary artery disease.

Aortic Atherosclerosis ([6Y]-[6Y]).

## 2020-09-05 IMAGING — MR MR PELVIS W/O CM
7 series · 43 of 48 positions shown · non-contrast
Comparison: None.
COMPARISON: None.
COMPARISON: None.

Addendum:
CLINICAL DATA: Newly diagnosed rectal adenocarcinoma.

EXAM:
MRI PELVIS WITHOUT CONTRAST
TECHNIQUE: Multiplanar multisequence MR imaging of the pelvis was performed. No
intravenous contrast was administered. Ultrasound gel was
administered per rectum to optimize tumor evaluation.

[Series 2: T2 · sagittal · 3.0mm · 0.74mm/px · 6 of 46 slices shown (1 of 5)]
[im 1/46]
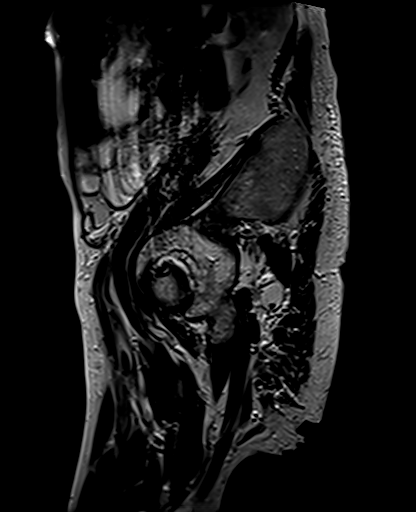
[im 10/46]
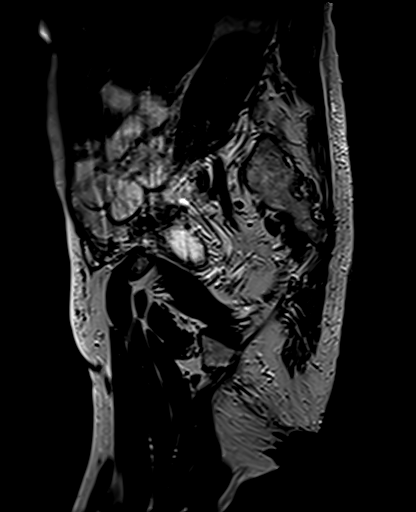
[im 19/46]
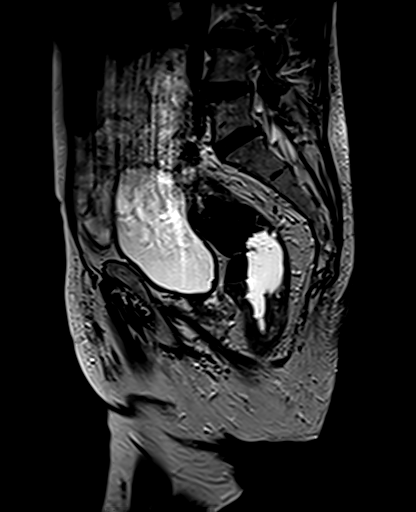
[im 28/46]
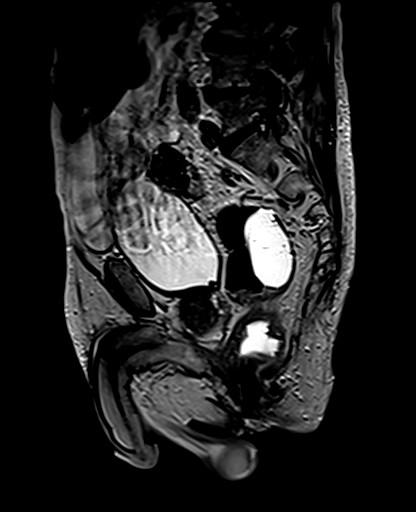
[im 37/46]
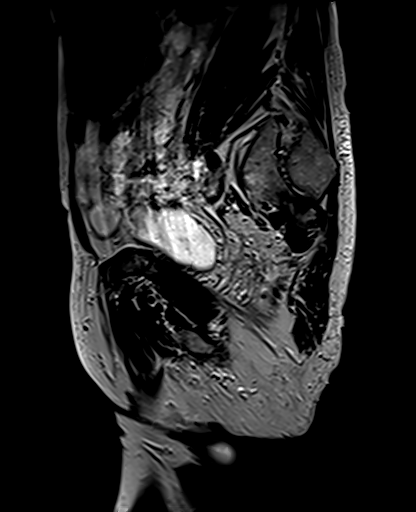
[im 46/46]
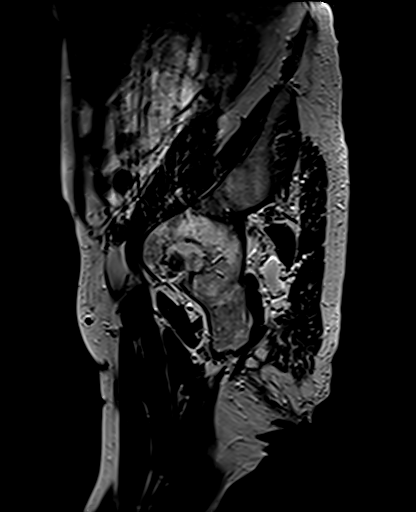

[Series 3: T2 · axial · 5.0mm · 0.94mm/px · z∈[-140,+124]mm · 6 of 45 slices shown (2 of 5)]
[im 1/45]
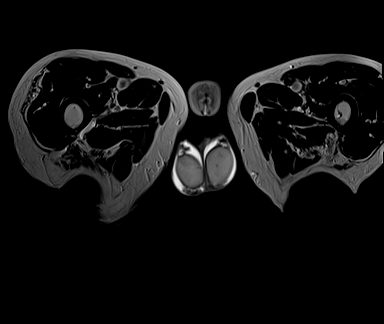
[im 9/45]
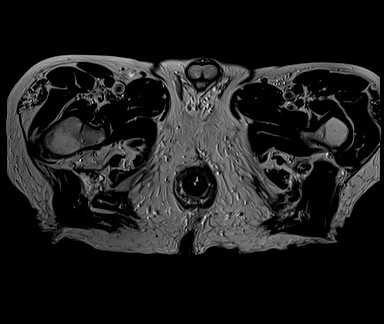
[im 18/45]
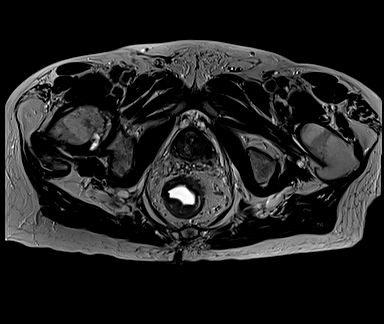
[im 27/45]
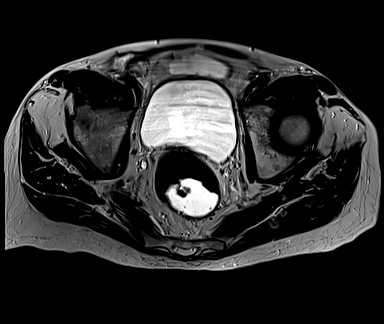
[im 36/45]
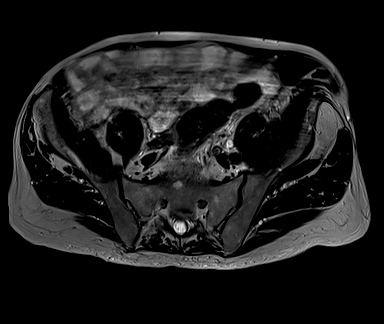
[im 45/45]
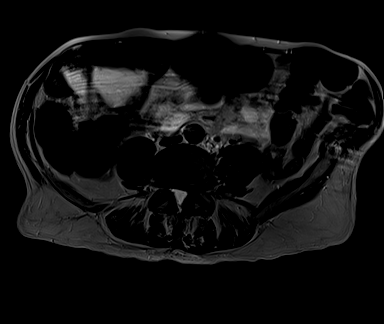

[Series 4: DWI · axial · 5.0mm · 1.48mm/px · z∈[-141,+111]mm · 11 of 86 slices shown (1 of 2)]
[im 1/86]
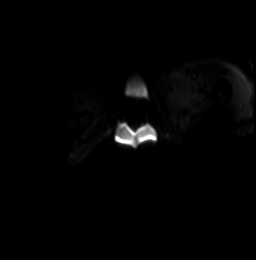
[im 9/86]
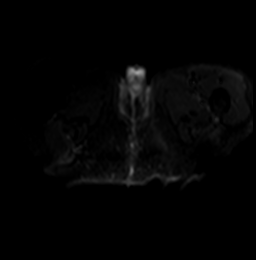
[im 18/86]
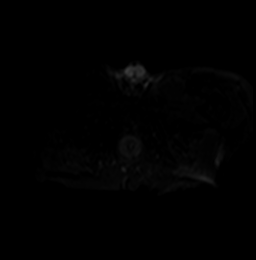
[im 26/86]
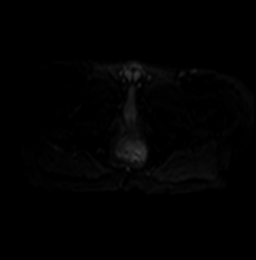
[im 35/86]
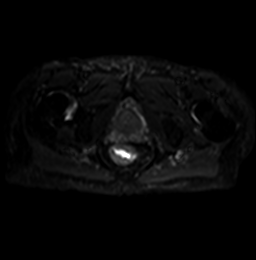
[im 43/86]
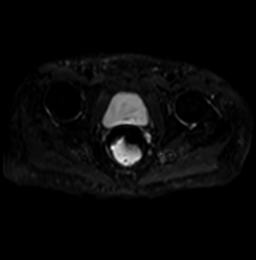
[im 52/86]
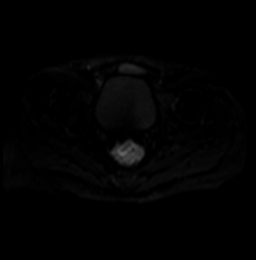
[im 60/86]
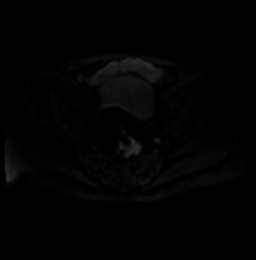
[im 69/86]
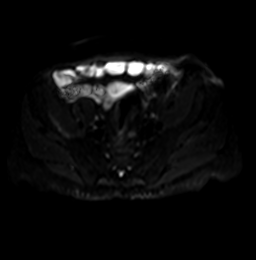
[im 77/86]
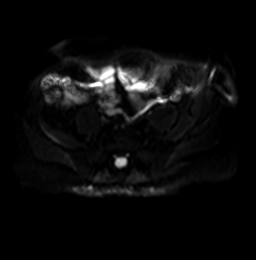
[im 86/86]
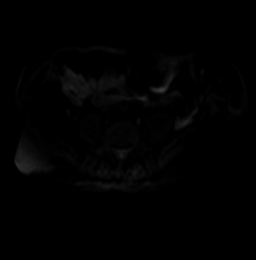

[Series 5: DWI · axial · 5.0mm · 1.48mm/px · z∈[-141,+111]mm · 5 of 43 slices shown (2 of 2)]
[im 1/43]
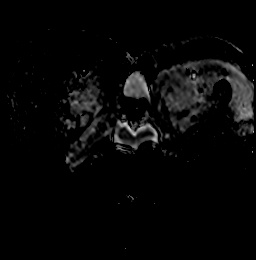
[im 11/43]
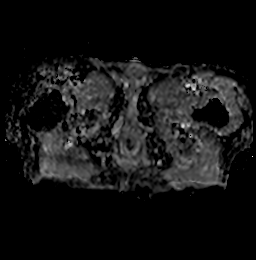
[im 22/43]
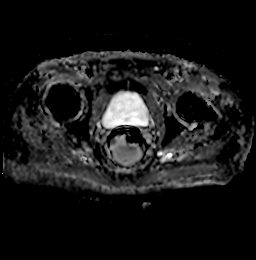
[im 32/43]
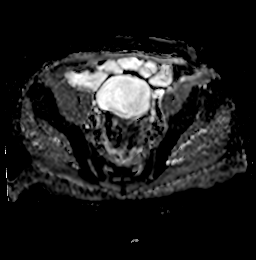
[im 43/43]
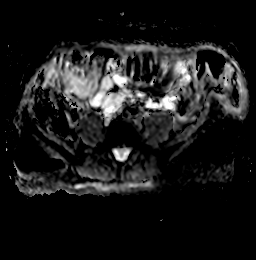

[Series 6: T2 · coronal · 3.0mm · 0.70mm/px · 6 of 51 slices shown (3 of 5)]
[im 1/51]
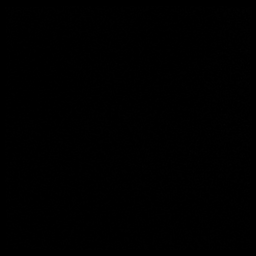
[im 11/51]
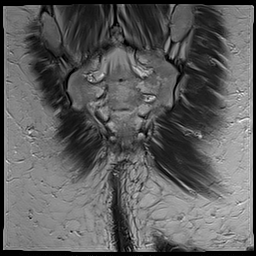
[im 21/51]
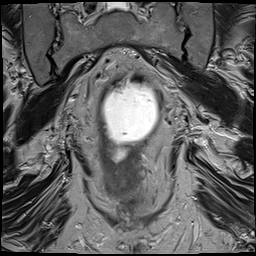
[im 31/51]
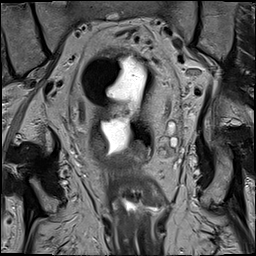
[im 41/51]
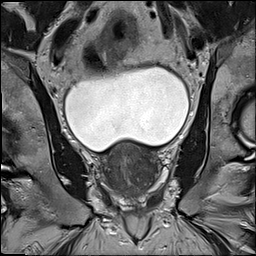
[im 51/51]
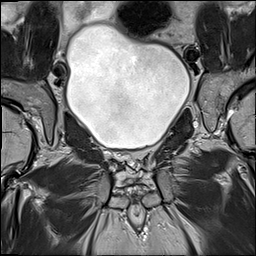

[Series 7: T2 · axial · 3.0mm · 0.70mm/px · z∈[-121,+57]mm · 8 of 61 slices shown (4 of 5)]
[im 1/61]
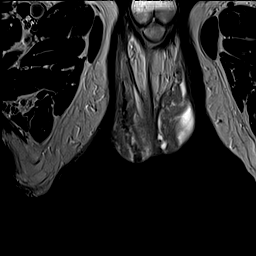
[im 9/61]
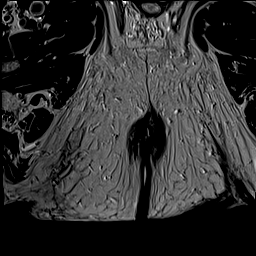
[im 18/61]
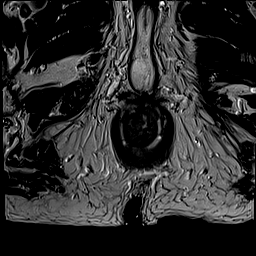
[im 26/61]
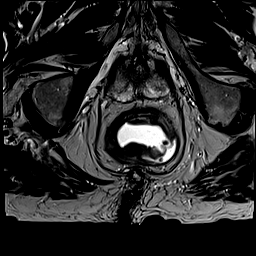
[im 35/61]
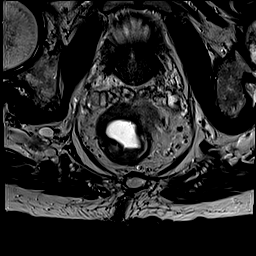
[im 43/61]
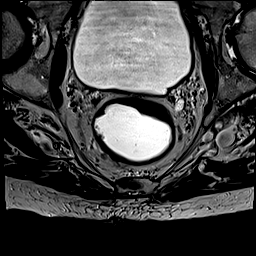
[im 52/61]
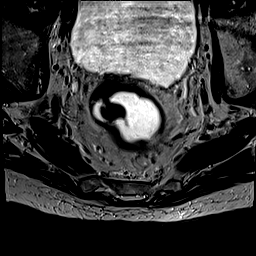
[im 61/61]
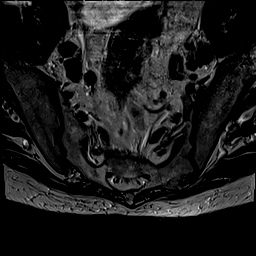

[Series 8: T2 · coronal · 3.0mm · 0.70mm/px · 1 of 45 slices shown (5 of 5)]
[im 1/45]
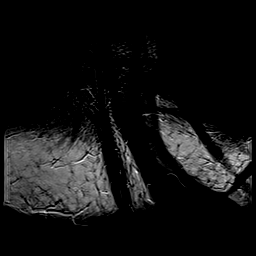

[43 of 48 positions shown; findings below may reference images not displayed]

FINDINGS: TUMOR LOCATION

Tumor distance from Anal Verge/Skin Surface:  2.5 cm

Tumor distance to Internal Anal Sphincter: 0 cm, tumor is seen
involving the upper anal canal

TUMOR DESCRIPTION

Circumferential Extent: 100%

Tumor Length: 6.7 cm

T - CATEGORY

Extension through Muscularis Propria: Yes, along the left
anterolateral wall measuring up to 5 (image [DATE]) = T3b

Shortest Distance of any tumor/node from Mesorectal Fascia: 14 mm

Extramural Vascular Invasion/Tumor Thrombus: No

Invasion of Anterior Peritoneal Reflection: No

Involvement of Adjacent Organs or Pelvic Sidewall: No

Levator Ani Involvement: No

N - CATEGORY

Mesorectal Lymph Nodes >=5mm: None=N0

Extra-mesorectal Lymphadenopathy: No

Other:  None.
IMPRESSION: Rectal adenocarcinoma T stage: T3b

Rectal adenocarcinoma N stage: N0

Distance from tumor to internal anal sphincter is 0 cm; tumor
involves the upper anal sphincter.

ADDENDUM:
Additional review shows a 9 mm lymph node in the high perirectal
space on image [DATE], and a 10 mm extra-mesorectal lymph node is seen
in the left internal iliac chain on image [DATE]. This changes the N
stage by imaging from N0 to N2.

ADDENDUM:
This case was discussed with Dr. CON. Using the 8th edition TNM
staging classification for rectal cancer, with 2 positive regional
lymph nodes, the N-stage is N1b.

*** End of Addendum ***
Addendum:
FINDINGS: TUMOR LOCATION

Tumor distance from Anal Verge/Skin Surface:  2.5 cm

Tumor distance to Internal Anal Sphincter: 0 cm, tumor is seen
involving the upper anal canal

TUMOR DESCRIPTION

Circumferential Extent: 100%

Tumor Length: 6.7 cm

T - CATEGORY

Extension through Muscularis Propria: Yes, along the left
anterolateral wall measuring up to 5 (image [DATE]) = T3b

Shortest Distance of any tumor/node from Mesorectal Fascia: 14 mm

Extramural Vascular Invasion/Tumor Thrombus: No

Invasion of Anterior Peritoneal Reflection: No

Involvement of Adjacent Organs or Pelvic Sidewall: No

Levator Ani Involvement: No

N - CATEGORY

Mesorectal Lymph Nodes >=5mm: None=N0

Extra-mesorectal Lymphadenopathy: No

Other:  None.
IMPRESSION: Rectal adenocarcinoma T stage: T3b

Rectal adenocarcinoma N stage: N0

Distance from tumor to internal anal sphincter is 0 cm; tumor
involves the upper anal sphincter.

ADDENDUM:
Additional review shows a 9 mm lymph node in the high perirectal
space on image [DATE], and a 10 mm extra-mesorectal lymph node is seen
in the left internal iliac chain on image [DATE]. This changes the N
stage by imaging from N0 to N2.

*** End of Addendum ***
FINDINGS: TUMOR LOCATION

Tumor distance from Anal Verge/Skin Surface:  2.5 cm

Tumor distance to Internal Anal Sphincter: 0 cm, tumor is seen
involving the upper anal canal

TUMOR DESCRIPTION

Circumferential Extent: 100%

Tumor Length: 6.7 cm

T - CATEGORY

Extension through Muscularis Propria: Yes, along the left
anterolateral wall measuring up to 5 (image [DATE]) = T3b

Shortest Distance of any tumor/node from Mesorectal Fascia: 14 mm

Extramural Vascular Invasion/Tumor Thrombus: No

Invasion of Anterior Peritoneal Reflection: No

Involvement of Adjacent Organs or Pelvic Sidewall: No

Levator Ani Involvement: No

N - CATEGORY

Mesorectal Lymph Nodes >=5mm: None=N0

Extra-mesorectal Lymphadenopathy: No

Other:  None.
IMPRESSION: Rectal adenocarcinoma T stage: T3b

Rectal adenocarcinoma N stage: N0

Distance from tumor to internal anal sphincter is 0 cm; tumor
involves the upper anal sphincter.

## 2020-09-05 MED ORDER — SODIUM CHLORIDE (PF) 0.9 % IJ SOLN
INTRAMUSCULAR | Status: AC
  Filled 2020-09-05: qty 50

## 2020-09-05 MED ORDER — IOHEXOL 300 MG/ML  SOLN
100.0000 mL | Freq: Once | INTRAMUSCULAR | Status: AC | PRN
  Administered 2020-09-05: 100 mL via INTRAVENOUS

## 2020-09-06 ENCOUNTER — Other Ambulatory Visit: Payer: Self-pay | Admitting: Internal Medicine

## 2020-09-06 ENCOUNTER — Inpatient Hospital Stay: Payer: Medicare PPO | Attending: Hematology and Oncology | Admitting: Hematology and Oncology

## 2020-09-06 ENCOUNTER — Encounter: Payer: Self-pay | Admitting: Hematology and Oncology

## 2020-09-06 ENCOUNTER — Telehealth: Payer: Self-pay | Admitting: Radiation Oncology

## 2020-09-06 ENCOUNTER — Other Ambulatory Visit: Payer: Self-pay

## 2020-09-06 VITALS — BP 120/58 | HR 75 | Temp 97.8°F | Resp 18 | Ht 71.0 in | Wt 149.9 lb

## 2020-09-06 DIAGNOSIS — Z803 Family history of malignant neoplasm of breast: Secondary | ICD-10-CM | POA: Insufficient documentation

## 2020-09-06 DIAGNOSIS — C20 Malignant neoplasm of rectum: Secondary | ICD-10-CM | POA: Insufficient documentation

## 2020-09-06 DIAGNOSIS — D472 Monoclonal gammopathy: Secondary | ICD-10-CM | POA: Diagnosis present

## 2020-09-06 DIAGNOSIS — Z79899 Other long term (current) drug therapy: Secondary | ICD-10-CM | POA: Insufficient documentation

## 2020-09-06 DIAGNOSIS — D509 Iron deficiency anemia, unspecified: Secondary | ICD-10-CM | POA: Insufficient documentation

## 2020-09-06 NOTE — Progress Notes (Signed)
[place] NOTE  Patient Care Team: Alec Dad, MD as PCP - General (Internal Medicine) [facility], [facility], MD as Consulting Physician (Ophthalmology) Alec Pike, MD as Consulting Physician (Hematology and Oncology)  CHIEF COMPLAINTS/PURPOSE OF CONSULTATION:  Consult for anemia.  ASSESSMENT & PLAN:   No problem-specific Assessment & Plan notes found for this encounter.  Orders Placed This Encounter  Procedures   CBC with Differential/Platelet    Standing Status:   Standing    Number of Occurrences:   22    Standing Expiration Date:   09/06/2021   Comprehensive metabolic panel    Standing Status:   Standing    Number of Occurrences:   33    Standing Expiration Date:   09/06/2021   CEA (IN HOUSE-[facility])    Standing Status:   Standing    Number of Occurrences:   3    Standing Expiration Date:   09/06/2021    This is a very pleasant 87 year old male patient who was initially referred to [place] for evaluation of iron deficiency anemia now has a colonoscopy which showed a rectal mass with biopsy confirming invasive adenocarcinoma, currently staged as T3N0 by MRI pelvis although CT imaging showed a suspected lymph node concerning for nodal metastasis.  At this time I still have to clarify with radiology if this is a T3N0 versus a T3N1. I have Alec Weiss discussed that this is a rectal adenocarcinoma without any clear evidence of distant metastasis.  It appears that the liver lesion is most likely a hemangioma.  We have discussed that in an ideal world would like to do total neoadjuvant therapy with chemo preceding chemoradiation followed by surgery.  Sometimes we also consider doing long course chemo RT versus short course RT followed by restaging and considering resection if he is a candidate.  I agree that total neoadjuvant therapy is preferred however given his age and his performance status, I do not know if he is a great candidate for  systemic chemotherapy hence have recommended considering short course RT followed by restaging and surgery if allowed plus or minus adjuvant therapy.  We have discussed about adjuvant FOLFOX today after surgery if he is indeed in the shape to tolerate it at that time.  He would like to meet with the surgeon and radiation oncologist and make his decision.  We have also recommended adding him to the GI tumor board for further discussion.  At this time he is not quite sure about his decision.  He would like to meet all the specialists and then discussed with his daughter about his preference.  He currently continues on oral iron for supplementation.  His last CBC showed a hemoglobin of 8.6 g/dL.  Repeat labs are pending.  Today we focused most of our discussion on rectal adenocarcinoma.  However I am willing to give him some intravenous iron.  Daughter says he is very healthy at baseline and has good memory, he may be asking same questions because he is overwhelmed with the amount of information.  Thank you for consulting [place] in the care of this patient.  Please not hesitate to contact us with any additional questions or concerns.   HISTORY OF PRESENTING ILLNESS:   Alec Weiss 87 y.o. male is here because of new diagnosis of rectal cancer.  Alec Weiss is a very pleasant 87 year old male patient with past medical history significant for retinal detachment and eye surgery, chronic low back pain referred to  hematology for evaluation and recommendations regarding his anemia.    During his initial visit, we discussed about IV iron infusion, GI evaluation and further evaluation of anemia. Colonoscopy showed malignant partially obstructing tumor in the mid rectum as well as distal rectum. Biopsies confirmed invasive adenocarcinoma, intact nuclear expression. He is now here for follow-up after new diagnosis of adenocarcinoma of the rectum.  Since his last visit, I have briefly spoken to him on the phone  discussing the pathology results and the role for imaging.  He also had CT chest abdomen pelvis and MRI pelvis yesterday and is here to discuss the results. CT imaging showed circumferential thickening of the lower rectum, a segment approximately 6 cm in length to the anal verge consistent with primary rectal malignancy, enlarged left pelvic sidewall lymph node measuring up to 1.3 x 1.1 cm not significantly changed compared to prior exam.  No other evidence of distant metastatic disease, ill-defined low-attenuation lesion of the right lobe of the liver is unchanged compared to prior exam and almost most likely a small hemangioma measuring approximately 1.8 x 1.1 cm. MRI pelvis confirmed staging to be T3N0.  He feels tired at the moment, but overall ok. NO major issues.  MEDICAL HISTORY:  Past Medical History:  Diagnosis Date   Actinic keratosis 04/05/2012   Insomnia, unspecified 06/08/2008   Lumbago 01/02/2003   Neoplasm of uncertain behavior of skin 04/05/2012   Pain in limb 02/05/2011   Retinal detachment 2000   left eye   Screening cholesterol level    Shortness of breath 09/07/2008   Vitamin D deficiency 08/04/2012    SURGICAL HISTORY: Past Surgical History:  Procedure Laterality Date   BIOPSY  08/23/2020   Procedure: BIOPSY;  Surgeon: Doran Stabler, MD;  Location: [facility];  Service: Gastroenterology;;   COLONOSCOPY WITH PROPOFOL N/A 08/23/2020   Procedure: COLONOSCOPY WITH PROPOFOL;  Surgeon: Doran Stabler, MD;  Location: [facility];  Service: Gastroenterology;  Laterality: N/A;   ESOPHAGOGASTRODUODENOSCOPY (EGD) WITH PROPOFOL N/A 08/23/2020   Procedure: ESOPHAGOGASTRODUODENOSCOPY (EGD) WITH PROPOFOL;  Surgeon: Doran Stabler, MD;  Location: [facility];  Service: Gastroenterology;  Laterality: N/A;   EXTERNAL EAR SURGERY  05/2012   left ear    INGUINAL HERNIA REPAIR Right 01/21/2005   Dr. [name]   LID LESION EXCISION  2009   MOHS SURGERY Left 05/28/2012   ear lobe Dr.  [name]   RETINAL DETACHMENT SURGERY Left 1997   [facility]   SUBMUCOSAL INJECTION  08/23/2020   Procedure: SUBMUCOSAL INJECTION;  Surgeon: Doran Stabler, MD;  Location: [facility];  Service: Gastroenterology;;    SOCIAL HISTORY: Social History   Socioeconomic History   Marital status: Widowed    Spouse name: Not on file   Number of children: Not on file   Years of education: Not on file   Highest education level: Not on file  Occupational History   Occupation: retired Hydrologist professor  Tobacco Use   Smoking status: Never   Smokeless tobacco: Never  Substance and Sexual Activity   Alcohol use: Yes    Alcohol/week: 1.0 standard drink    Types: 1 Glasses of wine per week    Comment: at dinner every night.   Drug use: No   Sexual activity: Never  Other Topics Concern   Not on file  Social History Narrative   Patient is  Married [name]) since 1958--widowed a few years ago (updating 2021). Retired professor, Hydrologist. Lives in apartment,  Independent Living section at [facility] since 02/2012.       No Smoking history, drinks moderate amount of alcohol    Patient has Advanced planning documents: DNR   Exercise walking - daily ~1 1/2 - 2 miles (~ 1hr daily) as well as [employer] Exercise classes.   Social Determinants of Health   Financial Resource Strain: Not on file  Food Insecurity: Not on file  Transportation Needs: Not on file  Physical Activity: Not on file  Stress: Not on file  Social Connections: Not on file  Intimate Partner Violence: Not on file    FAMILY HISTORY: Family History  Problem Relation Age of Onset   Cancer Mother        breast    ALLERGIES:  is allergic to benzalkonium chloride, maxitrol [neomycin-polymyxin-dexameth], and neosporin [neomycin-bacitracin zn-polymyx].  MEDICATIONS:  Current Outpatient Medications  Medication Sig Dispense Refill   clotrimazole (LOTRIMIN) 1 % cream Apply 1 application topically 2 (two)  times daily. To left knee for two dry scaly areas until resolved (Patient taking differently: Apply 1 application topically 2 (two) times daily as needed (To left knee for two dry scaly areas until resolved).) 40 g 0   esomeprazole (NEXIUM) 40 MG capsule Take 1 capsule (40 mg total) by mouth daily. 30 capsule 3   iron polysaccharides (NU-IRON) 150 MG capsule Take 1 capsule (150 mg total) by mouth daily. 30 capsule 3   Polyethyl Glycol-Propyl Glycol (SYSTANE) 0.4-0.3 % SOLN Place 1 drop into both eyes daily as needed (dry eyes).     vitamin B-12 (CYANOCOBALAMIN) 1000 MCG tablet Take 1 Tablet Daily 100 tablet 1   Vitamin D, Ergocalciferol, (DRISDOL) 1.25 MG (50000 UNIT) CAPS capsule TAKE ONE CAPSULE ONCE A WEEK FOR VITAMIN D SUPPLEMENT. (Patient taking differently: Take 50,000 Units by mouth every Tuesday. TAKE ONE CAPSULE ONCE A WEEK FOR VITAMIN D SUPPLEMENT.) 4 capsule 1   No current facility-administered medications for this visit.     PHYSICAL EXAMINATION: ECOG PERFORMANCE STATUS: 1 - Symptomatic but completely ambulatory  Vitals:   09/06/20 1248  BP: (!) 120/58  Pulse: 75  Resp: 18  Temp: 97.8 F (36.6 C)  SpO2: 99%   Filed Weights   09/06/20 1248  Weight: 149 lb 14.4 oz (68 kg)   PE deferred today in lieu of counseling.  LABORATORY DATA:  I have reviewed the data as listed Lab Results  Component Value Date   WBC 6.6 08/23/2020   HGB 8.6 (L) 08/23/2020   HCT 27.9 (L) 08/23/2020   MCV 81.6 08/23/2020   PLT 353 08/23/2020     Chemistry      Component Value Date/Time   NA 137 07/11/2020 1120   NA 132 (A) 07/05/2020 1528   K 4.2 07/11/2020 1120   CL 101 07/11/2020 1120   CO2 28 07/11/2020 1120   BUN 12 07/11/2020 1120   BUN 16 07/05/2020 1528   CREATININE 0.70 09/05/2020 1430   CREATININE 0.81 07/11/2020 1120   GLU 105 07/05/2020 1528      Component Value Date/Time   CALCIUM 9.5 07/11/2020 1120   ALKPHOS 86 07/11/2020 1120   AST 17 07/11/2020 1120   ALT 8  07/11/2020 1120   BILITOT 0.4 07/11/2020 1120       RADIOGRAPHIC STUDIES: I have personally reviewed the radiological images as listed and agreed with the findings in the report. MR Pelvis Wo Contrast  Result Date: 09/06/2020 CLINICAL DATA:  Newly diagnosed rectal adenocarcinoma. EXAM: MRI  PELVIS WITHOUT CONTRAST TECHNIQUE: Multiplanar multisequence MR imaging of the pelvis was performed. No intravenous contrast was administered. Ultrasound gel was administered per rectum to optimize tumor evaluation. COMPARISON:  None. FINDINGS: TUMOR LOCATION Tumor distance from Anal Verge/Skin Surface:  2.5 cm Tumor distance to Internal Anal Sphincter: 0 cm, tumor is seen involving the upper anal canal TUMOR DESCRIPTION Circumferential Extent: 100% Tumor Length: 6.7 cm T - CATEGORY Extension through Muscularis Propria: Yes, along the left anterolateral wall measuring up to 5 (image 31/7) = T3b Shortest Distance of any tumor/node from Mesorectal Fascia: 14 mm Extramural Vascular Invasion/Tumor Thrombus: No Invasion of Anterior Peritoneal Reflection: No Involvement of Adjacent Organs or Pelvic Sidewall: No Levator Ani Involvement: No N - CATEGORY Mesorectal Lymph Nodes >=74mm: None=N0 Extra-mesorectal Lymphadenopathy: No Other:  None. IMPRESSION: Rectal adenocarcinoma T stage: T3b Rectal adenocarcinoma N stage: N0 Distance from tumor to internal anal sphincter is 0 cm; tumor involves the upper anal sphincter. Electronically Signed   By: Marlaine Hind M.D.   On: 09/06/2020 13:06   CT CHEST ABDOMEN PELVIS W CONTRAST  Result Date: 09/06/2020 CLINICAL DATA:  Rectal cancer staging EXAM: CT CHEST, ABDOMEN, AND PELVIS WITH CONTRAST TECHNIQUE: Multidetector CT imaging of the chest, abdomen and pelvis was performed following the standard protocol during bolus administration of intravenous contrast. CONTRAST:  1107mL OMNIPAQUE IOHEXOL 300 MG/ML SOLN, additional oral enteric contrast COMPARISON:  CT abdomen pelvis, 01/10/2020  FINDINGS: CT CHEST FINDINGS Cardiovascular: Aortic atherosclerosis. Normal heart size. Left and right coronary artery calcifications no pericardial effusion. Mediastinum/Nodes: No enlarged mediastinal, hilar, or axillary lymph nodes. Small hiatal hernia. Thyroid gland, trachea, and esophagus demonstrate no significant findings. Lungs/Pleura: Lungs are clear. No pleural effusion or pneumothorax. Musculoskeletal: No chest wall mass or suspicious bone lesions identified. CT ABDOMEN PELVIS FINDINGS Hepatobiliary: No solid liver abnormality is seen. Ill-defined low-attenuation lesion of the right lobe of the liver is unchanged compared to prior examination and incompletely characterized although most likely a small hemangioma, measuring approximately 1.8 x 1.1 cm (series 2, image 58). No gallstones, gallbladder wall thickening, or biliary dilatation. Pancreas: Unremarkable. No pancreatic ductal dilatation or surrounding inflammatory changes. Spleen: Normal in size without significant abnormality. Adrenals/Urinary Tract: Adrenal glands are unremarkable. Kidneys are normal, without renal calculi, solid lesion, or hydronephrosis. Bladder is unremarkable. Stomach/Bowel: Stomach is within normal limits. Appendix is not clearly visualized. Large burden of stool throughout the colon. Descending and sigmoid diverticulosis. Circumferential thickening of the low rectum, a segment approximately 6 cm in length to the anal verge Vascular/Lymphatic: Aortic atherosclerosis. Enlarged left pelvic sidewall lymph node measuring up to 1.3 x 1.1 cm, not significantly changed compared to prior examination (series 2, image 106). Reproductive: Prostatomegaly. Other: No abdominal wall hernia or abnormality. No abdominopelvic ascites. Musculoskeletal: No acute or significant osseous findings. IMPRESSION: 1. Circumferential thickening of the low rectum, a segment approximately 6 cm in length to the anal verge, consistent with primary rectal  malignancy. Please see forthcoming MR examination of the pelvis for staging. 2. Enlarged left pelvic sidewall lymph node measuring up to 1.3 x 1.1 cm, not significantly changed compared to prior examination. This is suspicious for nodal metastatic disease. 3. No evidence of distant metastatic disease in the chest, abdomen, or pelvis. 4. Ill-defined low-attenuation lesion of the right lobe of the liver is unchanged compared to prior examination and incompletely characterized, although most likely a small hemangioma, measuring approximately 1.8 x 1.1 cm. This could be further characterized by contrast enhanced MR if desired. 5. Prostatomegaly. 6.  Coronary artery disease. Aortic Atherosclerosis (ICD10-I70.0). Electronically Signed   By: Eddie Candle M.D.   On: 09/06/2020 12:18     All questions were answered. The patient knows to call the clinic with any problems, questions or concerns. I spent 40 minutes in the care of the patient, we discussed all available results, imaging results, treatment recommendations in detail. Referrals made to surgery, rad onc, tumor board discussion and our NN.    Alec Pike, MD 09/06/2020 5:42 PM

## 2020-09-06 NOTE — Progress Notes (Signed)
Referrals placed for Radiation oncology and surgical consults.

## 2020-09-07 ENCOUNTER — Telehealth: Payer: Self-pay | Admitting: Hematology and Oncology

## 2020-09-07 ENCOUNTER — Encounter: Payer: Self-pay | Admitting: Internal Medicine

## 2020-09-07 NOTE — Telephone Encounter (Signed)
Scheduled per 07/07 scheduled message, called and spoke with patient's daughter.

## 2020-09-10 ENCOUNTER — Ambulatory Visit (HOSPITAL_COMMUNITY): Payer: Medicare PPO

## 2020-09-10 ENCOUNTER — Observation Stay (HOSPITAL_COMMUNITY): Payer: Medicare PPO

## 2020-09-10 LAB — TRANSFERRIN SATURATION: TRANSFERRIN SATURATION: 333

## 2020-09-11 LAB — IRON,TIBC AND FERRITIN PANEL
Ferritin: 7.04
Iron: 16
TIBC: 369
UIBC: 354

## 2020-09-12 ENCOUNTER — Other Ambulatory Visit: Payer: Self-pay

## 2020-09-12 ENCOUNTER — Other Ambulatory Visit: Payer: Self-pay | Admitting: Hematology and Oncology

## 2020-09-12 ENCOUNTER — Ambulatory Visit: Payer: Medicare PPO | Admitting: Hematology and Oncology

## 2020-09-12 DIAGNOSIS — C20 Malignant neoplasm of rectum: Secondary | ICD-10-CM

## 2020-09-12 DIAGNOSIS — K769 Liver disease, unspecified: Secondary | ICD-10-CM

## 2020-09-12 NOTE — Progress Notes (Signed)
The proposed treatment discussed in conference is for discussion purpose only and is not a binding recommendation.  The patients have not been physically examined, or presented with their treatment options.  Therefore, final treatment plans cannot be decided.  

## 2020-09-12 NOTE — Progress Notes (Signed)
Genetics referral placed per TB discussion

## 2020-09-12 NOTE — Progress Notes (Signed)
GI Location of Tumor / Histology: Rectal Cancer  Alec Weiss notes he has had trouble with diarrhea and constipation over about a year.  Was seen for anemia and colonoscopy was performed that found the rectal mass.  MRI Liver 09/17/2020:  MRI Pelvis 09/05/2020:  Rectal adenocarcinoma T stage: T3b   Rectal adenocarcinoma N stage: N0   Distance from tumor to internal anal sphincter is 0 cm; tumor involves the upper anal sphincter.  CT CAP 09/05/2020: Circumferential thickening of the low rectum, a segment approximately 6 cm in length to the anal verge, consistent with primary rectal malignancy. Please see forthcoming MR examination of the pelvis for staging.  Enlarged left pelvic sidewall lymph node measuring up to 1.3 x 1.1 cm, not significantly changed compared to prior examination.  This is suspicious for nodal metastatic disease.  No evidence of distant metastatic disease in the chest, abdomen, or pelvis.  Ill-defined low-attenuation lesion of the right lobe of the liver is unchanged compared to prior examination and incompletely characterized, although most likely a small hemangioma, measuring approximately 1.8 x 1.1 cm.  Colonoscopy: malignant partially obstructing tumor in the mid rectum as well as distal rectum.  Biopsies of Rectal Mass 08/23/2020   Past/Anticipated interventions by surgeon, if any:  Dr. Dema Severin 09/19/2020  Past/Anticipated interventions by medical oncology, if any:  [name] 09/06/2020 - total neoadjuvant therapy is preferred however given his age and his performance status, I do not know if he is a great candidate for systemic chemotherapy hence have recommended considering short course RT followed by restaging and surgery if allowed plus or minus adjuvant therapy. - He would like to meet with the surgeon and radiation oncologist and make his decision. -Follow-up 09/20/2020  Weight changes, if any: Stable  Bowel/Bladder complaints, if any: Fluctuates between  diarrhea and constipation.  Nausea / Vomiting, if any: No  Pain issues, if any: No  Any blood per rectum: Occasional with wiping.     SAFETY ISSUES: Prior radiation? No Pacemaker/ICD? No Possible current pregnancy? N/a Is the patient on methotrexate? No  Current Complaints/Details:

## 2020-09-13 ENCOUNTER — Ambulatory Visit
Admission: RE | Admit: 2020-09-13 | Discharge: 2020-09-13 | Disposition: A | Discharge status: Home / Self Care | Payer: Medicare PPO | Source: Ambulatory Visit | Attending: Radiation Oncology | Admitting: Radiation Oncology

## 2020-09-13 ENCOUNTER — Encounter: Payer: Self-pay | Admitting: Radiation Oncology

## 2020-09-13 ENCOUNTER — Encounter: Payer: Self-pay | Admitting: Hematology and Oncology

## 2020-09-13 ENCOUNTER — Other Ambulatory Visit: Payer: Medicare PPO

## 2020-09-13 ENCOUNTER — Other Ambulatory Visit: Payer: Self-pay

## 2020-09-13 ENCOUNTER — Inpatient Hospital Stay: Payer: Medicare PPO

## 2020-09-13 ENCOUNTER — Telehealth: Payer: Self-pay | Admitting: Hematology and Oncology

## 2020-09-13 VITALS — BP 149/70 | HR 68 | Temp 98.4°F | Resp 16 | Ht 71.0 in | Wt 149.0 lb

## 2020-09-13 DIAGNOSIS — C20 Malignant neoplasm of rectum: Secondary | ICD-10-CM | POA: Diagnosis present

## 2020-09-13 DIAGNOSIS — K5730 Diverticulosis of large intestine without perforation or abscess without bleeding: Secondary | ICD-10-CM | POA: Diagnosis not present

## 2020-09-13 DIAGNOSIS — Z79899 Other long term (current) drug therapy: Secondary | ICD-10-CM | POA: Insufficient documentation

## 2020-09-13 DIAGNOSIS — Z85828 Personal history of other malignant neoplasm of skin: Secondary | ICD-10-CM | POA: Diagnosis not present

## 2020-09-13 DIAGNOSIS — E559 Vitamin D deficiency, unspecified: Secondary | ICD-10-CM | POA: Insufficient documentation

## 2020-09-13 DIAGNOSIS — D509 Iron deficiency anemia, unspecified: Secondary | ICD-10-CM | POA: Diagnosis not present

## 2020-09-13 DIAGNOSIS — I2510 Atherosclerotic heart disease of native coronary artery without angina pectoris: Secondary | ICD-10-CM | POA: Insufficient documentation

## 2020-09-13 DIAGNOSIS — G4700 Insomnia, unspecified: Secondary | ICD-10-CM | POA: Insufficient documentation

## 2020-09-13 DIAGNOSIS — I700 Atherosclerosis of aorta: Secondary | ICD-10-CM | POA: Insufficient documentation

## 2020-09-13 LAB — CBC WITH DIFFERENTIAL/PLATELET
Abs Immature Granulocytes: 0.02 10*3/uL (ref 0.00–0.07)
Basophils Absolute: 0 10*3/uL (ref 0.0–0.1)
Basophils Relative: 1 %
Eosinophils Absolute: 0 10*3/uL (ref 0.0–0.5)
Eosinophils Relative: 0 %
HCT: 25 % — ABNORMAL LOW (ref 39.0–52.0)
Hemoglobin: 7.7 g/dL — ABNORMAL LOW (ref 13.0–17.0)
Immature Granulocytes: 0 %
Lymphocytes Relative: 14 %
Lymphs Abs: 0.9 10*3/uL (ref 0.7–4.0)
MCH: 24.2 pg — ABNORMAL LOW (ref 26.0–34.0)
MCHC: 30.8 g/dL (ref 30.0–36.0)
MCV: 78.6 fL — ABNORMAL LOW (ref 80.0–100.0)
Monocytes Absolute: 0.9 10*3/uL (ref 0.1–1.0)
Monocytes Relative: 14 %
Neutro Abs: 4.5 10*3/uL (ref 1.7–7.7)
Neutrophils Relative %: 71 %
Platelets: 340 10*3/uL (ref 150–400)
RBC: 3.18 MIL/uL — ABNORMAL LOW (ref 4.22–5.81)
RDW: 17 % — ABNORMAL HIGH (ref 11.5–15.5)
WBC: 6.3 10*3/uL (ref 4.0–10.5)
nRBC: 0 % (ref 0.0–0.2)

## 2020-09-13 LAB — COMPREHENSIVE METABOLIC PANEL
ALT: 9 U/L (ref 0–44)
AST: 13 U/L — ABNORMAL LOW (ref 15–41)
Albumin: 3.4 g/dL — ABNORMAL LOW (ref 3.5–5.0)
Alkaline Phosphatase: 85 U/L (ref 38–126)
Anion gap: 7 (ref 5–15)
BUN: 12 mg/dL (ref 8–23)
CO2: 27 mmol/L (ref 22–32)
Calcium: 9.4 mg/dL (ref 8.9–10.3)
Chloride: 98 mmol/L (ref 98–111)
Creatinine, Ser: 0.81 mg/dL (ref 0.61–1.24)
GFR, Estimated: >60 mL/min (ref >60)
Glucose, Bld: 88 mg/dL (ref 70–99)
Potassium: 4.7 mmol/L (ref 3.5–5.1)
Sodium: 132 mmol/L — ABNORMAL LOW (ref 135–145)
Total Bilirubin: 0.4 mg/dL (ref 0.3–1.2)
Total Protein: 6.9 g/dL (ref 6.5–8.1)

## 2020-09-13 LAB — CEA (IN HOUSE-CHCC): CEA (CHCC-In House): 7.15 ng/mL — ABNORMAL HIGH (ref 0.00–5.00)

## 2020-09-13 NOTE — Progress Notes (Signed)
Radiation Oncology         [phone] ________________________________  Name: Alec Weiss        MRN: [id number]  Date of Service: 09/13/2020 DOB: 11/16/1926  CC:Weiss, Alec L, MD  Weiss, Praveena, MD     REFERRING PHYSICIAN: Iruku, Praveena, MD   DIAGNOSIS: The encounter diagnosis was Adenocarcinoma of rectum (HCC).   HISTORY OF PRESENT ILLNESS: Alec Weiss is a 87 y.o. male seen at the request of Dr. Iruku for newly diagnosed rectal carcinoma.  The patient was originally found to have iron deficiency anemia and was sent to hematology and saw Dr. Iruku he was counseled on colonoscopy which unfortunately showed a rectal mass in the mid rectum approximately 10 to 13 cm from the anal verge and measured about 3 cm in length it was a circumferential partially obstructing mass the lesion was tattooed, there was a more separately noted distal mass as well about 6 to 7 cm in length also involving the proximal radius this was also partially obstructing and circumferential.  Dr. Davis performed this procedure as well as endoscopy on 08/23/2020 and an 8 cm hiatal hernia and salmon-colored mucosa of the esophagus was consistent with Barrett's findings otherwise his findings were normal and no specimens were obtained, final pathology revealed invasive adenocarcinoma of both biopsied lesion interestingly there was loss of nuclear expression of MLH1 and PMS2 of the second specimen which is believed to be the distal mass.  With these findings he has undergone staging CT scans of the chest abdomen and pelvis which shows circumferential thickening of the lower rectum 6 cm in length to the anal verge with enlarging left pelvic sidewall adenopathy up to 1.3 cm on no evidence of disease in the chest there was an ill-defined low-attenuation lesion in the right lobe of the liver unchanged to prior examination but incompletely visualized measuring 1.8 cm felt to be likely a hemangioma prostamegaly and coronary vessel  disease were identified as well.  He underwent an MRI of the pelvis also on 09/05/2020 his tumor in the rectum was staged as T3b, N0 the distance from the tumor to the internal anal sphincter was 0 cm and the tumor involved the upper anal sphincter.  His case was discussed in multidisciplinary GI oncology conference and it was felt that the MRI underreported the nodal involvement and it was classified as N1 disease. Dr. Iruku  plans a MRI of the liver further characterization. He has a good performance status so definitive therapy is being considered and he's seen today to discuss treatment options.     PREVIOUS RADIATION THERAPY: No   PAST MEDICAL HISTORY:  Past Medical History:  Diagnosis Date   Actinic keratosis 04/05/2012   Insomnia, unspecified 06/08/2008   Lumbago 01/02/2003   Neoplasm of uncertain behavior of skin 04/05/2012   Pain in limb 02/05/2011   Retinal detachment 2000   left eye   Screening cholesterol level    Shortness of breath 09/07/2008   Vitamin D deficiency 08/04/2012       PAST SURGICAL HISTORY: Past Surgical History:  Procedure Laterality Date   BIOPSY  08/23/2020   Procedure: BIOPSY;  Surgeon: Weiss, Alec L III, MD;  Location: [facility];  Service: Gastroenterology;;   COLONOSCOPY WITH PROPOFOL N/A 08/23/2020   Procedure: COLONOSCOPY WITH PROPOFOL;  Surgeon: Weiss, Alec L III, MD;  Location: [facility];  Service: Gastroenterology;  Laterality: N/A;   ESOPHAGOGASTRODUODENOSCOPY (EGD) WITH PROPOFOL N/A 08/23/2020   Procedure: ESOPHAGOGASTRODUODENOSCOPY (EGD) WITH   PROPOFOL;  Surgeon: Alec Stabler, MD;  Location: [facility];  Service: Gastroenterology;  Laterality: N/A;   EXTERNAL EAR SURGERY  05/2012   left ear    INGUINAL HERNIA REPAIR Right 01/21/2005   Dr. Rebekah Weiss   LID LESION EXCISION  2009   MOHS SURGERY Left 05/28/2012   ear lobe Dr. [name]   RETINAL DETACHMENT SURGERY Left 1997   [facility]   SUBMUCOSAL INJECTION  08/23/2020   Procedure: SUBMUCOSAL  INJECTION;  Surgeon: Alec Stabler, MD;  Location: [facility];  Service: Gastroenterology;;     FAMILY HISTORY:  Family History  Problem Relation Age of Onset   Cancer Mother        breast     SOCIAL HISTORY:  reports that he has never smoked. He has never used smokeless tobacco. He reports current alcohol use of about 1.0 standard drink of alcohol per week. He reports that he does not use drugs. The patient is widowed and lives in [place] at [facility]. He is a retired Health and safety inspector, and is accompanied by his daughter Alec Weiss who is a Investment banker, operational at [employer].   ALLERGIES: Benzalkonium chloride, Maxitrol [neomycin-polymyxin-dexameth], and Neosporin [neomycin-bacitracin zn-polymyx]   MEDICATIONS:  Current Outpatient Medications  Medication Sig Dispense Refill   clotrimazole (LOTRIMIN) 1 % cream Apply 1 application topically 2 (two) times daily. To left knee for two dry scaly areas until resolved (Patient taking differently: Apply 1 application topically 2 (two) times daily as needed (To left knee for two dry scaly areas until resolved).) 40 g 0   esomeprazole (NEXIUM) 40 MG capsule Take 1 capsule (40 mg total) by mouth daily. 30 capsule 3   iron polysaccharides (NU-IRON) 150 MG capsule Take 1 capsule (150 mg total) by mouth daily. 30 capsule 3   Polyethyl Glycol-Propyl Glycol (SYSTANE) 0.4-0.3 % SOLN Place 1 drop into both eyes daily as needed (dry eyes).     vitamin B-12 (CYANOCOBALAMIN) 1000 MCG tablet Take 1 Tablet Daily 100 tablet 1   Vitamin D, Ergocalciferol, (DRISDOL) 1.25 MG (50000 UNIT) CAPS capsule TAKE ONE CAPSULE ONCE A WEEK FOR VITAMIN D SUPPLEMENT. (Patient taking differently: Take 50,000 Units by mouth every Tuesday. TAKE ONE CAPSULE ONCE A WEEK FOR VITAMIN D SUPPLEMENT.) 4 capsule 1   No current facility-administered medications for this encounter.     REVIEW OF SYSTEMS: On review of systems, the patient reports that he has alternating  bowel habits of constipation or diarrhea. He denies any rectal pain or bleeding, and denies any pain anywhere in his body, difficulty with chest pain or breathing. No other complaints are verbalized.      PHYSICAL EXAM:  Wt Readings from Last 3 Encounters:  09/13/20 149 lb (67.6 kg)  09/06/20 149 lb 14.4 oz (68 kg)  08/23/20 150 lb (68 kg)   Temp Readings from Last 3 Encounters:  09/13/20 98.4 F (36.9 C)  09/06/20 97.8 F (36.6 C) (Tympanic)  08/23/20 (!) 96.8 F (36 C) (Temporal)   BP Readings from Last 3 Encounters:  09/13/20 (!) 149/70  09/06/20 (!) 120/58  08/23/20 (!) 123/49   Pulse Readings from Last 3 Encounters:  09/13/20 68  09/06/20 75  08/23/20 67   Pain Assessment Pain Score: 0-No pain/10  In general this is a well appearing elderly caucasian male in no acute distress. He's alert and oriented x4 and appropriate throughout the examination. Cardiopulmonary assessment is negative for acute distress and he exhibits normal effort.  ECOG = 1  0 - Asymptomatic (Fully active, able to carry on all predisease activities without restriction)  1 - Symptomatic but completely ambulatory (Restricted in physically strenuous activity but ambulatory and able to carry out work of a light or sedentary nature. For example, light housework, office work)  2 - Symptomatic, <50% in bed during the day (Ambulatory and capable of all self care but unable to carry out any work activities. Up and about more than 50% of waking hours)  3 - Symptomatic, >50% in bed, but not bedbound (Capable of only limited self-care, confined to bed or chair 50% or more of waking hours)  4 - Bedbound (Completely disabled. Cannot carry on any self-care. Totally confined to bed or chair)  5 - Death   Oken MM, Creech RH, Tormey DC, et al. (1982). "Toxicity and response criteria of the Eastern Cooperative Oncology Group". Am. J. Clin. Oncol. 5 (6): 649-55    LABORATORY DATA:  Lab Results  Component  Value Date   WBC 6.3 09/13/2020   HGB 7.7 (L) 09/13/2020   HCT 25.0 (L) 09/13/2020   MCV 78.6 (L) 09/13/2020   PLT 340 09/13/2020   Lab Results  Component Value Date   NA 132 (L) 09/13/2020   K 4.7 09/13/2020   CL 98 09/13/2020   CO2 27 09/13/2020   Lab Results  Component Value Date   ALT 9 09/13/2020   AST 13 (L) 09/13/2020   ALKPHOS 85 09/13/2020   BILITOT 0.4 09/13/2020      RADIOGRAPHY: MR Pelvis Wo Contrast  Result Date: 09/06/2020 CLINICAL DATA:  Newly diagnosed rectal adenocarcinoma. EXAM: MRI PELVIS WITHOUT CONTRAST TECHNIQUE: Multiplanar multisequence MR imaging of the pelvis was performed. No intravenous contrast was administered. Ultrasound gel was administered per rectum to optimize tumor evaluation. COMPARISON:  None. FINDINGS: TUMOR LOCATION Tumor distance from Anal Verge/Skin Surface:  2.5 cm Tumor distance to Internal Anal Sphincter: 0 cm, tumor is seen involving the upper anal canal TUMOR DESCRIPTION Circumferential Extent: 100% Tumor Length: 6.7 cm T - CATEGORY Extension through Muscularis Propria: Yes, along the left anterolateral wall measuring up to 5 (image 31/7) = T3b Shortest Distance of any tumor/node from Mesorectal Fascia: 14 mm Extramural Vascular Invasion/Tumor Thrombus: No Invasion of Anterior Peritoneal Reflection: No Involvement of Adjacent Organs or Pelvic Sidewall: No Levator Ani Involvement: No N - CATEGORY Mesorectal Lymph Nodes >=5mm: None=N0 Extra-mesorectal Lymphadenopathy: No Other:  None. IMPRESSION: Rectal adenocarcinoma T stage: T3b Rectal adenocarcinoma N stage: N0 Distance from tumor to internal anal sphincter is 0 cm; tumor involves the upper anal sphincter. Electronically Signed   By: John A Stahl M.D.   On: 09/06/2020 13:06   CT CHEST ABDOMEN PELVIS W CONTRAST  Result Date: 09/06/2020 CLINICAL DATA:  Rectal cancer staging EXAM: CT CHEST, ABDOMEN, AND PELVIS WITH CONTRAST TECHNIQUE: Multidetector CT imaging of the chest, abdomen and pelvis was  performed following the standard protocol during bolus administration of intravenous contrast. CONTRAST:  100mL OMNIPAQUE IOHEXOL 300 MG/ML SOLN, additional oral enteric contrast COMPARISON:  CT abdomen pelvis, 01/10/2020 FINDINGS: CT CHEST FINDINGS Cardiovascular: Aortic atherosclerosis. Normal heart size. Left and right coronary artery calcifications no pericardial effusion. Mediastinum/Nodes: No enlarged mediastinal, hilar, or axillary lymph nodes. Small hiatal hernia. Thyroid gland, trachea, and esophagus demonstrate no significant findings. Lungs/Pleura: Lungs are clear. No pleural effusion or pneumothorax. Musculoskeletal: No chest wall mass or suspicious bone lesions identified. CT ABDOMEN PELVIS FINDINGS Hepatobiliary: No solid liver abnormality is seen. Ill-defined low-attenuation lesion of the   right lobe of the liver is unchanged compared to prior examination and incompletely characterized although most likely a small hemangioma, measuring approximately 1.8 x 1.1 cm (series 2, image 58). No gallstones, gallbladder wall thickening, or biliary dilatation. Pancreas: Unremarkable. No pancreatic ductal dilatation or surrounding inflammatory changes. Spleen: Normal in size without significant abnormality. Adrenals/Urinary Tract: Adrenal glands are unremarkable. Kidneys are normal, without renal calculi, solid lesion, or hydronephrosis. Bladder is unremarkable. Stomach/Bowel: Stomach is within normal limits. Appendix is not clearly visualized. Large burden of stool throughout the colon. Descending and sigmoid diverticulosis. Circumferential thickening of the low rectum, a segment approximately 6 cm in length to the anal verge Vascular/Lymphatic: Aortic atherosclerosis. Enlarged left pelvic sidewall lymph node measuring up to 1.3 x 1.1 cm, not significantly changed compared to prior examination (series 2, image 106). Reproductive: Prostatomegaly. Other: No abdominal wall hernia or abnormality. No abdominopelvic  ascites. Musculoskeletal: No acute or significant osseous findings. IMPRESSION: 1. Circumferential thickening of the low rectum, a segment approximately 6 cm in length to the anal verge, consistent with primary rectal malignancy. Please see forthcoming MR examination of the pelvis for staging. 2. Enlarged left pelvic sidewall lymph node measuring up to 1.3 x 1.1 cm, not significantly changed compared to prior examination. This is suspicious for nodal metastatic disease. 3. No evidence of distant metastatic disease in the chest, abdomen, or pelvis. 4. Ill-defined low-attenuation lesion of the right lobe of the liver is unchanged compared to prior examination and incompletely characterized, although most likely a small hemangioma, measuring approximately 1.8 x 1.1 cm. This could be further characterized by contrast enhanced MR if desired. 5. Prostatomegaly. 6. Coronary artery disease. Aortic Atherosclerosis (ICD10-I70.0). Electronically Signed   By: Alex  Bibbey M.D.   On: 09/06/2020 12:18       IMPRESSION/PLAN: 1.  Stage III, cT3bN1M0, adenocarcinoma of the distal rectum. Dr. Moody discusses the pathology findings and reviews the nature of adenocarcinoma of the rectum.  We outlined the importance of the MRI of the liver to rule out metastatic disease, his performance status is adequate to consider therapy. Dr. Iruku would like to begin with total neoadjuvant chemotherapy. This may be dose reduced, shortened or modified depending on the patient's tolerability. We discussed the differences between palliative radiotherapy and definitive chemoradiation.  He will also meet with Dr. White next week to discuss surgical options. Dr. Moody discusses  the risks, benefits, short, and long term effects of radiotherapy, as well as the curative intent, and the patient is interested in proceeding at the appropriate interval with definitive treatment. Dr. Moody discusses the delivery and logistics of radiotherapy and anticipates  a course of 5 1/2 weeks of radiotherapy to the pelvis. We will follow along to see decision making in the next week but plan to see him back in about 3 1/2 months.  In a visit lasting 60 minutes, greater than 50% of the time was spent face to face discussing the patient's condition, in preparation for the discussion, and coordinating the patient's care.    The above documentation reflects my direct findings during this shared patient visit. Please see the separate note by Dr. Moody on this date for the remainder of the patient's plan of care.    Alison C. Perkins, PAC   **Disclaimer: This note was dictated with voice recognition software. Similar sounding words can inadvertently be transcribed and this note may contain transcription errors which may not have been corrected upon publication of note.** 

## 2020-09-13 NOTE — Telephone Encounter (Signed)
Scheduled appointment per 07/14 sch msg. Patient will receive updated calender.  

## 2020-09-13 NOTE — Progress Notes (Signed)
I spoke with Mr Alec Weiss and his daughter Alec Weiss regarding the MR of the liver. I explained the reasoning for the MR to better evaluate his liver lesion.  I gave Alec Weiss the date, time, location, and prep for the MRI.  She verbalized understanding and will relay this to her father.

## 2020-09-14 ENCOUNTER — Other Ambulatory Visit: Payer: Self-pay | Admitting: Hematology and Oncology

## 2020-09-14 NOTE — Progress Notes (Unsigned)
Venofer re-ordered since he continues to be iron deficient. This will be scheduled.  [name]

## 2020-09-14 NOTE — Progress Notes (Signed)
I called daughter and spoke about MRI liver as well as the plan I explained that there are two lesions in the rectum, one of them is MSI and the other is MSS If there is no evidence of metastatic disease given MSI in one of the tumors, we have discussed about 2 cycles of immunotherapy Beryle Flock) and assess response. If there is response, then we will continue 2 more cycles of immunotherapy, and then likely proceed with cRT with xeloda. If complete response with chemo RT then surgery team recommends observation. I have explained this very clearly with daughter. She will relay message to her father.  Alec Weiss

## 2020-09-15 ENCOUNTER — Ambulatory Visit (HOSPITAL_COMMUNITY): Payer: Medicare PPO

## 2020-09-17 ENCOUNTER — Ambulatory Visit (HOSPITAL_COMMUNITY)
Admission: RE | Admit: 2020-09-17 | Discharge: 2020-09-17 | Disposition: A | Discharge status: Home / Self Care | Payer: Medicare PPO | Source: Ambulatory Visit | Attending: Hematology and Oncology | Admitting: Hematology and Oncology

## 2020-09-17 ENCOUNTER — Encounter: Payer: Self-pay | Admitting: Adult Health

## 2020-09-17 ENCOUNTER — Other Ambulatory Visit: Payer: Self-pay

## 2020-09-17 ENCOUNTER — Non-Acute Institutional Stay: Payer: Medicare PPO | Admitting: Adult Health

## 2020-09-17 VITALS — BP 108/70 | HR 77 | Temp 97.9°F | Ht 71.0 in | Wt 148.8 lb

## 2020-09-17 DIAGNOSIS — C20 Malignant neoplasm of rectum: Secondary | ICD-10-CM

## 2020-09-17 DIAGNOSIS — K449 Diaphragmatic hernia without obstruction or gangrene: Secondary | ICD-10-CM | POA: Diagnosis not present

## 2020-09-17 DIAGNOSIS — D500 Iron deficiency anemia secondary to blood loss (chronic): Secondary | ICD-10-CM | POA: Diagnosis not present

## 2020-09-17 DIAGNOSIS — R2681 Unsteadiness on feet: Secondary | ICD-10-CM

## 2020-09-17 DIAGNOSIS — K769 Liver disease, unspecified: Secondary | ICD-10-CM | POA: Insufficient documentation

## 2020-09-17 IMAGING — MR MR ABDOMEN WO/W CM
18 series · 48 of 48 positions shown · IV contrast (gadavist)
Comparison: CT scan [DATE] and [DATE]

CLINICAL DATA: Liver lesion, history of rectal cancer.

EXAM:
MRI ABDOMEN WITHOUT AND WITH CONTRAST
TECHNIQUE: Multiplanar multisequence MR imaging of the abdomen was performed
both before and after the administration of intravenous contrast.
CONTRAST:  6mL GADAVIST GADOBUTROL 1 MMOL/ML IV SOLN

[Series 2: DWI · axial · 6.0mm · 1.42mm/px · z∈[-155,+132]mm · 4 of 82 slices shown (1 of 2)]
[im 1/82]
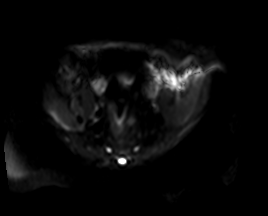
[im 28/82]
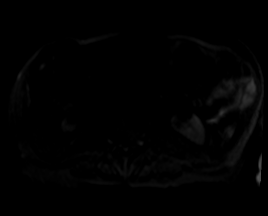
[im 55/82]
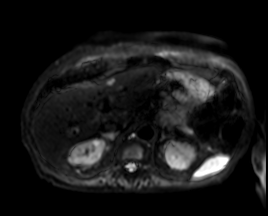
[im 82/82]
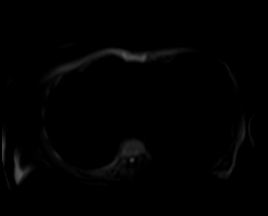

[Series 3: DWI · axial · 6.0mm · 1.42mm/px · z∈[-155,+132]mm · 2 of 41 slices shown (2 of 2)]
[im 1/41]
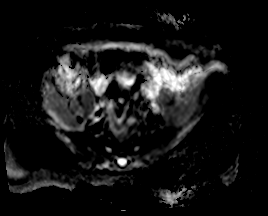
[im 41/41]
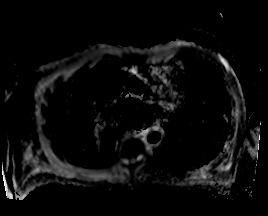

[Series 4: T2 fat-sat · axial · 6.0mm · 1.25mm/px · z∈[-162,+132]mm · 2 of 42 slices shown]
[im 1/42]
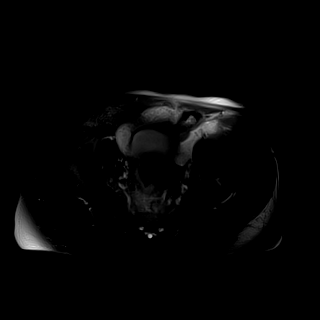
[im 42/42]
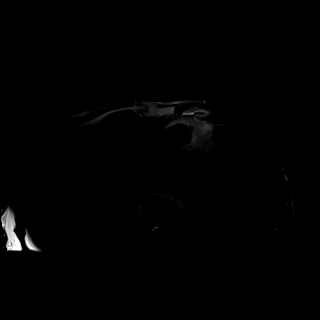

[Series 7: cor haste · coronal · 6.0mm · 1.56mm/px · 1 of 37 slices shown]
[im 1/37]
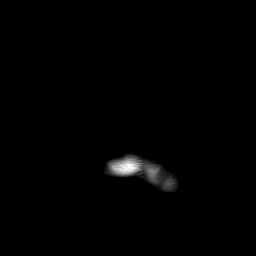

[Series 8: bSSFP · axial · 6.0mm · 2.08mm/px · z∈[-183,+123]mm · 2 of 52 slices shown]
[im 1/52]
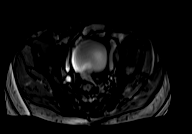
[im 52/52]
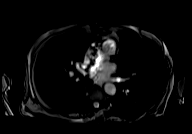

[Series 9: T1 dynamic · axial · 3.0mm · 1.25mm/px · z∈[-169,+115]mm · 3 of 96 slices shown (1 of 9)]
[im 1/96]
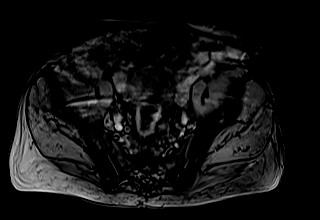
[im 48/96]
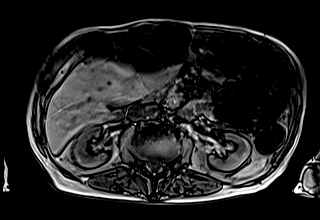
[im 96/96]
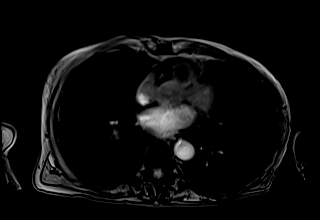

[Series 10: T1 dynamic · axial · 3.0mm · 1.25mm/px · z∈[-169,+115]mm · 3 of 96 slices shown (2 of 9)]
[im 1/96]
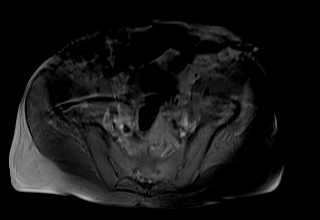
[im 48/96]
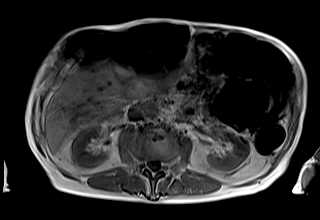
[im 96/96]
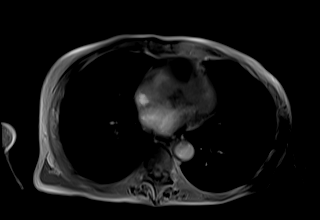

[Series 17: T1 dynamic · axial · 3.0mm · 1.25mm/px · z∈[-169,+115]mm · 3 of 96 slices shown (3 of 9)]
[im 1/96]
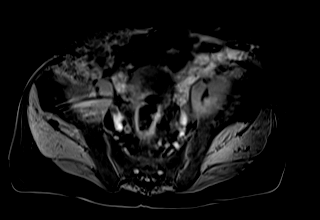
[im 48/96]
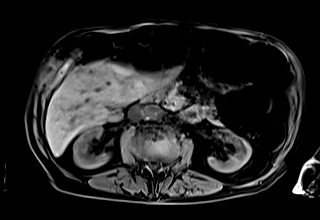
[im 96/96]
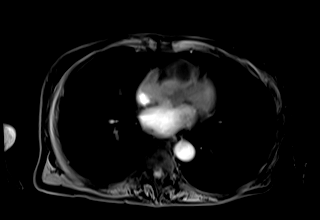

[Series 19: T1 dynamic · axial · 3.0mm · 1.25mm/px · z∈[-169,+115]mm · 3 of 96 slices shown (4 of 9)]
[im 1/96]
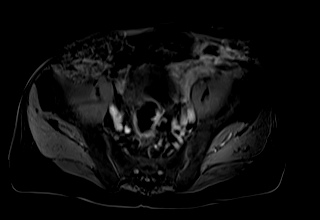
[im 48/96]
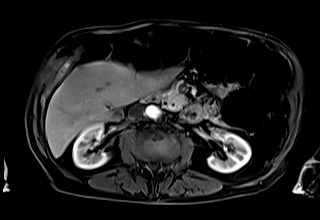
[im 96/96]
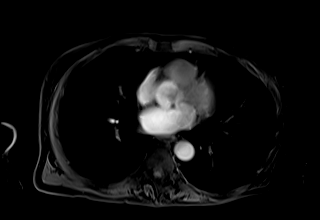

[Series 20: T1 dynamic · axial · 3.0mm · 1.25mm/px · z∈[-169,+115]mm · 3 of 96 slices shown (5 of 9)]
[im 1/96]
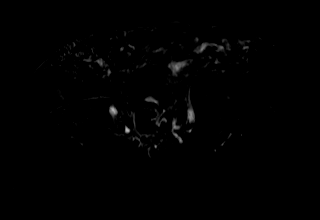
[im 48/96]
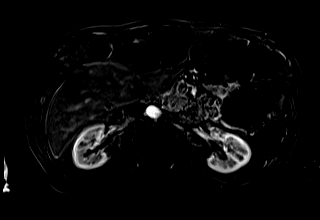
[im 96/96]
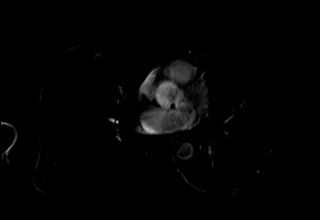

[Series 22: T1 dynamic · axial · 3.0mm · 1.25mm/px · z∈[-169,+115]mm · 3 of 96 slices shown (6 of 9)]
[im 1/96]
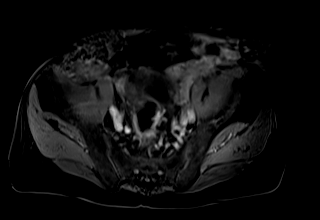
[im 48/96]
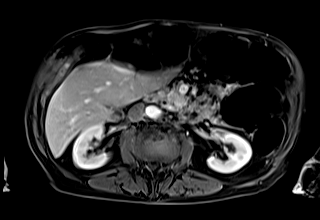
[im 96/96]
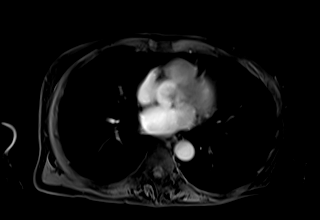

[Series 23: T1 dynamic · axial · 3.0mm · 1.25mm/px · z∈[-169,+115]mm · 3 of 96 slices shown (7 of 9)]
[im 1/96]
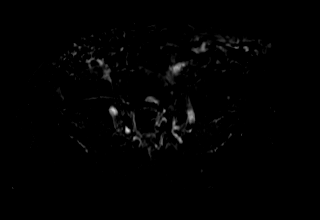
[im 48/96]
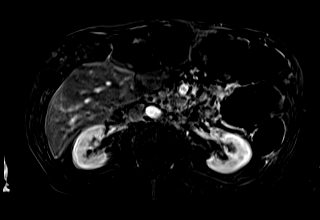
[im 96/96]
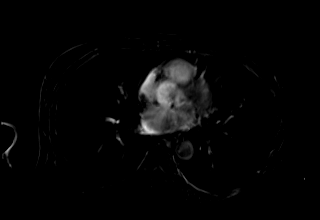

[Series 25: T1 dynamic · axial · 3.0mm · 1.25mm/px · z∈[-169,+115]mm · 3 of 96 slices shown (8 of 9)]
[im 1/96]
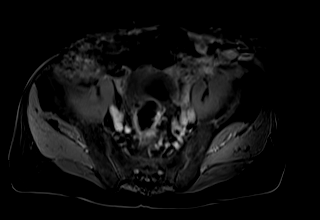
[im 48/96]
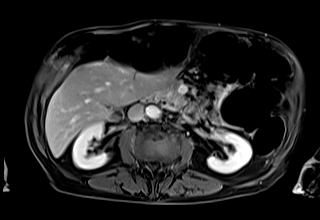
[im 96/96]
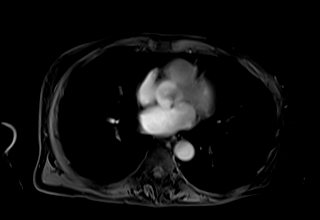

[Series 26: T1 dynamic · axial · 3.0mm · 1.25mm/px · z∈[-169,+115]mm · 3 of 96 slices shown (9 of 9)]
[im 1/96]
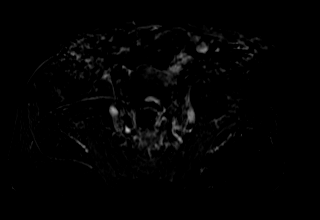
[im 48/96]
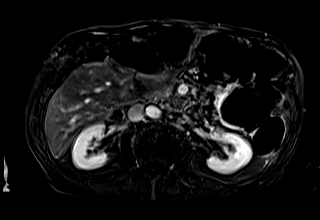
[im 96/96]
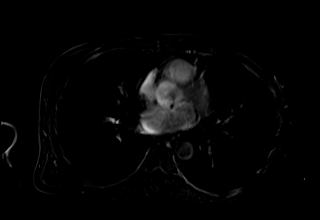

[Series 27: ax_haste_mbh · axial · 6.0mm · 1.25mm/px · 1 of 42 slices shown]
[im 1/42]
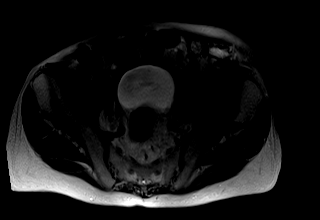

[Series 29: cor_vibe_dixon_delayed_w · coronal · 3.0mm · 2.01mm/px · 3 of 80 slices shown]
[im 1/80]
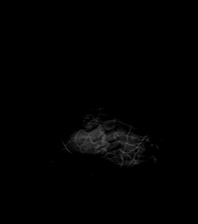
[im 40/80]
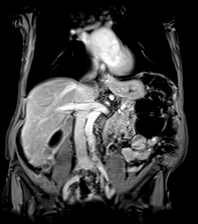
[im 80/80]
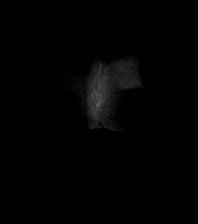

[Series 31: ax_dixon_delayed_w_reg · axial · 3.0mm · 1.25mm/px · z∈[-169,+115]mm · 3 of 96 slices shown]
[im 1/96]
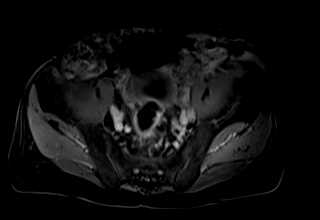
[im 48/96]
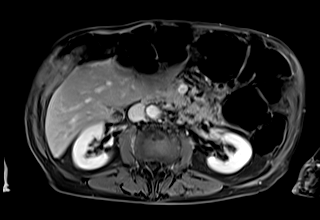
[im 96/96]
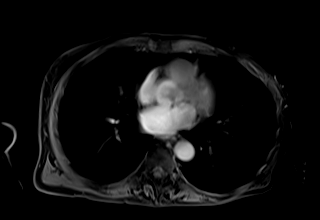

[Series 32: ax_dixon_delayed_w_reg_sub · axial · 3.0mm · 1.25mm/px · z∈[-169,+115]mm · 3 of 96 slices shown]
[im 1/96]
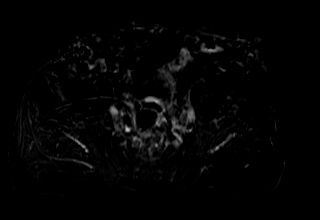
[im 48/96]
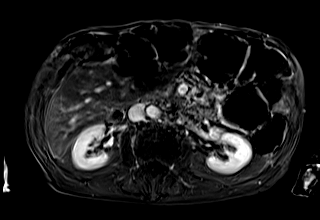
[im 96/96]
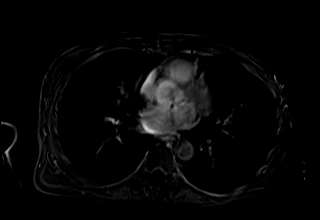

[48 of 48 positions shown; findings below may reference images not displayed]

FINDINGS: Lower chest: Unremarkable

Hepatobiliary: 1.0 by 0.8 cm lesion in the dome of the liver on
image 26 series 19 has high T2 and low T1 signal characteristics and
peripheral centripetal nodular enhancement characteristic of hepatic
hemangioma, along with delayed enhancement.

Similarly, a 2.0 by 1.1 cm lesion in the right hepatic lobe on image
32 of series 19 corresponding to the lesion seen at CT has high T2
and low T1 signal characteristics with peripheral centripetal
discontinuous enhancement and delayed enhancement compatible with
hepatic hemangioma.

A 0.6 cm lesion in the dome of the right hepatic lobe on image 22
series 19 demonstrates delayed enhancement characteristic of
hemangioma.

On image 13 series 4, a small T2 hyperintense lesion anteriorly in
the left hepatic lobe is observed. This is along the dome of the
liver and subject to susceptibility artifact. This lesion is
difficult to appreciate on the other sequences but probably a tiny
hemangioma.

Multiple gallstones are present in the gallbladder including a
dominant 4.6 cm in length stone as well as smaller stones. No
significant biliary dilatation.

Pancreas:  Unremarkable

Spleen:  Unremarkable

Adrenals/Urinary Tract: Small simple cyst of the right kidney upper
pole, 0.9 cm in diameter. The adrenal glands appear unremarkable.

Stomach/Bowel: The patient has a known rectal cancer which is not
included on most of the sequences today. Sigmoid colon
diverticulosis. Small type 1 hiatal hernia.

Vascular/Lymphatic: Aortoiliac atherosclerotic vascular disease. No
upper abdominal adenopathy.

Other:  Prominence of stool in the colon.

Musculoskeletal: Lumbar spondylosis and degenerative disc disease.
IMPRESSION: 1. The lesion of concern in the right hepatic lobe is a benign
hemangioma, as are 2 other nearby lesions in the liver. A small
fourth lesion is present anteriorly in the left hepatic lobe and
occult on most of the series, faintly T2 hyperintense on a single
series, probably also a small hemangioma, but technically
nonspecific due to small size and indistinctness. Surveillance
suggested.
2. Cholelithiasis.
3. Small type 1 hiatal hernia.
4.  Aortic Atherosclerosis ([AV]-[AV]).
5. Prominence of stool in the colon.

## 2020-09-17 MED ORDER — GADOBUTROL 1 MMOL/ML IV SOLN
6.0000 mL | Freq: Once | INTRAVENOUS | Status: AC | PRN
  Administered 2020-09-17: 6 mL via INTRAVENOUS

## 2020-09-17 NOTE — Progress Notes (Signed)
Location:  [facility]  POS:  clinic  Provider:  Cindi Weiss, [place] [phone]   Code Status: DNR Goals of Care:  Advanced Directives 09/17/2020  Does Patient Have a Medical Advance Directive? Yes  Type of Paramedic of [place];Living will  Does patient want to make changes to medical advance directive? No - Patient declined  Copy of [place] in Chart? No - copy requested  Would patient like information on creating a medical advance directive? -  Pre-existing out of facility DNR order (yellow form or pink MOST form) -     Chief Complaint  Patient presents with   Medical Management of Chronic Issues    Patient returns to the clinic for 3 month follow up.     HPI: Patient is a 87 y.o. male seen today for medical management of chronic diseases.   Since our last visit Alec Weiss has been evaluated for iron deficiency anemia with upper endo/colonoscopy. 8 cm hiatal hernia was found and a rectal mass 6-7 cm.  CT of the abd on 09/05/20 noted a 6 cm mass to the anal verge, along with left pelvic lymph node measuring 1.3 cm x 1.1 cm, as well as a lesion in the right lobe of the liver 1.8 cm x 1.1 cm. He had an MRI today to evaluate the liver lesions more closely.   He has been receiving iron infusions and his Hgb 7.7 on 7/14 down from  8.6.  He has not had any frank blood or abd pain but has felt for months that his stool consistency changed to runny.  He has lost 5 lbs in the past year but states he is trying to stay hydrated and eat well. He is not dizzy but has to stand up slowly to avoid this. He is requesting a folding walker for easier travel in the car to apts.   In the past his records reflect that he would not want any aggressive treatment for cancer or otherwise due to his goals of care and age. He feels that if he had treatment and did not return to being functional and able to live independently he would not want  to pursue treatment. At this point he feels he has no other choice but to pursue treatment.  He is seeing oncology later this week to address his concerns and come up with  a treatment plan.   I have noticed in my visits that he does not remember the names of his meds and at times is forgetful but remains able to perform all ADLs independently. He reports his daughter is supportive and is going to his apts regarding treatment options.   Past Medical History:  Diagnosis Date   Actinic keratosis 04/05/2012   Insomnia, unspecified 06/08/2008   Lumbago 01/02/2003   Neoplasm of uncertain behavior of skin 04/05/2012   Pain in limb 02/05/2011   Retinal detachment 2000   left eye   Screening cholesterol level    Shortness of breath 09/07/2008   Vitamin D deficiency 08/04/2012    Past Surgical History:  Procedure Laterality Date   BIOPSY  08/23/2020   Procedure: BIOPSY;  Surgeon: Doran Stabler, MD;  Location: [facility];  Service: Gastroenterology;;   COLONOSCOPY WITH PROPOFOL N/A 08/23/2020   Procedure: COLONOSCOPY WITH PROPOFOL;  Surgeon: Doran Stabler, MD;  Location: [facility];  Service: Gastroenterology;  Laterality: N/A;   ESOPHAGOGASTRODUODENOSCOPY (EGD) WITH PROPOFOL N/A 08/23/2020  Procedure: ESOPHAGOGASTRODUODENOSCOPY (EGD) WITH PROPOFOL;  Surgeon: Doran Stabler, MD;  Location: [facility];  Service: Gastroenterology;  Laterality: N/A;   EXTERNAL EAR SURGERY  05/2012   left ear    INGUINAL HERNIA REPAIR Right 01/21/2005   Dr. [name]   LID LESION EXCISION  2009   MOHS SURGERY Left 05/28/2012   ear lobe Dr. [name]   RETINAL DETACHMENT SURGERY Left 1997   [facility]   SUBMUCOSAL INJECTION  08/23/2020   Procedure: SUBMUCOSAL INJECTION;  Surgeon: Doran Stabler, MD;  Location: [facility];  Service: Gastroenterology;;    Allergies  Allergen Reactions   Benzalkonium Chloride Rash    Very allergic per patient    Maxitrol [Neomycin-Polymyxin-Dexameth] Itching and Rash    Neosporin [Neomycin-Bacitracin Zn-Polymyx] Itching and Rash    Outpatient Encounter Medications as of 09/17/2020  Medication Sig   clotrimazole (LOTRIMIN) 1 % cream Apply 1 application topically 2 (two) times daily. To left knee for two dry scaly areas until resolved (Patient taking differently: Apply 1 application topically 2 (two) times daily as needed (To left knee for two dry scaly areas until resolved).)   esomeprazole (NEXIUM) 40 MG capsule Take 1 capsule (40 mg total) by mouth daily.   iron polysaccharides (NU-IRON) 150 MG capsule Take 1 capsule (150 mg total) by mouth daily.   Polyethyl Glycol-Propyl Glycol (SYSTANE) 0.4-0.3 % SOLN Place 1 drop into both eyes daily as needed (dry eyes).   vitamin B-12 (CYANOCOBALAMIN) 1000 MCG tablet Take 1 Tablet Daily   Vitamin D, Ergocalciferol, (DRISDOL) 1.25 MG (50000 UNIT) CAPS capsule TAKE ONE CAPSULE ONCE A WEEK FOR VITAMIN D SUPPLEMENT. (Patient taking differently: Take 50,000 Units by mouth every Tuesday. TAKE ONE CAPSULE ONCE A WEEK FOR VITAMIN D SUPPLEMENT.)   [EXPIRED] gadobutrol (GADAVIST) 1 MMOL/ML injection 6 mL    No facility-administered encounter medications on file as of 09/17/2020.    Review of Systems:  Review of Systems  Constitutional:  Negative for activity change, appetite change, chills, diaphoresis, fatigue and fever.  HENT:  Negative for congestion.   Respiratory:  Negative for cough, shortness of breath, wheezing and stridor.   Cardiovascular:  Negative for chest pain, palpitations and leg swelling.  Gastrointestinal:  Positive for diarrhea (loose but not frequent). Negative for abdominal distention, abdominal pain and constipation.  Genitourinary:  Negative for difficulty urinating and dysuria.  Musculoskeletal:  Positive for gait problem. Negative for arthralgias, back pain, joint swelling and myalgias.  Skin:  Positive for color change.  Neurological:  Negative for dizziness (when standing up quickly), seizures,  syncope, facial asymmetry, speech difficulty, weakness and headaches.  Hematological:  Negative for adenopathy. Does not bruise/bleed easily.  Psychiatric/Behavioral:  Negative for agitation, behavioral problems and confusion.    Health Maintenance  Topic Date Due   Zoster Vaccines- Shingrix (1 of 2) Never done   COVID-19 Vaccine (4 - Booster) 04/18/2020   INFLUENZA VACCINE  10/01/2020   TETANUS/TDAP  05/08/2030   PNA vac Low Risk Adult  Completed   HPV VACCINES  Aged Out    Physical Exam: Vitals:   09/17/20 1502  BP: 108/70  Pulse: 77  Temp: 97.9 F (36.6 C)  SpO2: 98%  Weight: 148 lb 12.8 oz (67.5 kg)  Height: 5\' 11"  (1.803 m)   Body mass index is 20.75 kg/m. Wt Readings from Last 3 Encounters:  09/17/20 148 lb 12.8 oz (67.5 kg)  09/13/20 149 lb (67.6 kg)  09/06/20 149 lb 14.4 oz (68 kg)  Physical Exam Vitals and nursing note reviewed.  Constitutional:      General: He is not in acute distress.    Appearance: He is not diaphoretic.  HENT:     Head: Normocephalic and atraumatic.     Right Ear: Tympanic membrane, ear canal and external ear normal.     Left Ear: Tympanic membrane, ear canal and external ear normal.     Nose: Nose normal. No congestion.     Mouth/Throat:     Mouth: Mucous membranes are moist.     Pharynx: Oropharynx is clear. No oropharyngeal exudate.  Eyes:     General:        Right eye: No discharge.        Left eye: No discharge.     Conjunctiva/sclera: Conjunctivae normal.     Pupils: Pupils are equal, round, and reactive to light.  Neck:     Thyroid: No thyromegaly.     Vascular: No JVD.     Trachea: No tracheal deviation.  Cardiovascular:     Rate and Rhythm: Normal rate and regular rhythm.     Heart sounds: Murmur heard.  Pulmonary:     Effort: Pulmonary effort is normal. No respiratory distress.     Breath sounds: Normal breath sounds. No wheezing.  Abdominal:     General: Bowel sounds are normal. There is no distension (rotund).      Palpations: Abdomen is soft.     Tenderness: There is no abdominal tenderness.  Musculoskeletal:        General: No swelling, tenderness, deformity or signs of injury.     Cervical back: Normal range of motion and neck supple.     Right lower leg: No edema.     Left lower leg: No edema.  Lymphadenopathy:     Cervical: No cervical adenopathy.  Skin:    General: Skin is warm and dry.     Coloration: Skin is pale.     Findings: Bruising (legs) present.  Neurological:     Mental Status: He is alert and oriented to person, place, and time.     Cranial Nerves: No cranial nerve deficit.     Comments: Unsteady gait  Psychiatric:        Mood and Affect: Mood normal.    Labs reviewed: Basic Metabolic Panel: Recent Labs    06/24/20 1217 06/27/20 0000 07/05/20 1528 07/11/20 1120 09/05/20 1430 09/13/20 1209  NA 129*   < > 132* 137  --  132*  K 3.8   < > 4.6 4.2  --  4.7  CL 96*   < > 98* 101  --  98  CO2 27   < > 27* 28  --  27  GLUCOSE 133*  --   --  91  --  88  BUN 14   < > 16 12  --  12  CREATININE 0.60*   < > 0.9 0.81 0.70 0.81  CALCIUM 8.8*   < > 9.0 9.5  --  9.4   < > = values in this interval not displayed.   Liver Function Tests: Recent Labs    06/24/20 1217 06/27/20 0000 07/05/20 1528 07/11/20 1120 09/13/20 1209  AST 12*   < > 14 17 13*  ALT 8   < > 9* 8 9  ALKPHOS 66   < > 68 86 85  BILITOT 0.4  --   --  0.4 0.4  PROT 6.2*  --   --  7.0 6.9  ALBUMIN 3.7   < > 3.7 3.5 3.4*   < > = values in this interval not displayed.   No results for input(s): LIPASE, AMYLASE in the last 8760 hours. No results for input(s): AMMONIA in the last 8760 hours. CBC: Recent Labs    07/05/20 1528 07/11/20 1120 08/23/20 0935 09/13/20 1209  WBC 6.5 5.7 6.6 6.3  NEUTROABS 4.60 4.1  --  4.5  HGB 8.1* 8.1* 8.6* 7.7*  HCT 26* 25.7*  27.1* 27.9* 25.0*  MCV  --  81.8 81.6 78.6*  PLT 400* 407* 353 340   Lipid Panel: No results for input(s): CHOL, HDL, LDLCALC, TRIG,  CHOLHDL, LDLDIRECT in the last 8760 hours. Lab Results  Component Value Date   HGBA1C 5.9 03/09/2012    Procedures since last visit: MR Pelvis Wo Contrast  Addendum Date: 09/17/2020   ADDENDUM REPORT: 09/17/2020 09:08 ADDENDUM: Additional review shows a 9 mm lymph node in the high perirectal space on image 11/3, and a 10 mm extra-mesorectal lymph node is seen in the left internal iliac chain on image 7/7. This changes the N stage by imaging from N0 to N2. Electronically Signed   By: Marlaine Hind M.D.   On: 09/17/2020 09:08   Result Date: 09/17/2020 CLINICAL DATA:  Newly diagnosed rectal adenocarcinoma. EXAM: MRI PELVIS WITHOUT CONTRAST TECHNIQUE: Multiplanar multisequence MR imaging of the pelvis was performed. No intravenous contrast was administered. Ultrasound gel was administered per rectum to optimize tumor evaluation. COMPARISON:  None. FINDINGS: TUMOR LOCATION Tumor distance from Anal Verge/Skin Surface:  2.5 cm Tumor distance to Internal Anal Sphincter: 0 cm, tumor is seen involving the upper anal canal TUMOR DESCRIPTION Circumferential Extent: 100% Tumor Length: 6.7 cm T - CATEGORY Extension through Muscularis Propria: Yes, along the left anterolateral wall measuring up to 5 (image 31/7) = T3b Shortest Distance of any tumor/node from Mesorectal Fascia: 14 mm Extramural Vascular Invasion/Tumor Thrombus: No Invasion of Anterior Peritoneal Reflection: No Involvement of Adjacent Organs or Pelvic Sidewall: No Levator Ani Involvement: No N - CATEGORY Mesorectal Lymph Nodes >=53mm: None=N0 Extra-mesorectal Lymphadenopathy: No Other:  None. IMPRESSION: Rectal adenocarcinoma T stage: T3b Rectal adenocarcinoma N stage: N0 Distance from tumor to internal anal sphincter is 0 cm; tumor involves the upper anal sphincter. Electronically Signed: By: Marlaine Hind M.D. On: 09/06/2020 13:06   CT CHEST ABDOMEN PELVIS W CONTRAST  Result Date: 09/06/2020 CLINICAL DATA:  Rectal cancer staging EXAM: CT CHEST, ABDOMEN,  AND PELVIS WITH CONTRAST TECHNIQUE: Multidetector CT imaging of the chest, abdomen and pelvis was performed following the standard protocol during bolus administration of intravenous contrast. CONTRAST:  16mL OMNIPAQUE IOHEXOL 300 MG/ML SOLN, additional oral enteric contrast COMPARISON:  CT abdomen pelvis, 01/10/2020 FINDINGS: CT CHEST FINDINGS Cardiovascular: Aortic atherosclerosis. Normal heart size. Left and right coronary artery calcifications no pericardial effusion. Mediastinum/Nodes: No enlarged mediastinal, hilar, or axillary lymph nodes. Small hiatal hernia. Thyroid gland, trachea, and esophagus demonstrate no significant findings. Lungs/Pleura: Lungs are clear. No pleural effusion or pneumothorax. Musculoskeletal: No chest wall mass or suspicious bone lesions identified. CT ABDOMEN PELVIS FINDINGS Hepatobiliary: No solid liver abnormality is seen. Ill-defined low-attenuation lesion of the right lobe of the liver is unchanged compared to prior examination and incompletely characterized although most likely a small hemangioma, measuring approximately 1.8 x 1.1 cm (series 2, image 58). No gallstones, gallbladder wall thickening, or biliary dilatation. Pancreas: Unremarkable. No pancreatic ductal dilatation or surrounding inflammatory changes. Spleen: Normal in size without significant abnormality. Adrenals/Urinary Tract:  Adrenal glands are unremarkable. Kidneys are normal, without renal calculi, solid lesion, or hydronephrosis. Bladder is unremarkable. Stomach/Bowel: Stomach is within normal limits. Appendix is not clearly visualized. Large burden of stool throughout the colon. Descending and sigmoid diverticulosis. Circumferential thickening of the low rectum, a segment approximately 6 cm in length to the anal verge Vascular/Lymphatic: Aortic atherosclerosis. Enlarged left pelvic sidewall lymph node measuring up to 1.3 x 1.1 cm, not significantly changed compared to prior examination (series 2, image 106).  Reproductive: Prostatomegaly. Other: No abdominal wall hernia or abnormality. No abdominopelvic ascites. Musculoskeletal: No acute or significant osseous findings. IMPRESSION: 1. Circumferential thickening of the low rectum, a segment approximately 6 cm in length to the anal verge, consistent with primary rectal malignancy. Please see forthcoming MR examination of the pelvis for staging. 2. Enlarged left pelvic sidewall lymph node measuring up to 1.3 x 1.1 cm, not significantly changed compared to prior examination. This is suspicious for nodal metastatic disease. 3. No evidence of distant metastatic disease in the chest, abdomen, or pelvis. 4. Ill-defined low-attenuation lesion of the right lobe of the liver is unchanged compared to prior examination and incompletely characterized, although most likely a small hemangioma, measuring approximately 1.8 x 1.1 cm. This could be further characterized by contrast enhanced MR if desired. 5. Prostatomegaly. 6. Coronary artery disease. Aortic Atherosclerosis (ICD10-I70.0). Electronically Signed   By: Eddie Candle M.D.   On: 09/06/2020 12:18    Assessment/Plan 1. Gait instability Folding walker prescription given   2. Rectal adenocarcinoma (HCC) Stage 3b F/U later this week with oncology for treatment plan At 87 years old he would have previously not wanted aggressive treatment but now is reconsidering this.   3. Iron deficiency anemia due to chronic blood loss Hgb trending downward  Iron infusions per hem/onc F/u later this week   4. Hiatal hernia With gerd Continue nexium   Total time 105min:  time greater than 50% of total time spent doing pt counseling and coordination of care    Labs/tests ordered:  labs will be done at the cancer center.  Next appt:  4 months with Dr. Lyndel Safe

## 2020-09-18 ENCOUNTER — Ambulatory Visit: Payer: Medicare PPO | Admitting: Neurology

## 2020-09-19 ENCOUNTER — Ambulatory Visit: Payer: Medicare PPO | Admitting: Hematology and Oncology

## 2020-09-20 ENCOUNTER — Encounter: Payer: Self-pay | Admitting: Hematology and Oncology

## 2020-09-20 ENCOUNTER — Other Ambulatory Visit: Payer: Self-pay

## 2020-09-20 ENCOUNTER — Inpatient Hospital Stay: Payer: Medicare PPO

## 2020-09-20 ENCOUNTER — Inpatient Hospital Stay: Payer: Medicare PPO | Admitting: Hematology and Oncology

## 2020-09-20 VITALS — BP 144/67 | HR 79 | Temp 98.4°F | Resp 18 | Wt 148.8 lb

## 2020-09-20 DIAGNOSIS — C20 Malignant neoplasm of rectum: Secondary | ICD-10-CM

## 2020-09-20 MED ORDER — PROCHLORPERAZINE MALEATE 10 MG PO TABS: 10.0000 mg | ORAL_TABLET | Freq: Four times a day (QID) | ORAL | 1 refills | Status: DC | PRN

## 2020-09-20 MED ORDER — DEXAMETHASONE 4 MG PO TABS: 8.0000 mg | ORAL_TABLET | Freq: Every day | ORAL | 1 refills | Status: DC

## 2020-09-20 MED ORDER — ONDANSETRON HCL 8 MG PO TABS: 8.0000 mg | ORAL_TABLET | Freq: Two times a day (BID) | ORAL | 1 refills | Status: DC | PRN

## 2020-09-20 MED ORDER — LIDOCAINE-PRILOCAINE 2.5-2.5 % EX CREA: TOPICAL_CREAM | CUTANEOUS | 3 refills | Status: DC

## 2020-09-20 NOTE — Progress Notes (Signed)
[facility] NOTE  Patient Care Team: [name], MD as PCP - General (Internal Medicine) Community, [facility], MD as Consulting Physician (Ophthalmology) Benay Pike, MD as Consulting Physician (Hematology and Oncology)  CHIEF COMPLAINTS/PURPOSE OF CONSULTATION:  Consult for anemia.  ASSESSMENT & PLAN:   Adenocarcinoma rectum, synchronous tumors, T3N1, MSI high in one mass, MSS in second mass  This is a very pleasant 87 year old male patient who was initially referred to [place] for evaluation of iron deficiency anemia now has a colonoscopy which showed a rectal mass with biopsy confirming invasive adenocarcinoma, currently staged as T3N1  , addended staging, MR liver with no evidence of metastatic disease who is here for FU. He had been discussed in the GI tumor board.  He appears to have 2 synchronous tumors in the mid rectum and the distal rectum with one of them showing microsatellite instability suggesting response to immunotherapy.  The second tumor has intact mismatch repair protein. He has met with all disciplines including radiation oncology and surgical oncology.  At this time the surgeons do not recommend surgery given his age and risk perioperatively.  Hence the plan was to consider neoadjuvant chemoimmunotherapy (this is not quite by the book however given his age and to avoid a doublet chemo and also since his large tumor has microsatellite instability, we will her we agreed to try 5-fluorouracil with immunotherapy for a couple cycles and reevaluate).  I have discussed this in very much detail today with the patient and his daughter.  I have discussed about adverse effects of chemotherapy including but not limited to fatigue, nausea, vomiting, diarrhea, increased risk of infections, coronary vasospasm, severe toxicity in patients with DPD deficiency.  We have also discussed about mechanism of action of immunotherapy and adverse  effects from immunotherapy including but not limited to fatigue, dry skin, diarrhea, hypo-/hyperthyroidism and about 1 to 2% of the people have severe fatal side effects.   They understand that immunotherapy can virtually attack any organ and can lead to fatal organ failure.  He is however willing to try as recommended and follow-up.  We have talked about a port placement, he is in agreement with that and this has been ordered.  Once chemo is authorized, we will proceed with 5-fluorouracil and Opdivo every 2 weeks for 2 cycles and repeat imaging.  Iron deficiency anemia secondary to the tumor resulting in worsening fatigue.  We will proceed with intravenous iron infusions, he tolerated this very well in the past and is willing to do this again.  Thank you for consulting [place] in the care of this patient.  Please not hesitate to contact us with any additional questions or concerns.   HISTORY OF PRESENTING ILLNESS:   Alec Weiss 87 y.o. male is here because of new diagnosis of rectal cancer.  Alec Weiss is a very pleasant 87 year old male patient with past medical history significant for retinal detachment and eye surgery, chronic low back pain referred to hematology for evaluation and recommendations regarding his anemia.    During his initial visit, we discussed about IV iron infusion, GI evaluation and further evaluation of anemia. Colonoscopy showed malignant partially obstructing tumor in the mid rectum as well as distal rectum. Biopsies confirmed invasive adenocarcinoma, intact nuclear expression. He is now here for follow-up after new diagnosis of adenocarcinoma of the rectum.  Since his last visit, I have briefly spoken to him on the phone discussing the pathology results and the role  for imaging.  He also had CT chest abdomen pelvis and MRI pelvis yesterday and is here to discuss the results. CT imaging showed circumferential thickening of the lower rectum, a segment approximately 6 cm in  length to the anal verge consistent with primary rectal malignancy, enlarged left pelvic sidewall lymph node measuring up to 1.3 x 1.1 cm not significantly changed compared to prior exam.  No other evidence of distant metastatic disease, ill-defined low-attenuation lesion of the right lobe of the liver is unchanged compared to prior exam and almost most likely a small hemangioma measuring approximately 1.8 x 1.1 cm. MRI pelvis confirmed staging to be T3N0. MR pelvis staging addended to T3N2 given concerning LN appearance on imaging review MR liver with no mets.  Pathology from rectal biopsy showed invasive adenocarcinoma in 2 separate masses.  Mass A did not show any evidence of mismatch repair however mass B showed evidence of MLH1 and PMS2 loss.  He is here for FU. Since last visit, he met Dr Lisbeth Renshaw and surgical oncology team He is very pleased that we are all working together to take care of him. He is hoping to get iron soon. No other concerning symptoms for me today  MEDICAL HISTORY:  Past Medical History:  Diagnosis Date   Actinic keratosis 04/05/2012   Insomnia, unspecified 06/08/2008   Lumbago 01/02/2003   Neoplasm of uncertain behavior of skin 04/05/2012   Pain in limb 02/05/2011   Retinal detachment 2000   left eye   Screening cholesterol level    Shortness of breath 09/07/2008   Vitamin D deficiency 08/04/2012    SURGICAL HISTORY: Past Surgical History:  Procedure Laterality Date   BIOPSY  08/23/2020   Procedure: BIOPSY;  Surgeon: Doran Stabler, MD;  Location: [facility];  Service: Gastroenterology;;   COLONOSCOPY WITH PROPOFOL N/A 08/23/2020   Procedure: COLONOSCOPY WITH PROPOFOL;  Surgeon: Doran Stabler, MD;  Location: [facility];  Service: Gastroenterology;  Laterality: N/A;   ESOPHAGOGASTRODUODENOSCOPY (EGD) WITH PROPOFOL N/A 08/23/2020   Procedure: ESOPHAGOGASTRODUODENOSCOPY (EGD) WITH PROPOFOL;  Surgeon: Doran Stabler, MD;  Location: [facility];  Service:  Gastroenterology;  Laterality: N/A;   EXTERNAL EAR SURGERY  05/2012   left ear    INGUINAL HERNIA REPAIR Right 01/21/2005   Dr. [name]   LID LESION EXCISION  2009   MOHS SURGERY Left 05/28/2012   ear lobe Dr. [name]   RETINAL DETACHMENT SURGERY Left 1997   [facility]   SUBMUCOSAL INJECTION  08/23/2020   Procedure: SUBMUCOSAL INJECTION;  Surgeon: Doran Stabler, MD;  Location: [facility];  Service: Gastroenterology;;    SOCIAL HISTORY: Social History   Socioeconomic History   Marital status: Widowed    Spouse name: Not on file   Number of children: Not on file   Years of education: Not on file   Highest education level: Not on file  Occupational History   Occupation: retired Hydrologist professor  Tobacco Use   Smoking status: Never   Smokeless tobacco: Never  Substance and Sexual Activity   Alcohol use: Yes    Alcohol/week: 1.0 standard drink    Types: 1 Glasses of wine per week    Comment: at dinner every night.   Drug use: No   Sexual activity: Never  Other Topics Concern   Not on file  Social History Narrative   Patient is  Married [name]) since 1958--widowed a few years ago (updating 2021). Retired professor, Hydrologist. Lives in apartment, Independent  Living section at [facility] since 02/2012.       No Smoking history, drinks moderate amount of alcohol    Patient has Advanced planning documents: DNR   Exercise walking - daily ~1 1/2 - 2 miles (~ 1hr daily) as well as [employer] Exercise classes.   Social Determinants of Health   Financial Resource Strain: Not on file  Food Insecurity: Not on file  Transportation Needs: Not on file  Physical Activity: Not on file  Stress: Not on file  Social Connections: Not on file  Intimate Partner Violence: Not on file    FAMILY HISTORY: Family History  Problem Relation Age of Onset   Cancer Mother        breast    ALLERGIES:  is allergic to benzalkonium chloride, maxitrol  [neomycin-polymyxin-dexameth], and neosporin [neomycin-bacitracin zn-polymyx].  MEDICATIONS:  Current Outpatient Medications  Medication Sig Dispense Refill   clotrimazole (LOTRIMIN) 1 % cream Apply 1 application topically 2 (two) times daily. To left knee for two dry scaly areas until resolved (Patient taking differently: Apply 1 application topically 2 (two) times daily as needed (To left knee for two dry scaly areas until resolved).) 40 g 0   esomeprazole (NEXIUM) 40 MG capsule Take 1 capsule (40 mg total) by mouth daily. 30 capsule 3   iron polysaccharides (NU-IRON) 150 MG capsule Take 1 capsule (150 mg total) by mouth daily. 30 capsule 3   Polyethyl Glycol-Propyl Glycol (SYSTANE) 0.4-0.3 % SOLN Place 1 drop into both eyes daily as needed (dry eyes).     vitamin B-12 (CYANOCOBALAMIN) 1000 MCG tablet Take 1 Tablet Daily 100 tablet 1   Vitamin D, Ergocalciferol, (DRISDOL) 1.25 MG (50000 UNIT) CAPS capsule TAKE ONE CAPSULE ONCE A WEEK FOR VITAMIN D SUPPLEMENT. (Patient taking differently: Take 50,000 Units by mouth every Tuesday. TAKE ONE CAPSULE ONCE A WEEK FOR VITAMIN D SUPPLEMENT.) 4 capsule 1   No current facility-administered medications for this visit.     PHYSICAL EXAMINATION: ECOG PERFORMANCE STATUS: 1 - Symptomatic but completely ambulatory  Vitals:   09/20/20 1228  BP: (!) 144/67  Pulse: 79  Resp: 18  Temp: 98.4 F (36.9 C)  SpO2: 98%   Filed Weights   09/20/20 1228  Weight: 148 lb 12.8 oz (67.5 kg)   PE deferred today in lieu of counseling.  LABORATORY DATA:  I have reviewed the data as listed Lab Results  Component Value Date   WBC 6.3 09/13/2020   HGB 7.7 (L) 09/13/2020   HCT 25.0 (L) 09/13/2020   MCV 78.6 (L) 09/13/2020   PLT 340 09/13/2020     Chemistry      Component Value Date/Time   NA 132 (L) 09/13/2020 1209   NA 132 (A) 07/05/2020 1528   K 4.7 09/13/2020 1209   CL 98 09/13/2020 1209   CO2 27 09/13/2020 1209   BUN 12 09/13/2020 1209   BUN 16  07/05/2020 1528   CREATININE 0.81 09/13/2020 1209   CREATININE 0.81 07/11/2020 1120   GLU 105 07/05/2020 1528      Component Value Date/Time   CALCIUM 9.4 09/13/2020 1209   ALKPHOS 85 09/13/2020 1209   AST 13 (L) 09/13/2020 1209   AST 17 07/11/2020 1120   ALT 9 09/13/2020 1209   ALT 8 07/11/2020 1120   BILITOT 0.4 09/13/2020 1209   BILITOT 0.4 07/11/2020 1120       RADIOGRAPHIC STUDIES: I have personally reviewed the radiological images as listed and agreed with the findings  in the report. MR Pelvis Wo Contrast  Addendum Date: 09/17/2020   ADDENDUM REPORT: 09/17/2020 09:08 ADDENDUM: Additional review shows a 9 mm lymph node in the high perirectal space on image 11/3, and a 10 mm extra-mesorectal lymph node is seen in the left internal iliac chain on image 7/7. This changes the N stage by imaging from N0 to N2. Electronically Signed   By: Marlaine Hind M.D.   On: 09/17/2020 09:08   Result Date: 09/17/2020 CLINICAL DATA:  Newly diagnosed rectal adenocarcinoma. EXAM: MRI PELVIS WITHOUT CONTRAST TECHNIQUE: Multiplanar multisequence MR imaging of the pelvis was performed. No intravenous contrast was administered. Ultrasound gel was administered per rectum to optimize tumor evaluation. COMPARISON:  None. FINDINGS: TUMOR LOCATION Tumor distance from Anal Verge/Skin Surface:  2.5 cm Tumor distance to Internal Anal Sphincter: 0 cm, tumor is seen involving the upper anal canal TUMOR DESCRIPTION Circumferential Extent: 100% Tumor Length: 6.7 cm T - CATEGORY Extension through Muscularis Propria: Yes, along the left anterolateral wall measuring up to 5 (image 31/7) = T3b Shortest Distance of any tumor/node from Mesorectal Fascia: 14 mm Extramural Vascular Invasion/Tumor Thrombus: No Invasion of Anterior Peritoneal Reflection: No Involvement of Adjacent Organs or Pelvic Sidewall: No Levator Ani Involvement: No N - CATEGORY Mesorectal Lymph Nodes >=76mm: None=N0 Extra-mesorectal Lymphadenopathy: No Other:   None. IMPRESSION: Rectal adenocarcinoma T stage: T3b Rectal adenocarcinoma N stage: N0 Distance from tumor to internal anal sphincter is 0 cm; tumor involves the upper anal sphincter. Electronically Signed: By: Marlaine Hind M.D. On: 09/06/2020 13:06   MR LIVER W WO CONTRAST  Result Date: 09/18/2020 CLINICAL DATA:  Liver lesion, history of rectal cancer. EXAM: MRI ABDOMEN WITHOUT AND WITH CONTRAST TECHNIQUE: Multiplanar multisequence MR imaging of the abdomen was performed both before and after the administration of intravenous contrast. CONTRAST:  64mL GADAVIST GADOBUTROL 1 MMOL/ML IV SOLN COMPARISON:  CT scan 09/05/2020 and 01/10/2020 FINDINGS: Lower chest: Unremarkable Hepatobiliary: 1.0 by 0.8 cm lesion in the dome of the liver on image 26 series 19 has high T2 and low T1 signal characteristics and peripheral centripetal nodular enhancement characteristic of hepatic hemangioma, along with delayed enhancement. Similarly, a 2.0 by 1.1 cm lesion in the right hepatic lobe on image 32 of series 19 corresponding to the lesion seen at CT has high T2 and low T1 signal characteristics with peripheral centripetal discontinuous enhancement and delayed enhancement compatible with hepatic hemangioma. A 0.6 cm lesion in the dome of the right hepatic lobe on image 22 series 19 demonstrates delayed enhancement characteristic of hemangioma. On image 13 series 4, a small T2 hyperintense lesion anteriorly in the left hepatic lobe is observed. This is along the dome of the liver and subject to susceptibility artifact. This lesion is difficult to appreciate on the other sequences but probably a tiny hemangioma. Multiple gallstones are present in the gallbladder including a dominant 4.6 cm in length stone as well as smaller stones. No significant biliary dilatation. Pancreas:  Unremarkable Spleen:  Unremarkable Adrenals/Urinary Tract: Small simple cyst of the right kidney upper pole, 0.9 cm in diameter. The adrenal glands appear  unremarkable. Stomach/Bowel: The patient has a known rectal cancer which is not included on most of the sequences today. Sigmoid colon diverticulosis. Small type 1 hiatal hernia. Vascular/Lymphatic: Aortoiliac atherosclerotic vascular disease. No upper abdominal adenopathy. Other:  Prominence of stool in the colon. Musculoskeletal: Lumbar spondylosis and degenerative disc disease. IMPRESSION: 1. The lesion of concern in the right hepatic lobe is a benign hemangioma, as  are 2 other nearby lesions in the liver. A small fourth lesion is present anteriorly in the left hepatic lobe and occult on most of the series, faintly T2 hyperintense on a single series, probably also a small hemangioma, but technically nonspecific due to small size and indistinctness. Surveillance suggested. 2. Cholelithiasis. 3. Small type 1 hiatal hernia. 4.  Aortic Atherosclerosis (ICD10-I70.0). 5. Prominence of stool in the colon. Electronically Signed   By: Van Clines M.D.   On: 09/18/2020 13:04   CT CHEST ABDOMEN PELVIS W CONTRAST  Result Date: 09/06/2020 CLINICAL DATA:  Rectal cancer staging EXAM: CT CHEST, ABDOMEN, AND PELVIS WITH CONTRAST TECHNIQUE: Multidetector CT imaging of the chest, abdomen and pelvis was performed following the standard protocol during bolus administration of intravenous contrast. CONTRAST:  123mL OMNIPAQUE IOHEXOL 300 MG/ML SOLN, additional oral enteric contrast COMPARISON:  CT abdomen pelvis, 01/10/2020 FINDINGS: CT CHEST FINDINGS Cardiovascular: Aortic atherosclerosis. Normal heart size. Left and right coronary artery calcifications no pericardial effusion. Mediastinum/Nodes: No enlarged mediastinal, hilar, or axillary lymph nodes. Small hiatal hernia. Thyroid gland, trachea, and esophagus demonstrate no significant findings. Lungs/Pleura: Lungs are clear. No pleural effusion or pneumothorax. Musculoskeletal: No chest wall mass or suspicious bone lesions identified. CT ABDOMEN PELVIS FINDINGS  Hepatobiliary: No solid liver abnormality is seen. Ill-defined low-attenuation lesion of the right lobe of the liver is unchanged compared to prior examination and incompletely characterized although most likely a small hemangioma, measuring approximately 1.8 x 1.1 cm (series 2, image 58). No gallstones, gallbladder wall thickening, or biliary dilatation. Pancreas: Unremarkable. No pancreatic ductal dilatation or surrounding inflammatory changes. Spleen: Normal in size without significant abnormality. Adrenals/Urinary Tract: Adrenal glands are unremarkable. Kidneys are normal, without renal calculi, solid lesion, or hydronephrosis. Bladder is unremarkable. Stomach/Bowel: Stomach is within normal limits. Appendix is not clearly visualized. Large burden of stool throughout the colon. Descending and sigmoid diverticulosis. Circumferential thickening of the low rectum, a segment approximately 6 cm in length to the anal verge Vascular/Lymphatic: Aortic atherosclerosis. Enlarged left pelvic sidewall lymph node measuring up to 1.3 x 1.1 cm, not significantly changed compared to prior examination (series 2, image 106). Reproductive: Prostatomegaly. Other: No abdominal wall hernia or abnormality. No abdominopelvic ascites. Musculoskeletal: No acute or significant osseous findings. IMPRESSION: 1. Circumferential thickening of the low rectum, a segment approximately 6 cm in length to the anal verge, consistent with primary rectal malignancy. Please see forthcoming MR examination of the pelvis for staging. 2. Enlarged left pelvic sidewall lymph node measuring up to 1.3 x 1.1 cm, not significantly changed compared to prior examination. This is suspicious for nodal metastatic disease. 3. No evidence of distant metastatic disease in the chest, abdomen, or pelvis. 4. Ill-defined low-attenuation lesion of the right lobe of the liver is unchanged compared to prior examination and incompletely characterized, although most likely a  small hemangioma, measuring approximately 1.8 x 1.1 cm. This could be further characterized by contrast enhanced MR if desired. 5. Prostatomegaly. 6. Coronary artery disease. Aortic Atherosclerosis (ICD10-I70.0). Electronically Signed   By: Eddie Candle M.D.   On: 09/06/2020 12:18     All questions were answered. The patient knows to call the clinic with any problems, questions or concerns. I spent 40 minutes in the care of this patient including discussion with other disciplines,pharmacy, coordinating chemo/immunotherapy plan of care, discussing adverse effects, counseling and coordination of care.   Benay Pike, MD 09/20/2020 12:37 PM

## 2020-09-20 NOTE — Progress Notes (Signed)
START OFF PATHWAY REGIMEN - Colorectal   [id number] (Leucovorin IV D1 + Fluorouracil IV D1/CIV D1,2 + Oxaliplatin IV D1) q14 Days:   A cycle is every 14 days:     Oxaliplatin      Leucovorin      Fluorouracil      Fluorouracil   **Always confirm dose/schedule in your pharmacy ordering system**  Patient Characteristics: Preoperative or Nonsurgical Candidate (Clinical Staging), Rectal, cT3 - cT4, cN0 or Any cT, cN+ Tumor Location: Rectal Therapeutic Status: Preoperative or Nonsurgical Candidate (Clinical Staging) AJCC T Category: cT3 AJCC N Category: cN1 AJCC M Category: cM0 AJCC 8 Stage Grouping: IIIB Intent of Therapy: Non-Curative / Palliative Intent, Discussed with Patient

## 2020-09-20 NOTE — Progress Notes (Signed)
DISCONTINUE OFF PATHWAY REGIMEN - Colorectal   [id number] (Leucovorin IV D1 + Fluorouracil IV D1/CIV D1,2 + Oxaliplatin IV D1) q14 Days:   A cycle is every 14 days:     Oxaliplatin      Leucovorin      Fluorouracil      Fluorouracil   **Always confirm dose/schedule in your pharmacy ordering system**  REASON: Other Reason PRIOR TREATMENT: Off Pathway: [id number] (Leucovorin IV D1 + Fluorouracil IV D1/CIV D1,2 + Oxaliplatin IV D1) q14 Days TREATMENT RESPONSE: Unable to Evaluate  START OFF PATHWAY REGIMEN - Colorectal   [id number] q14 Days + Nivolumab 240 mg IV D1 q14 Days:   A cycle is every 14 days:     Nivolumab      Oxaliplatin      Leucovorin      Fluorouracil      Fluorouracil   **Always confirm dose/schedule in your pharmacy ordering system**  Patient Characteristics: Preoperative or Nonsurgical Candidate (Clinical Staging), Rectal, cT3 - cT4, cN0 or Any cT, cN+ Tumor Location: Rectal Therapeutic Status: Preoperative or Nonsurgical Candidate (Clinical Staging) AJCC T Category: cT3 AJCC N Category: cN1 AJCC M Category: cM0 AJCC 8 Stage Grouping: IIIB Intent of Therapy: Non-Curative / Palliative Intent, Discussed with Patient

## 2020-09-20 NOTE — Addendum Note (Signed)
Addended by: Adaline Sill on: 09/20/2020 02:58 PM   Modules accepted: Orders

## 2020-09-21 ENCOUNTER — Encounter: Payer: Self-pay | Admitting: Hematology and Oncology

## 2020-09-23 ENCOUNTER — Encounter: Payer: Self-pay | Admitting: Hematology and Oncology

## 2020-09-24 NOTE — Progress Notes (Signed)
Pharmacist Chemotherapy Monitoring - Initial Assessment    Anticipated start date: 10/01/20   The following has been reviewed per standard work regarding the patient's treatment regimen: The patient's diagnosis, treatment plan and drug doses, and organ/hematologic function Lab orders and baseline tests specific to treatment regimen  The treatment plan start date, drug sequencing, and pre-medications Prior authorization status  Patient's documented medication list, including drug-drug interaction screen and prescriptions for anti-emetics and supportive care specific to the treatment regimen The drug concentrations, fluid compatibility, administration routes, and timing of the medications to be used The patient's access for treatment and lifetime cumulative dose history, if applicable  The patient's medication allergies and previous infusion related reactions, if applicable   Changes made to treatment plan:  treatment plan date  Follow up needed:  Pending authorization for treatment  Need pump d/c appt sched.    Kennith Center, Pharm.D., CPP 09/24/2020'@1'$ :25 PM

## 2020-09-25 ENCOUNTER — Encounter: Payer: Self-pay | Admitting: Hematology and Oncology

## 2020-09-25 ENCOUNTER — Other Ambulatory Visit: Payer: Medicare PPO

## 2020-09-26 ENCOUNTER — Other Ambulatory Visit: Payer: Self-pay

## 2020-09-26 ENCOUNTER — Inpatient Hospital Stay: Payer: Medicare PPO

## 2020-09-26 ENCOUNTER — Encounter: Payer: Self-pay | Admitting: Hematology and Oncology

## 2020-09-26 ENCOUNTER — Telehealth: Payer: Self-pay

## 2020-09-26 VITALS — BP 141/63 | HR 70 | Temp 97.6°F | Resp 18

## 2020-09-26 DIAGNOSIS — D509 Iron deficiency anemia, unspecified: Secondary | ICD-10-CM

## 2020-09-26 DIAGNOSIS — C20 Malignant neoplasm of rectum: Secondary | ICD-10-CM | POA: Diagnosis not present

## 2020-09-26 MED ORDER — SODIUM CHLORIDE 0.9 % IV SOLN
Freq: Once | INTRAVENOUS | Status: AC
  Filled 2020-09-26: qty 250

## 2020-09-26 MED ORDER — SODIUM CHLORIDE 0.9 % IV SOLN
200.0000 mg | Freq: Once | INTRAVENOUS | Status: AC
  Administered 2020-09-26: 200 mg via INTRAVENOUS
  Filled 2020-09-26: qty 200

## 2020-09-26 NOTE — Progress Notes (Signed)
I spoke with [name], Mr Towle's daughter.  I explained to her that Mr Fleener's chemotherapy education class appt had been cancelled and not rescheduled.  She found an appt that work with her scheduled and I made the appt.  She verbalized understandingl

## 2020-09-26 NOTE — Telephone Encounter (Signed)
Spoke with patient's daughter, [name], this morning to clarify questions regarding patient's schedule, treatment plan, and transportation. All questions answered and [name] had no further questions or concerns for nurse at the conclusion of phone call.

## 2020-09-26 NOTE — Patient Instructions (Signed)

## 2020-09-27 ENCOUNTER — Encounter: Payer: Medicare PPO | Admitting: Genetic Counselor

## 2020-09-27 ENCOUNTER — Inpatient Hospital Stay: Payer: Medicare PPO | Admitting: Genetic Counselor

## 2020-09-27 ENCOUNTER — Ambulatory Visit: Payer: Medicare PPO

## 2020-09-27 ENCOUNTER — Other Ambulatory Visit: Payer: Self-pay | Admitting: Genetic Counselor

## 2020-09-27 ENCOUNTER — Inpatient Hospital Stay: Payer: Medicare PPO

## 2020-09-27 DIAGNOSIS — C20 Malignant neoplasm of rectum: Secondary | ICD-10-CM

## 2020-09-27 DIAGNOSIS — Z803 Family history of malignant neoplasm of breast: Secondary | ICD-10-CM

## 2020-09-27 LAB — GENETIC SCREENING ORDER

## 2020-09-28 ENCOUNTER — Other Ambulatory Visit: Payer: Self-pay

## 2020-09-28 ENCOUNTER — Inpatient Hospital Stay: Payer: Medicare PPO

## 2020-09-28 ENCOUNTER — Encounter: Payer: Self-pay | Admitting: Hematology and Oncology

## 2020-09-28 ENCOUNTER — Encounter: Payer: Self-pay | Admitting: Genetic Counselor

## 2020-09-28 ENCOUNTER — Ambulatory Visit: Payer: Medicare PPO

## 2020-09-28 ENCOUNTER — Other Ambulatory Visit: Payer: Self-pay | Admitting: Hematology and Oncology

## 2020-09-28 VITALS — BP 139/65 | HR 65 | Temp 97.8°F | Resp 18

## 2020-09-28 DIAGNOSIS — C20 Malignant neoplasm of rectum: Secondary | ICD-10-CM | POA: Diagnosis not present

## 2020-09-28 DIAGNOSIS — D509 Iron deficiency anemia, unspecified: Secondary | ICD-10-CM

## 2020-09-28 MED ORDER — SODIUM CHLORIDE 0.9 % IV SOLN
Freq: Once | INTRAVENOUS | Status: AC
  Filled 2020-09-28: qty 250

## 2020-09-28 MED ORDER — SODIUM CHLORIDE 0.9 % IV SOLN
200.0000 mg | Freq: Once | INTRAVENOUS | Status: AC
  Administered 2020-09-28: 200 mg via INTRAVENOUS
  Filled 2020-09-28: qty 200

## 2020-09-28 NOTE — Progress Notes (Signed)
REFERRING PROVIDER: Benay Pike, MD [place],  [place] [id number]  PRIMARY PROVIDER:  [name], MD  PRIMARY REASON FOR VISIT:  1. Adenocarcinoma of rectum ([place])   2. Family history of breast cancer     HISTORY OF PRESENT ILLNESS:   Alec Weiss, a 87 y.o. male, was seen for a [place] cancer genetics consultation at the request of [name] due to a personal history of cancer.  Alec Weiss presents to clinic today to discuss the possibility of a hereditary predisposition to cancer, to discuss genetic testing, and to further clarify his future cancer risks, as well as potential cancer risks for family members.   In 2022, at the age of 56, Alec Weiss was diagnosed with adenocarcinoma of the rectum.  Two synchronous tumors of the mid and distal rectum were detected, one with microsatellite instability and loss of MLH1 and PMS2 by IHC.  The second tumor had intact mismatch repair protein.    CANCER HISTORY:  Oncology History  Iron deficiency anemia  Adenocarcinoma of rectum ([place])  09/06/2020 Initial Diagnosis   Adenocarcinoma of rectum ([place])    09/20/2020 Cancer Staging   Staging form: Colon and Rectum, AJCC 8th Edition - Clinical: Stage IIIB (cT3, cN1b, cM0) - Signed by Benay Pike, MD on 09/20/2020    09/26/2020 - 09/26/2020 Chemotherapy          10/01/2020 -  Chemotherapy    Patient is on Treatment Plan: RECTAL 5 FU+ NIVOLUMAB Q14D          Past Medical History:  Diagnosis Date   Actinic keratosis 04/05/2012   Insomnia, unspecified 06/08/2008   Lumbago 01/02/2003   Neoplasm of uncertain behavior of skin 04/05/2012   Pain in limb 02/05/2011   Retinal detachment 2000   left eye   Screening cholesterol level    Shortness of breath 09/07/2008   Vitamin D deficiency 08/04/2012    Past Surgical History:  Procedure Laterality Date   BIOPSY  08/23/2020   Procedure: BIOPSY;  Surgeon: Doran Stabler, MD;  Location: [facility];  Service: Gastroenterology;;    COLONOSCOPY WITH PROPOFOL N/A 08/23/2020   Procedure: COLONOSCOPY WITH PROPOFOL;  Surgeon: Doran Stabler, MD;  Location: [facility];  Service: Gastroenterology;  Laterality: N/A;   ESOPHAGOGASTRODUODENOSCOPY (EGD) WITH PROPOFOL N/A 08/23/2020   Procedure: ESOPHAGOGASTRODUODENOSCOPY (EGD) WITH PROPOFOL;  Surgeon: Doran Stabler, MD;  Location: [facility];  Service: Gastroenterology;  Laterality: N/A;   EXTERNAL EAR SURGERY  05/2012   left ear    INGUINAL HERNIA REPAIR Right 01/21/2005   Dr. [name]   LID LESION EXCISION  2009   MOHS SURGERY Left 05/28/2012   ear lobe Dr. [name]   RETINAL DETACHMENT SURGERY Left 1997   [facility]   SUBMUCOSAL INJECTION  08/23/2020   Procedure: SUBMUCOSAL INJECTION;  Surgeon: Doran Stabler, MD;  Location: [facility];  Service: Gastroenterology;;    Social History   Socioeconomic History   Marital status: Widowed    Spouse name: Not on file   Number of children: Not on file   Years of education: Not on file   Highest education level: Not on file  Occupational History   Occupation: retired Hydrologist professor  Tobacco Use   Smoking status: Never   Smokeless tobacco: Never  Substance and Sexual Activity   Alcohol use: Yes    Alcohol/week: 1.0 standard drink    Types: 1 Glasses of wine per week  Comment: at dinner every night.   Drug use: No   Sexual activity: Never  Other Topics Concern   Not on file  Social History Narrative   Patient is  Married [name]) since 1958--widowed a few years ago (updating 2021). Retired professor, Hydrologist. Lives in apartment, Independent Living section at [facility] since 02/2012.       No Smoking history, drinks moderate amount of alcohol    Patient has Advanced planning documents: DNR   Exercise walking - daily ~1 1/2 - 2 miles (~ 1hr daily) as well as [employer] Exercise classes.   Social Determinants of Health   Financial Resource Strain: Not on file  Food Insecurity: Not on  file  Transportation Needs: Not on file  Physical Activity: Not on file  Stress: Not on file  Social Connections: Not on file     FAMILY HISTORY:  We obtained a detailed, 4-generation family history.  Significant diagnoses are listed below: Family History  Problem Relation Age of Onset   Cancer Mother        Breast dx after 88    Alec Weiss is unaware of previous family history of genetic testing for hereditary cancer risks. There is no reported Ashkenazi Jewish ancestry. There is no known consanguinity.  GENETIC COUNSELING ASSESSMENT: Alec Weiss is a 87 y.o. male with a personal history of cancer which is somewhat suggestive of a hereditary cancer syndrome and predisposition to cancer given the presence of synchronous rectal tumors and abnormal mismatch repair IHC. We, therefore, discussed and recommended the following at today's visit.   DISCUSSION: We discussed that 5 - 10% of cancer is hereditary, with most cases of hereditary rectal associated with mutations in the Lynch syndromes genes.  There are other genes that can be associated with hereditary colorectal cancer syndromes.  We discussed that testing is beneficial for several reasons including knowing how to follow individuals for their cancer risks and understanding if other family members could be at risk for cancer and allowing them to undergo genetic testing.   We reviewed the characteristics, features and inheritance patterns of hereditary cancer syndromes. We also discussed genetic testing, including the appropriate family members to test, the process of testing, insurance coverage and turn-around-time for results. We discussed the implications of a negative, positive, carrier and/or variant of uncertain significant result. We recommended Alec Weiss pursue genetic testing for a panel that includes genes associated with rectal cancer cancer.   Alec Weiss  was offered a common hereditary cancer panel (47 genes) and an expanded  pan-cancer panel (84 genes). Alec Weiss was informed of the benefits and limitations of each panel, including that expanded pan-cancer panels contain genes that do not have clear management guidelines at this point in time.  We also discussed that as the number of genes included on a panel increases, the chances of variants of uncertain significance increases.  After considering the benefits and limitations of each gene panel, Alec Weiss  elected to have expandd pan-cancer panel through [facility].  The Multi-Cancer + RNA Panel offered by [facility] includes sequencing and/or deletion/duplication analysis of the following 84 genes:  AIP*, ALK, APC*, ATM*, AXIN2*, BAP1*, BARD1*, BLM*, BMPR1A*, BRCA1*, BRCA2*, BRIP1*, CASR, CDC73*, CDH1*, CDK4, CDKN1B*, CDKN1C*, CDKN2A, CEBPA, CHEK2*, CTNNA1*, DICER1*, DIS3L2*, EGFR, EPCAM, FH*, FLCN*, GATA2*, GPC3, GREM1, HOXB13, HRAS, KIT, MAX*, MEN1*, MET, MITF, MLH1*, MSH2*, MSH3*, MSH6*, MUTYH*, NBN*, NF1*, NF2*, NTHL1*, PALB2*, PDGFRA, PHOX2B, PMS2*, POLD1*, POLE*, POT1*, PRKAR1A*, PTCH1*, PTEN*, RAD50*, RAD51C*, RAD51D*, RB1*, RECQL4, RET,  RUNX1*, SDHA*, SDHAF2*, SDHB*, SDHC*, SDHD*, SMAD4*, SMARCA4*, SMARCB1*, SMARCE1*, STK11*, SUFU*, TERC, TERT, TMEM127*, Tp53*, TSC1*, TSC2*, VHL*, WRN*, and WT1.  RNA analysis is performed for * genes.   Based on Mr. Jay's personal history of cancer, he meets medical criteria for genetic testing. Despite that he meets criteria, he may still have an out of pocket cost. We discussed that if his out of pocket cost for testing is over $100, the laboratory will call and confirm whether he wants to proceed with testing.  If the out of pocket cost of testing is less than $100 he will be billed by the genetic testing laboratory.   PLAN: After considering the risks, benefits, and limitations, Mr. Finigan provided informed consent to pursue genetic testing and the blood sample was sent to [facility] for analysis of the Multi-Cancer +RNA Panel.  Results should be available within approximately 3 weeks' time, at which point they will be disclosed by telephone to Mr. Puzzo's daughter (per his request) as will any additional recommendations warranted by these results. Mr. Gratz will receive a summary of his genetic counseling visit and a copy of his results once available. This information will also be available in Epic.   Lastly, we encouraged Mr. Garriga to remain in contact with cancer genetics annually so that we can continuously update the family history and inform him of any changes in cancer genetics and testing that may be of benefit for this family.   Mr. Weigold questions were answered to his satisfaction today. Our contact information was provided should additional questions or concerns arise. Thank you for the referral and allowing [place] to share in the care of your patient.   Cari M. Joette Catching, [place], [facility] Genetic Counselor [email or web address] (P) [phone]  The patient was seen for a total of 30 minutes in face-to-face genetic counseling.  The patient brought his daughter, [name], to his appointment.   Drs. Magrinat, Lindi Adie and/or Burr Medico were available to discuss this case as needed.    _______________________________________________________________________ For Office Staff:  Number of people involved in session: 2 Was an Intern/ student involved with case: no

## 2020-09-28 NOTE — Patient Instructions (Signed)

## 2020-09-28 NOTE — Progress Notes (Signed)
Received request from Dr Chryl Heck to add the following premedication to the treatment day:  Zofran 8 mg IV x 1 and Pepcid 20 mg IVPB x 1  T.Jenetta Downer Dr Thea Alken, PharmD 09/28/20 @ 1500

## 2020-09-28 NOTE — Progress Notes (Signed)
Pt declined to stay for 30 min observation post iron infusion. Pt stable at discharge and ambulatory to lobby with walker. VS remained stable and pt called transportation himself. All questions answered at time of discharge and pt reviewed AVS.

## 2020-10-01 ENCOUNTER — Other Ambulatory Visit: Payer: Medicare PPO

## 2020-10-01 ENCOUNTER — Other Ambulatory Visit: Payer: Self-pay

## 2020-10-01 ENCOUNTER — Inpatient Hospital Stay: Payer: Medicare PPO | Attending: Hematology and Oncology

## 2020-10-01 ENCOUNTER — Ambulatory Visit: Payer: Medicare PPO

## 2020-10-01 ENCOUNTER — Ambulatory Visit: Payer: Medicare PPO | Admitting: Hematology and Oncology

## 2020-10-01 VITALS — BP 134/69 | HR 66 | Temp 98.4°F | Resp 18

## 2020-10-01 DIAGNOSIS — D509 Iron deficiency anemia, unspecified: Secondary | ICD-10-CM

## 2020-10-01 DIAGNOSIS — Z79899 Other long term (current) drug therapy: Secondary | ICD-10-CM | POA: Insufficient documentation

## 2020-10-01 DIAGNOSIS — C20 Malignant neoplasm of rectum: Secondary | ICD-10-CM | POA: Insufficient documentation

## 2020-10-01 DIAGNOSIS — Z5111 Encounter for antineoplastic chemotherapy: Secondary | ICD-10-CM | POA: Diagnosis present

## 2020-10-01 DIAGNOSIS — H3320 Serous retinal detachment, unspecified eye: Secondary | ICD-10-CM | POA: Diagnosis not present

## 2020-10-01 DIAGNOSIS — D500 Iron deficiency anemia secondary to blood loss (chronic): Secondary | ICD-10-CM | POA: Diagnosis not present

## 2020-10-01 DIAGNOSIS — Z7952 Long term (current) use of systemic steroids: Secondary | ICD-10-CM | POA: Insufficient documentation

## 2020-10-01 DIAGNOSIS — G8929 Other chronic pain: Secondary | ICD-10-CM | POA: Diagnosis not present

## 2020-10-01 DIAGNOSIS — Z5112 Encounter for antineoplastic immunotherapy: Secondary | ICD-10-CM | POA: Diagnosis present

## 2020-10-01 DIAGNOSIS — M549 Dorsalgia, unspecified: Secondary | ICD-10-CM | POA: Diagnosis not present

## 2020-10-01 DIAGNOSIS — D472 Monoclonal gammopathy: Secondary | ICD-10-CM | POA: Insufficient documentation

## 2020-10-01 MED ORDER — SODIUM CHLORIDE 0.9 % IV SOLN
Freq: Once | INTRAVENOUS | Status: AC
  Filled 2020-10-01: qty 250

## 2020-10-01 MED ORDER — SODIUM CHLORIDE 0.9 % IV SOLN
200.0000 mg | Freq: Once | INTRAVENOUS | Status: AC
  Administered 2020-10-01: 200 mg via INTRAVENOUS
  Filled 2020-10-01: qty 200

## 2020-10-01 NOTE — Progress Notes (Signed)
Pt tolerated iron infusion well and declined to stay for 30 min observation. VSS and pt discharged to lobby in stable condition. Pt called ride, declined AVS

## 2020-10-01 NOTE — Patient Instructions (Signed)

## 2020-10-02 NOTE — Progress Notes (Deleted)
[place] NOTE  Patient Care Team: [name], MD as PCP - General (Internal Medicine) Community, [facility], MD as Consulting Physician (Ophthalmology) Benay Pike, MD as Consulting Physician (Hematology and Oncology)  CHIEF COMPLAINTS/PURPOSE OF CONSULTATION:  Consult for anemia.  ASSESSMENT & PLAN:   Adenocarcinoma rectum, synchronous tumors, T3N1, MSI high in one mass, MSS in second mass  This is a very pleasant 87 year old male patient who was initially referred to [place] for evaluation of iron deficiency anemia now has a colonoscopy which showed a rectal mass with biopsy confirming invasive adenocarcinoma, currently staged as T3N1  , addended staging, MR liver with no evidence of metastatic disease who is here for FU. He had been discussed in the GI tumor board.  He appears to have 2 synchronous tumors in the mid rectum and the distal rectum with one of them showing microsatellite instability suggesting response to immunotherapy.  The second tumor has intact mismatch repair protein. He has met with all disciplines including radiation oncology and surgical oncology.  At this time the surgeons do not recommend surgery given his age and risk perioperatively.  Hence the plan was to consider neoadjuvant chemoimmunotherapy (this is not quite by the book however given his age and to avoid a doublet chemo and also since his large tumor has microsatellite instability, we will her we agreed to try 5-fluorouracil with immunotherapy for a couple cycles and reevaluate).  I have discussed this in very much detail today with the patient and his daughter.  I have discussed about adverse effects of chemotherapy including but not limited to fatigue, nausea, vomiting, diarrhea, increased risk of infections, coronary vasospasm, severe toxicity in patients with DPD deficiency.  We have also discussed about mechanism of action of immunotherapy and adverse  effects from immunotherapy including but not limited to fatigue, dry skin, diarrhea, hypo-/hyperthyroidism and about 1 to 2% of the people have severe fatal side effects.   They understand that immunotherapy can virtually attack any organ and can lead to fatal organ failure.  He is however willing to try as recommended and follow-up.  We have talked about a port placement, he is in agreement with that and this has been ordered.  Once chemo is authorized, we will proceed with 5-fluorouracil and Opdivo every 2 weeks for 2 cycles and repeat imaging.  Iron deficiency anemia secondary to the tumor resulting in worsening fatigue.  We will proceed with intravenous iron infusions, he tolerated this very well in the past and is willing to do this again.  Thank you for consulting [place] in the care of this patient.  Please not hesitate to contact us with any additional questions or concerns.   HISTORY OF PRESENTING ILLNESS:   Alec Weiss 87 y.o. male is here because of new diagnosis of rectal cancer.  [name] is a very pleasant 87 year old male patient with past medical history significant for retinal detachment and eye surgery, chronic low back pain referred to hematology for evaluation and recommendations regarding his anemia.    During his initial visit, we discussed about IV iron infusion, GI evaluation and further evaluation of anemia. Colonoscopy showed malignant partially obstructing tumor in the mid rectum as well as distal rectum. Biopsies confirmed invasive adenocarcinoma, intact nuclear expression. He is now here for follow-up after new diagnosis of adenocarcinoma of the rectum.  Since his last visit, I have briefly spoken to him on the phone discussing the pathology results and the role  for imaging.  He also had CT chest abdomen pelvis and MRI pelvis yesterday and is here to discuss the results. CT imaging showed circumferential thickening of the lower rectum, a segment approximately 6 cm in  length to the anal verge consistent with primary rectal malignancy, enlarged left pelvic sidewall lymph node measuring up to 1.3 x 1.1 cm not significantly changed compared to prior exam.  No other evidence of distant metastatic disease, ill-defined low-attenuation lesion of the right lobe of the liver is unchanged compared to prior exam and almost most likely a small hemangioma measuring approximately 1.8 x 1.1 cm. MRI pelvis confirmed staging to be T3N0. MR pelvis staging addended to T3N2 given concerning LN appearance on imaging review MR liver with no mets.  Pathology from rectal biopsy showed invasive adenocarcinoma in 2 separate masses.  Mass A did not show any evidence of mismatch repair however mass B showed evidence of MLH1 and PMS2 loss.  He is here for FU. Since last visit, he met Dr Lisbeth Renshaw and surgical oncology team He is very pleased that we are all working together to take care of him. He is hoping to get iron soon. No other concerning symptoms for me today  MEDICAL HISTORY:  Past Medical History:  Diagnosis Date   Actinic keratosis 04/05/2012   Insomnia, unspecified 06/08/2008   Lumbago 01/02/2003   Neoplasm of uncertain behavior of skin 04/05/2012   Pain in limb 02/05/2011   Retinal detachment 2000   left eye   Screening cholesterol level    Shortness of breath 09/07/2008   Vitamin D deficiency 08/04/2012    SURGICAL HISTORY: Past Surgical History:  Procedure Laterality Date   BIOPSY  08/23/2020   Procedure: BIOPSY;  Surgeon: Doran Stabler, MD;  Location: [facility];  Service: Gastroenterology;;   COLONOSCOPY WITH PROPOFOL N/A 08/23/2020   Procedure: COLONOSCOPY WITH PROPOFOL;  Surgeon: Doran Stabler, MD;  Location: [facility];  Service: Gastroenterology;  Laterality: N/A;   ESOPHAGOGASTRODUODENOSCOPY (EGD) WITH PROPOFOL N/A 08/23/2020   Procedure: ESOPHAGOGASTRODUODENOSCOPY (EGD) WITH PROPOFOL;  Surgeon: Doran Stabler, MD;  Location: [facility];  Service:  Gastroenterology;  Laterality: N/A;   EXTERNAL EAR SURGERY  05/2012   left ear    INGUINAL HERNIA REPAIR Right 01/21/2005   Dr. [name]   LID LESION EXCISION  2009   MOHS SURGERY Left 05/28/2012   ear lobe Dr. [name]   RETINAL DETACHMENT SURGERY Left 1997   [facility]   SUBMUCOSAL INJECTION  08/23/2020   Procedure: SUBMUCOSAL INJECTION;  Surgeon: Doran Stabler, MD;  Location: [facility];  Service: Gastroenterology;;    SOCIAL HISTORY: Social History   Socioeconomic History   Marital status: Widowed    Spouse name: Not on file   Number of children: Not on file   Years of education: Not on file   Highest education level: Not on file  Occupational History   Occupation: retired Hydrologist professor  Tobacco Use   Smoking status: Never   Smokeless tobacco: Never  Substance and Sexual Activity   Alcohol use: Yes    Alcohol/week: 1.0 standard drink    Types: 1 Glasses of wine per week    Comment: at dinner every night.   Drug use: No   Sexual activity: Never  Other Topics Concern   Not on file  Social History Narrative   Patient is  Married [name]) since 1958--widowed a few years ago (updating 2021). Retired professor, Hydrologist. Lives in apartment, Independent  Living section at [facility] since 02/2012.       No Smoking history, drinks moderate amount of alcohol    Patient has Advanced planning documents: DNR   Exercise walking - daily ~1 1/2 - 2 miles (~ 1hr daily) as well as [facility] Exercise classes.   Social Determinants of Health   Financial Resource Strain: Not on file  Food Insecurity: Not on file  Transportation Needs: Not on file  Physical Activity: Not on file  Stress: Not on file  Social Connections: Not on file  Intimate Partner Violence: Not on file    FAMILY HISTORY: Family History  Problem Relation Age of Onset   Breast cancer Mother        dx after 33    ALLERGIES:  is allergic to benzalkonium chloride, maxitrol  [neomycin-polymyxin-dexameth], and neosporin [neomycin-bacitracin zn-polymyx].  MEDICATIONS:  Current Outpatient Medications  Medication Sig Dispense Refill   clotrimazole (LOTRIMIN) 1 % cream Apply 1 application topically 2 (two) times daily. To left knee for two dry scaly areas until resolved (Patient taking differently: Apply 1 application topically 2 (two) times daily as needed (To left knee for two dry scaly areas until resolved).) 40 g 0   dexamethasone (DECADRON) 4 MG tablet Take 2 tablets (8 mg total) by mouth daily. Start the day after chemotherapy for 2 days. Take with food. 30 tablet 1   esomeprazole (NEXIUM) 40 MG capsule Take 1 capsule (40 mg total) by mouth daily. 30 capsule 3   iron polysaccharides (NU-IRON) 150 MG capsule Take 1 capsule (150 mg total) by mouth daily. 30 capsule 3   lidocaine-prilocaine (EMLA) cream Apply to affected area once 30 g 3   ondansetron (ZOFRAN) 8 MG tablet Take 1 tablet (8 mg total) by mouth 2 (two) times daily as needed for refractory nausea / vomiting. Start on day 3 after chemotherapy. 30 tablet 1   Polyethyl Glycol-Propyl Glycol (SYSTANE) 0.4-0.3 % SOLN Place 1 drop into both eyes daily as needed (dry eyes).     prochlorperazine (COMPAZINE) 10 MG tablet Take 1 tablet (10 mg total) by mouth every 6 (six) hours as needed (Nausea or vomiting). 30 tablet 1   vitamin B-12 (CYANOCOBALAMIN) 1000 MCG tablet Take 1 Tablet Daily 100 tablet 1   Vitamin D, Ergocalciferol, (DRISDOL) 1.25 MG (50000 UNIT) CAPS capsule TAKE ONE CAPSULE ONCE A WEEK FOR VITAMIN D SUPPLEMENT. (Patient taking differently: Take 50,000 Units by mouth every Tuesday. TAKE ONE CAPSULE ONCE A WEEK FOR VITAMIN D SUPPLEMENT.) 4 capsule 1   No current facility-administered medications for this visit.     PHYSICAL EXAMINATION: ECOG PERFORMANCE STATUS: 1 - Symptomatic but completely ambulatory  There were no vitals filed for this visit.  There were no vitals filed for this visit.  PE  deferred today in lieu of counseling.  LABORATORY DATA:  I have reviewed the data as listed Lab Results  Component Value Date   WBC 6.3 09/13/2020   HGB 7.7 (L) 09/13/2020   HCT 25.0 (L) 09/13/2020   MCV 78.6 (L) 09/13/2020   PLT 340 09/13/2020     Chemistry      Component Value Date/Time   NA 132 (L) 09/13/2020 1209   NA 132 (A) 07/05/2020 1528   K 4.7 09/13/2020 1209   CL 98 09/13/2020 1209   CO2 27 09/13/2020 1209   BUN 12 09/13/2020 1209   BUN 16 07/05/2020 1528   CREATININE 0.81 09/13/2020 1209   CREATININE 0.81 07/11/2020 1120  GLU 105 07/05/2020 1528      Component Value Date/Time   CALCIUM 9.4 09/13/2020 1209   ALKPHOS 85 09/13/2020 1209   AST 13 (L) 09/13/2020 1209   AST 17 07/11/2020 1120   ALT 9 09/13/2020 1209   ALT 8 07/11/2020 1120   BILITOT 0.4 09/13/2020 1209   BILITOT 0.4 07/11/2020 1120       RADIOGRAPHIC STUDIES: I have personally reviewed the radiological images as listed and agreed with the findings in the report. MR Pelvis Wo Contrast  Addendum Date: 09/20/2020   ADDENDUM REPORT: 09/20/2020 14:55 ADDENDUM: This case was discussed with Dr. Malena Peer. Using the 8th edition TNM staging classification for rectal cancer, with 2 positive regional lymph nodes, the N-stage is N1b. Electronically Signed   By: Marlaine Hind M.D.   On: 09/20/2020 14:55   Addendum Date: 09/17/2020   ADDENDUM REPORT: 09/17/2020 09:08 ADDENDUM: Additional review shows a 9 mm lymph node in the high perirectal space on image 11/3, and a 10 mm extra-mesorectal lymph node is seen in the left internal iliac chain on image 7/7. This changes the N stage by imaging from N0 to N2. Electronically Signed   By: Marlaine Hind M.D.   On: 09/17/2020 09:08   Result Date: 09/20/2020 CLINICAL DATA:  Newly diagnosed rectal adenocarcinoma. EXAM: MRI PELVIS WITHOUT CONTRAST TECHNIQUE: Multiplanar multisequence MR imaging of the pelvis was performed. No intravenous contrast was administered. Ultrasound  gel was administered per rectum to optimize tumor evaluation. COMPARISON:  None. FINDINGS: TUMOR LOCATION Tumor distance from Anal Verge/Skin Surface:  2.5 cm Tumor distance to Internal Anal Sphincter: 0 cm, tumor is seen involving the upper anal canal TUMOR DESCRIPTION Circumferential Extent: 100% Tumor Length: 6.7 cm T - CATEGORY Extension through Muscularis Propria: Yes, along the left anterolateral wall measuring up to 5 (image 31/7) = T3b Shortest Distance of any tumor/node from Mesorectal Fascia: 14 mm Extramural Vascular Invasion/Tumor Thrombus: No Invasion of Anterior Peritoneal Reflection: No Involvement of Adjacent Organs or Pelvic Sidewall: No Levator Ani Involvement: No N - CATEGORY Mesorectal Lymph Nodes >=9mm: None=N0 Extra-mesorectal Lymphadenopathy: No Other:  None. IMPRESSION: Rectal adenocarcinoma T stage: T3b Rectal adenocarcinoma N stage: N0 Distance from tumor to internal anal sphincter is 0 cm; tumor involves the upper anal sphincter. Electronically Signed: By: Marlaine Hind M.D. On: 09/06/2020 13:06   MR LIVER W WO CONTRAST  Result Date: 09/18/2020 CLINICAL DATA:  Liver lesion, history of rectal cancer. EXAM: MRI ABDOMEN WITHOUT AND WITH CONTRAST TECHNIQUE: Multiplanar multisequence MR imaging of the abdomen was performed both before and after the administration of intravenous contrast. CONTRAST:  36mL GADAVIST GADOBUTROL 1 MMOL/ML IV SOLN COMPARISON:  CT scan 09/05/2020 and 01/10/2020 FINDINGS: Lower chest: Unremarkable Hepatobiliary: 1.0 by 0.8 cm lesion in the dome of the liver on image 26 series 19 has high T2 and low T1 signal characteristics and peripheral centripetal nodular enhancement characteristic of hepatic hemangioma, along with delayed enhancement. Similarly, a 2.0 by 1.1 cm lesion in the right hepatic lobe on image 32 of series 19 corresponding to the lesion seen at CT has high T2 and low T1 signal characteristics with peripheral centripetal discontinuous enhancement and  delayed enhancement compatible with hepatic hemangioma. A 0.6 cm lesion in the dome of the right hepatic lobe on image 22 series 19 demonstrates delayed enhancement characteristic of hemangioma. On image 13 series 4, a small T2 hyperintense lesion anteriorly in the left hepatic lobe is observed. This is along the dome of the liver  and subject to susceptibility artifact. This lesion is difficult to appreciate on the other sequences but probably a tiny hemangioma. Multiple gallstones are present in the gallbladder including a dominant 4.6 cm in length stone as well as smaller stones. No significant biliary dilatation. Pancreas:  Unremarkable Spleen:  Unremarkable Adrenals/Urinary Tract: Small simple cyst of the right kidney upper pole, 0.9 cm in diameter. The adrenal glands appear unremarkable. Stomach/Bowel: The patient has a known rectal cancer which is not included on most of the sequences today. Sigmoid colon diverticulosis. Small type 1 hiatal hernia. Vascular/Lymphatic: Aortoiliac atherosclerotic vascular disease. No upper abdominal adenopathy. Other:  Prominence of stool in the colon. Musculoskeletal: Lumbar spondylosis and degenerative disc disease. IMPRESSION: 1. The lesion of concern in the right hepatic lobe is a benign hemangioma, as are 2 other nearby lesions in the liver. A small fourth lesion is present anteriorly in the left hepatic lobe and occult on most of the series, faintly T2 hyperintense on a single series, probably also a small hemangioma, but technically nonspecific due to small size and indistinctness. Surveillance suggested. 2. Cholelithiasis. 3. Small type 1 hiatal hernia. 4.  Aortic Atherosclerosis (ICD10-I70.0). 5. Prominence of stool in the colon. Electronically Signed   By: Van Clines M.D.   On: 09/18/2020 13:04   CT CHEST ABDOMEN PELVIS W CONTRAST  Result Date: 09/06/2020 CLINICAL DATA:  Rectal cancer staging EXAM: CT CHEST, ABDOMEN, AND PELVIS WITH CONTRAST TECHNIQUE:  Multidetector CT imaging of the chest, abdomen and pelvis was performed following the standard protocol during bolus administration of intravenous contrast. CONTRAST:  156mL OMNIPAQUE IOHEXOL 300 MG/ML SOLN, additional oral enteric contrast COMPARISON:  CT abdomen pelvis, 01/10/2020 FINDINGS: CT CHEST FINDINGS Cardiovascular: Aortic atherosclerosis. Normal heart size. Left and right coronary artery calcifications no pericardial effusion. Mediastinum/Nodes: No enlarged mediastinal, hilar, or axillary lymph nodes. Small hiatal hernia. Thyroid gland, trachea, and esophagus demonstrate no significant findings. Lungs/Pleura: Lungs are clear. No pleural effusion or pneumothorax. Musculoskeletal: No chest wall mass or suspicious bone lesions identified. CT ABDOMEN PELVIS FINDINGS Hepatobiliary: No solid liver abnormality is seen. Ill-defined low-attenuation lesion of the right lobe of the liver is unchanged compared to prior examination and incompletely characterized although most likely a small hemangioma, measuring approximately 1.8 x 1.1 cm (series 2, image 58). No gallstones, gallbladder wall thickening, or biliary dilatation. Pancreas: Unremarkable. No pancreatic ductal dilatation or surrounding inflammatory changes. Spleen: Normal in size without significant abnormality. Adrenals/Urinary Tract: Adrenal glands are unremarkable. Kidneys are normal, without renal calculi, solid lesion, or hydronephrosis. Bladder is unremarkable. Stomach/Bowel: Stomach is within normal limits. Appendix is not clearly visualized. Large burden of stool throughout the colon. Descending and sigmoid diverticulosis. Circumferential thickening of the low rectum, a segment approximately 6 cm in length to the anal verge Vascular/Lymphatic: Aortic atherosclerosis. Enlarged left pelvic sidewall lymph node measuring up to 1.3 x 1.1 cm, not significantly changed compared to prior examination (series 2, image 106). Reproductive: Prostatomegaly. Other:  No abdominal wall hernia or abnormality. No abdominopelvic ascites. Musculoskeletal: No acute or significant osseous findings. IMPRESSION: 1. Circumferential thickening of the low rectum, a segment approximately 6 cm in length to the anal verge, consistent with primary rectal malignancy. Please see forthcoming MR examination of the pelvis for staging. 2. Enlarged left pelvic sidewall lymph node measuring up to 1.3 x 1.1 cm, not significantly changed compared to prior examination. This is suspicious for nodal metastatic disease. 3. No evidence of distant metastatic disease in the chest, abdomen, or pelvis. 4. Ill-defined low-attenuation  lesion of the right lobe of the liver is unchanged compared to prior examination and incompletely characterized, although most likely a small hemangioma, measuring approximately 1.8 x 1.1 cm. This could be further characterized by contrast enhanced MR if desired. 5. Prostatomegaly. 6. Coronary artery disease. Aortic Atherosclerosis (ICD10-I70.0). Electronically Signed   By: Eddie Candle M.D.   On: 09/06/2020 12:18     All questions were answered. The patient knows to call the clinic with any problems, questions or concerns. I spent 40 minutes in the care of this patient including discussion with other disciplines,pharmacy, coordinating chemo/immunotherapy plan of care, discussing adverse effects, counseling and coordination of care.   Benay Pike, MD 10/02/2020 10:11 PM

## 2020-10-03 ENCOUNTER — Ambulatory Visit: Payer: Medicare PPO | Admitting: Hematology and Oncology

## 2020-10-03 ENCOUNTER — Other Ambulatory Visit: Payer: Self-pay

## 2020-10-03 ENCOUNTER — Inpatient Hospital Stay: Payer: Medicare PPO

## 2020-10-03 ENCOUNTER — Ambulatory Visit: Payer: Medicare PPO

## 2020-10-03 VITALS — BP 135/66 | HR 67 | Temp 97.8°F | Resp 16

## 2020-10-03 DIAGNOSIS — D509 Iron deficiency anemia, unspecified: Secondary | ICD-10-CM

## 2020-10-03 DIAGNOSIS — C20 Malignant neoplasm of rectum: Secondary | ICD-10-CM

## 2020-10-03 DIAGNOSIS — Z5112 Encounter for antineoplastic immunotherapy: Secondary | ICD-10-CM | POA: Diagnosis not present

## 2020-10-03 DIAGNOSIS — D649 Anemia, unspecified: Secondary | ICD-10-CM

## 2020-10-03 LAB — CMP (CANCER CENTER ONLY)
ALT: 12 U/L (ref 0–44)
AST: 15 U/L (ref 15–41)
Albumin: 3.3 g/dL — ABNORMAL LOW (ref 3.5–5.0)
Alkaline Phosphatase: 74 U/L (ref 38–126)
Anion gap: 7 (ref 5–15)
BUN: 13 mg/dL (ref 8–23)
CO2: 25 mmol/L (ref 22–32)
Calcium: 9.1 mg/dL (ref 8.9–10.3)
Chloride: 100 mmol/L (ref 98–111)
Creatinine: 0.86 mg/dL (ref 0.61–1.24)
GFR, Estimated: >60 mL/min (ref >60)
Glucose, Bld: 112 mg/dL — ABNORMAL HIGH (ref 70–99)
Potassium: 4.5 mmol/L (ref 3.5–5.1)
Sodium: 132 mmol/L — ABNORMAL LOW (ref 135–145)
Total Bilirubin: 0.3 mg/dL (ref 0.3–1.2)
Total Protein: 6.4 g/dL — ABNORMAL LOW (ref 6.5–8.1)

## 2020-10-03 LAB — CBC WITH DIFFERENTIAL (CANCER CENTER ONLY)
Abs Immature Granulocytes: 0.03 10*3/uL (ref 0.00–0.07)
Basophils Absolute: 0.1 10*3/uL (ref 0.0–0.1)
Basophils Relative: 1 %
Eosinophils Absolute: 0 10*3/uL (ref 0.0–0.5)
Eosinophils Relative: 0 %
HCT: 23.2 % — ABNORMAL LOW (ref 39.0–52.0)
Hemoglobin: 7.3 g/dL — ABNORMAL LOW (ref 13.0–17.0)
Immature Granulocytes: 0 %
Lymphocytes Relative: 10 %
Lymphs Abs: 0.8 10*3/uL (ref 0.7–4.0)
MCH: 24.7 pg — ABNORMAL LOW (ref 26.0–34.0)
MCHC: 31.5 g/dL (ref 30.0–36.0)
MCV: 78.4 fL — ABNORMAL LOW (ref 80.0–100.0)
Monocytes Absolute: 1 10*3/uL (ref 0.1–1.0)
Monocytes Relative: 13 %
Neutro Abs: 5.5 10*3/uL (ref 1.7–7.7)
Neutrophils Relative %: 76 %
Platelet Count: 354 10*3/uL (ref 150–400)
RBC: 2.96 MIL/uL — ABNORMAL LOW (ref 4.22–5.81)
RDW: 19.6 % — ABNORMAL HIGH (ref 11.5–15.5)
WBC Count: 7.3 10*3/uL (ref 4.0–10.5)
nRBC: 0 % (ref 0.0–0.2)

## 2020-10-03 LAB — PREPARE RBC (CROSSMATCH)

## 2020-10-03 LAB — SAMPLE TO BLOOD BANK

## 2020-10-03 LAB — ABO/RH: ABO/RH(D): A POS

## 2020-10-03 MED ORDER — SODIUM CHLORIDE 0.9 % IV SOLN
Freq: Once | INTRAVENOUS | Status: AC
  Filled 2020-10-03: qty 250

## 2020-10-03 MED ORDER — SODIUM CHLORIDE 0.9 % IV SOLN
200.0000 mg | Freq: Once | INTRAVENOUS | Status: AC
  Administered 2020-10-03: 200 mg via INTRAVENOUS
  Filled 2020-10-03: qty 200

## 2020-10-03 NOTE — Patient Instructions (Signed)

## 2020-10-04 ENCOUNTER — Other Ambulatory Visit: Payer: Medicare PPO

## 2020-10-04 ENCOUNTER — Ambulatory Visit: Payer: Medicare PPO | Admitting: Hematology and Oncology

## 2020-10-04 ENCOUNTER — Inpatient Hospital Stay: Payer: Medicare PPO

## 2020-10-04 ENCOUNTER — Ambulatory Visit: Payer: Medicare PPO

## 2020-10-04 DIAGNOSIS — D649 Anemia, unspecified: Secondary | ICD-10-CM

## 2020-10-04 DIAGNOSIS — Z5112 Encounter for antineoplastic immunotherapy: Secondary | ICD-10-CM | POA: Diagnosis not present

## 2020-10-04 LAB — CEA (IN HOUSE-CHCC): CEA (CHCC-In House): 6.23 ng/mL — ABNORMAL HIGH (ref 0.00–5.00)

## 2020-10-04 LAB — TSH: TSH: 1.759 u[IU]/mL (ref 0.320–4.118)

## 2020-10-04 LAB — T4: T4, Total: 6.7 ug/dL (ref 4.5–12.0)

## 2020-10-04 MED ORDER — SODIUM CHLORIDE 0.9% IV SOLUTION
250.0000 mL | Freq: Once | INTRAVENOUS | Status: AC
  Administered 2020-10-04: 250 mL via INTRAVENOUS
  Filled 2020-10-04: qty 250

## 2020-10-04 MED ORDER — ACETAMINOPHEN 325 MG PO TABS
ORAL_TABLET | ORAL | Status: AC
  Filled 2020-10-04: qty 2

## 2020-10-04 MED ORDER — FAMOTIDINE 20 MG PO TABS
20.0000 mg | ORAL_TABLET | Freq: Once | ORAL | Status: AC
Start: 2020-10-04 — End: 2020-10-04
  Administered 2020-10-04: 20 mg via ORAL

## 2020-10-04 MED ORDER — ACETAMINOPHEN 325 MG PO TABS
650.0000 mg | ORAL_TABLET | Freq: Once | ORAL | Status: AC
  Administered 2020-10-04: 650 mg via ORAL

## 2020-10-05 ENCOUNTER — Other Ambulatory Visit: Payer: Self-pay

## 2020-10-05 ENCOUNTER — Inpatient Hospital Stay: Payer: Medicare PPO

## 2020-10-05 ENCOUNTER — Encounter: Payer: Self-pay | Admitting: Nurse Practitioner

## 2020-10-05 VITALS — BP 128/72 | HR 62 | Temp 97.7°F | Resp 18 | Wt 150.2 lb

## 2020-10-05 DIAGNOSIS — D509 Iron deficiency anemia, unspecified: Secondary | ICD-10-CM

## 2020-10-05 DIAGNOSIS — Z5112 Encounter for antineoplastic immunotherapy: Secondary | ICD-10-CM | POA: Diagnosis not present

## 2020-10-05 LAB — TYPE AND SCREEN
ABO/RH(D): A POS
Antibody Screen: NEGATIVE
Unit division: 0
Unit division: 0

## 2020-10-05 LAB — BPAM RBC
Blood Product Expiration Date: 202208272359
Blood Product Expiration Date: 202208272359
ISSUE DATE / TIME: 202208040751
ISSUE DATE / TIME: 202208040751
Unit Type and Rh: 6200
Unit Type and Rh: 6200

## 2020-10-05 MED ORDER — SODIUM CHLORIDE 0.9 % IV SOLN
Freq: Once | INTRAVENOUS | Status: AC
  Filled 2020-10-05: qty 250

## 2020-10-05 MED ORDER — SODIUM CHLORIDE 0.9 % IV SOLN
200.0000 mg | Freq: Once | INTRAVENOUS | Status: AC
  Administered 2020-10-05: 200 mg via INTRAVENOUS
  Filled 2020-10-05: qty 200

## 2020-10-05 NOTE — Patient Instructions (Signed)

## 2020-10-05 NOTE — Progress Notes (Signed)
Pt declined to stay for 30-min post observation post venofer. Pt states understanding of risks. VSS. D/C in stable condition using walker.

## 2020-10-08 ENCOUNTER — Other Ambulatory Visit: Payer: Self-pay | Admitting: Internal Medicine

## 2020-10-09 ENCOUNTER — Ambulatory Visit (HOSPITAL_COMMUNITY)
Admission: RE | Admit: 2020-10-09 | Discharge: 2020-10-09 | Disposition: A | Discharge status: Home / Self Care | Payer: Medicare PPO | Source: Ambulatory Visit | Attending: Hematology and Oncology | Admitting: Hematology and Oncology

## 2020-10-09 ENCOUNTER — Encounter: Payer: Self-pay | Admitting: Hematology

## 2020-10-09 ENCOUNTER — Encounter: Payer: Self-pay | Admitting: Hematology and Oncology

## 2020-10-09 ENCOUNTER — Other Ambulatory Visit: Payer: Self-pay

## 2020-10-09 ENCOUNTER — Encounter: Payer: Self-pay | Admitting: Internal Medicine

## 2020-10-09 ENCOUNTER — Other Ambulatory Visit: Payer: Self-pay | Admitting: Internal Medicine

## 2020-10-09 ENCOUNTER — Encounter (HOSPITAL_COMMUNITY): Payer: Self-pay

## 2020-10-09 ENCOUNTER — Inpatient Hospital Stay: Payer: Medicare PPO | Admitting: Hematology

## 2020-10-09 VITALS — BP 132/71 | HR 72 | Temp 98.1°F | Resp 18 | Ht 71.0 in | Wt 147.2 lb

## 2020-10-09 DIAGNOSIS — Z5112 Encounter for antineoplastic immunotherapy: Secondary | ICD-10-CM | POA: Diagnosis not present

## 2020-10-09 DIAGNOSIS — C20 Malignant neoplasm of rectum: Secondary | ICD-10-CM | POA: Insufficient documentation

## 2020-10-09 DIAGNOSIS — Z888 Allergy status to other drugs, medicaments and biological substances status: Secondary | ICD-10-CM | POA: Diagnosis not present

## 2020-10-09 DIAGNOSIS — Z883 Allergy status to other anti-infective agents status: Secondary | ICD-10-CM | POA: Diagnosis not present

## 2020-10-09 HISTORY — PX: IR IMAGING GUIDED PORT INSERTION: IMG5740

## 2020-10-09 LAB — CBC
HCT: 34.2 % — ABNORMAL LOW (ref 39.0–52.0)
Hemoglobin: 10.6 g/dL — ABNORMAL LOW (ref 13.0–17.0)
MCH: 26 pg (ref 26.0–34.0)
MCHC: 31 g/dL (ref 30.0–36.0)
MCV: 84 fL (ref 80.0–100.0)
Platelets: 357 10*3/uL (ref 150–400)
RBC: 4.07 MIL/uL — ABNORMAL LOW (ref 4.22–5.81)
RDW: 23.1 % — ABNORMAL HIGH (ref 11.5–15.5)
WBC: 7.6 10*3/uL (ref 4.0–10.5)
nRBC: 0 % (ref 0.0–0.2)

## 2020-10-09 IMAGING — US IR IMAGING GUIDED PORT INSERTION
2 series · 2 of 2 positions shown · non-contrast
Comparison: none

CLINICAL DATA: RECTAL ADENOCARCINOMA

[Series 1: ir fluoro/shunt/fist · 1 of 1 slices shown]
[im 1/1]
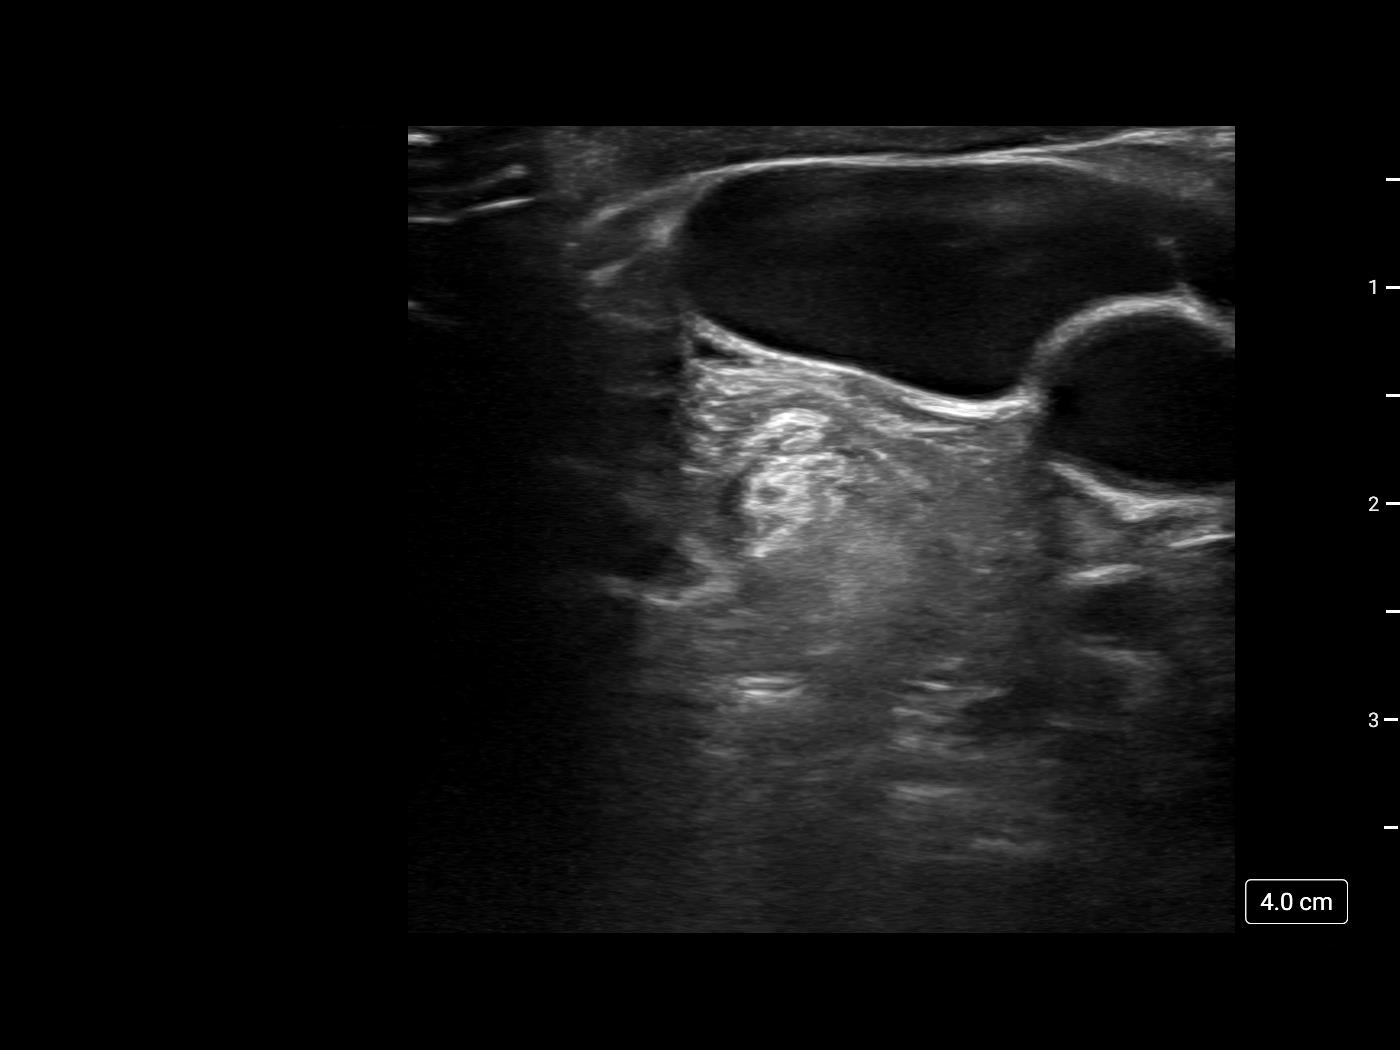

[Series 300: ir imaging guided port insertion · 1 of 1 slices shown]
[im 1/1]
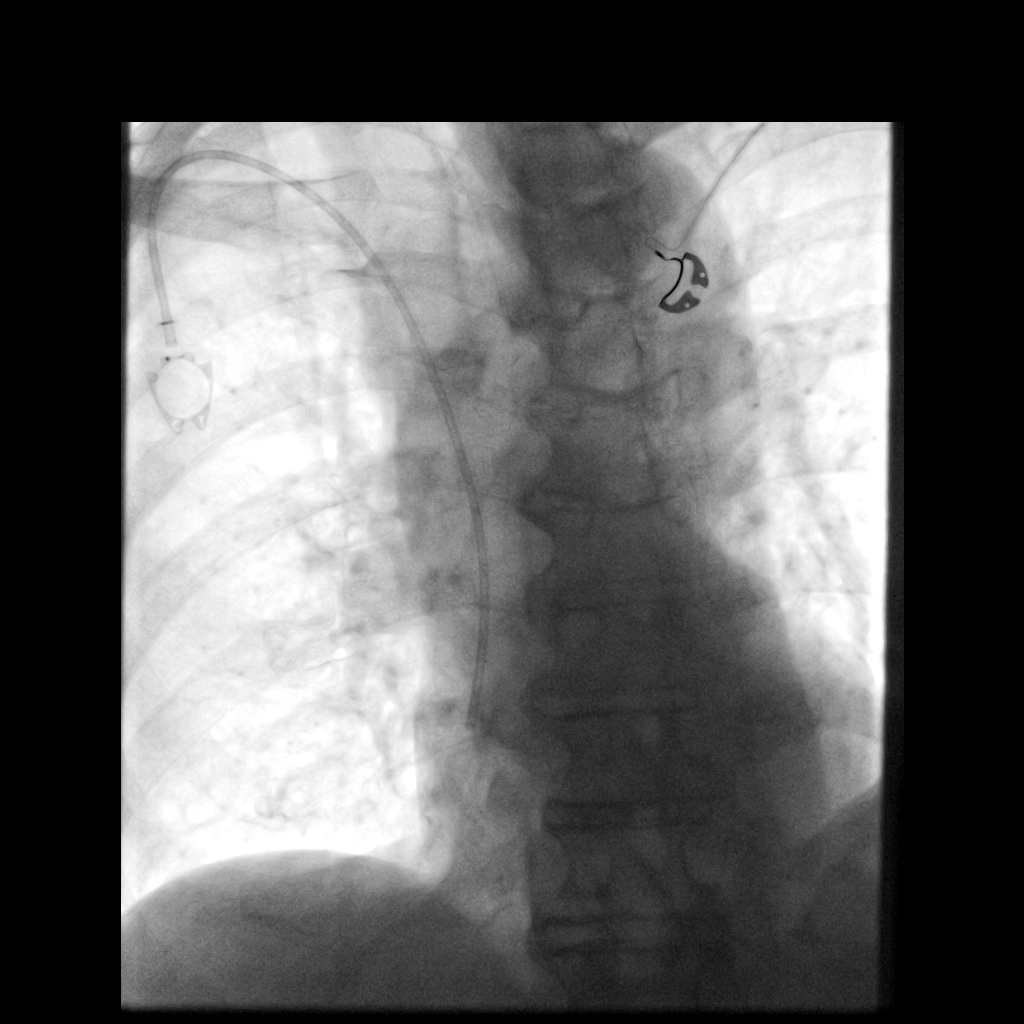

[2 of 2 positions shown; findings below may reference images not displayed]

EXAM:
RIGHT INTERNAL JUGULAR SINGLE LUMEN POWER PORT CATHETER INSERTION

Date:  [DATE] [DATE] [DATE]

Radiologist:  BASKARAN

Guidance:  Ultrasound and fluoroscopic

MEDICATIONS:
1% lidocaine local

ANESTHESIA/SEDATION:
Versed 1.5 mg IV; Fentanyl 100 mcg IV;

Moderate Sedation Time:  26 minutes

The patient was continuously monitored during the procedure by the
interventional radiology nurse under my direct supervision.

FLUOROSCOPY TIME:  0 minutes, 42 seconds (3 mGy)

COMPLICATIONS:
None immediate.

CONTRAST:  None.

PROCEDURE:
Informed consent was obtained from the patient following explanation
of the procedure, risks, benefits and alternatives. The patient
understands, agrees and consents for the procedure. All questions
were addressed. A time out was performed.

Maximal barrier sterile technique utilized including caps, mask,
sterile gowns, sterile gloves, large sterile drape, hand hygiene,
and 2% chlorhexidine scrub.

Under sterile conditions and local anesthesia, right internal
jugular micropuncture venous access was performed. Access was
performed with ultrasound. Images were obtained for documentation of
the patent right internal jugular vein. A guide wire was inserted
followed by a transitional dilator. This allowed insertion of a
guide wire and catheter into the IVC. Measurements were obtained
from the SVC / RA junction back to the right IJ venotomy site. In
the right infraclavicular chest, a subcutaneous pocket was created
over the second anterior rib. This was done under sterile conditions
and local anesthesia. 1% lidocaine with epinephrine was utilized for
this. A 2.5 cm incision was made in the skin. Blunt dissection was
performed to create a subcutaneous pocket over the right pectoralis
major muscle. The pocket was flushed with saline vigorously. There
was adequate hemostasis. The port catheter was assembled and checked
for leakage. The port catheter was secured in the pocket with two
retention sutures. The tubing was tunneled subcutaneously to the
right venotomy site and inserted into the SVC/RA junction through a
valved peel-away sheath. Position was confirmed with fluoroscopy.
Images were obtained for documentation. The patient tolerated the
procedure well. No immediate complications. Incisions were closed in
a two layer fashion with 4 - 0 Vicryl suture. Dermabond was applied
to the skin. The port catheter was accessed, blood was aspirated
followed by saline and heparin flushes. Needle was removed. A dry
sterile dressing was applied.
IMPRESSION: Ultrasound and fluoroscopically guided right internal jugular single
lumen power port catheter insertion. Tip in the SVC/RA junction.
Catheter ready for use.

## 2020-10-09 MED ORDER — FENTANYL CITRATE (PF) 100 MCG/2ML IJ SOLN
INTRAMUSCULAR | Status: AC | PRN
  Administered 2020-10-09 (×2): 50 ug via INTRAVENOUS

## 2020-10-09 MED ORDER — FENTANYL CITRATE (PF) 100 MCG/2ML IJ SOLN
INTRAMUSCULAR | Status: AC
  Filled 2020-10-09: qty 2

## 2020-10-09 MED ORDER — MIDAZOLAM HCL 2 MG/2ML IJ SOLN
INTRAMUSCULAR | Status: AC
  Filled 2020-10-09: qty 4

## 2020-10-09 MED ORDER — LIDOCAINE-EPINEPHRINE 1 %-1:100000 IJ SOLN
INTRAMUSCULAR | Status: AC | PRN
  Administered 2020-10-09: 20 mL

## 2020-10-09 MED ORDER — LIDOCAINE-EPINEPHRINE (PF) 2 %-1:200000 IJ SOLN
INTRAMUSCULAR | Status: AC
  Administered 2020-10-09: 5 mL via SUBCUTANEOUS
  Filled 2020-10-09: qty 20

## 2020-10-09 MED ORDER — HEPARIN SOD (PORK) LOCK FLUSH 100 UNIT/ML IV SOLN
INTRAVENOUS | Status: AC | PRN
  Administered 2020-10-09: 500 [IU] via INTRAVENOUS

## 2020-10-09 MED ORDER — SODIUM CHLORIDE 0.9 % IV SOLN: INTRAVENOUS | Status: DC

## 2020-10-09 MED ORDER — HEPARIN SOD (PORK) LOCK FLUSH 100 UNIT/ML IV SOLN
INTRAVENOUS | Status: AC
  Administered 2020-10-09: 5 [IU]
  Filled 2020-10-09: qty 5

## 2020-10-09 MED ORDER — MIDAZOLAM HCL 2 MG/2ML IJ SOLN
INTRAMUSCULAR | Status: AC | PRN
Start: 2020-10-09 — End: 2020-10-09
  Administered 2020-10-09: 0.5 mg via INTRAVENOUS
  Administered 2020-10-09: 1 mg via INTRAVENOUS

## 2020-10-09 NOTE — Progress Notes (Signed)
Pt had 4 diarrhea stools during short stay visit, discussed follow up with his daughter (also Increasing risk to fall after sedation).

## 2020-10-09 NOTE — Discharge Instructions (Signed)
Urgent needs - Interventional Radiology on call MD [phone]  Wound - May remove dressing and shower in 24 to 48 hours.  Keep site clean and dry.  Replace with bandaid as needed.  Do not submerge in tub or water until site healing well. If closed with glue, glue will flake off on its own.  If ordered by your provider, may start Emla cream in 2 weeks or after incision is healed.  After completion of treatment, your provider should have you set up for monthly port flushes.   

## 2020-10-09 NOTE — Consult Note (Signed)
Chief Complaint: Patient was seen in consultation today for port a cath placement  Referring Physician(s): Iruku,Praveena  Supervising Physician: Daryll Brod  Patient Status: [facility] - Out-pt  History of Present Illness: Alec Weiss is a 87 y.o. male with history of newly diagnosed rectal carcinoma who presents today for Port-A-Cath placement for chemo/immunotherapy.  Past Medical History:  Diagnosis Date   Actinic keratosis 04/05/2012   Insomnia, unspecified 06/08/2008   Lumbago 01/02/2003   Neoplasm of uncertain behavior of skin 04/05/2012   Pain in limb 02/05/2011   Retinal detachment 2000   left eye   Screening cholesterol level    Shortness of breath 09/07/2008   Vitamin D deficiency 08/04/2012    Past Surgical History:  Procedure Laterality Date   BIOPSY  08/23/2020   Procedure: BIOPSY;  Surgeon: Doran Stabler, MD;  Location: [facility];  Service: Gastroenterology;;   COLONOSCOPY WITH PROPOFOL N/A 08/23/2020   Procedure: COLONOSCOPY WITH PROPOFOL;  Surgeon: Doran Stabler, MD;  Location: [facility];  Service: Gastroenterology;  Laterality: N/A;   ESOPHAGOGASTRODUODENOSCOPY (EGD) WITH PROPOFOL N/A 08/23/2020   Procedure: ESOPHAGOGASTRODUODENOSCOPY (EGD) WITH PROPOFOL;  Surgeon: Doran Stabler, MD;  Location: [facility];  Service: Gastroenterology;  Laterality: N/A;   EXTERNAL EAR SURGERY  05/2012   left ear    INGUINAL HERNIA REPAIR Right 01/21/2005   Dr. [name]   LID LESION EXCISION  2009   MOHS SURGERY Left 05/28/2012   ear lobe Dr. [name]   RETINAL DETACHMENT SURGERY Left 1997   [facility]   SUBMUCOSAL INJECTION  08/23/2020   Procedure: SUBMUCOSAL INJECTION;  Surgeon: Doran Stabler, MD;  Location: [facility];  Service: Gastroenterology;;    Allergies: Benzalkonium chloride, Maxitrol [neomycin-polymyxin-dexameth], and Neosporin [neomycin-bacitracin zn-polymyx]  Medications: Prior to Admission medications   Medication Sig Start Date End  Date Taking? Authorizing Provider  clotrimazole (LOTRIMIN) 1 % cream Apply 1 application topically 2 (two) times daily. To left knee for two dry scaly areas until resolved Patient taking differently: Apply 1 application topically 2 (two) times daily as needed (To left knee for two dry scaly areas until resolved). 05/28/20   Royal Hawthorn, NP  dexamethasone (DECADRON) 4 MG tablet Take 2 tablets (8 mg total) by mouth daily. Start the day after chemotherapy for 2 days. Take with food. 09/20/20   Benay Pike, MD  esomeprazole (NEXIUM) 40 MG capsule Take 1 capsule (40 mg total) by mouth daily. 07/04/20   [name], MD  iron polysaccharides (NU-IRON) 150 MG capsule Take 1 capsule (150 mg total) by mouth daily. 05/28/20   Royal Hawthorn, NP  lidocaine-prilocaine (EMLA) cream Apply to affected area once 09/20/20   Benay Pike, MD  ondansetron (ZOFRAN) 8 MG tablet Take 1 tablet (8 mg total) by mouth 2 (two) times daily as needed for refractory nausea / vomiting. Start on day 3 after chemotherapy. 09/20/20   Benay Pike, MD  Polyethyl Glycol-Propyl Glycol (SYSTANE) 0.4-0.3 % SOLN Place 1 drop into both eyes daily as needed (dry eyes).    [provider]  prochlorperazine (COMPAZINE) 10 MG tablet Take 1 tablet (10 mg total) by mouth every 6 (six) hours as needed (Nausea or vomiting). 09/20/20   Benay Pike, MD  vitamin B-12 (CYANOCOBALAMIN) 1000 MCG tablet Take 1 Tablet Daily 09/06/20   [name], MD  Vitamin D, Ergocalciferol, (DRISDOL) 1.25 MG (50000 UNIT) CAPS capsule TAKE ONE CAPSULE ONCE A WEEK FOR VITAMIN D  SUPPLEMENT. Patient taking differently: Take 50,000 Units by mouth every Tuesday. TAKE ONE CAPSULE ONCE A WEEK FOR VITAMIN D SUPPLEMENT. 07/04/20   [name], MD     Family History  Problem Relation Age of Onset   Breast cancer Mother        dx after 47    Social History   Socioeconomic History   Marital status: Widowed    Spouse name: Not on file   Number of  children: Not on file   Years of education: Not on file   Highest education level: Not on file  Occupational History   Occupation: retired Hydrologist professor  Tobacco Use   Smoking status: Never   Smokeless tobacco: Never  Substance and Sexual Activity   Alcohol use: Yes    Alcohol/week: 1.0 standard drink    Types: 1 Glasses of wine per week    Comment: at dinner every night.   Drug use: No   Sexual activity: Never  Other Topics Concern   Not on file  Social History Narrative   Patient is  Married [name]) since 1958--widowed a few years ago (updating 2021). Retired professor, Hydrologist. Lives in apartment, Independent Living section at [place] since 02/2012.       No Smoking history, drinks moderate amount of alcohol    Patient has Advanced planning documents: DNR   Exercise walking - daily ~1 1/2 - 2 miles (~ 1hr daily) as well as [employer] Exercise classes.   Social Determinants of Health   Financial Resource Strain: Not on file  Food Insecurity: Not on file  Transportation Needs: Not on file  Physical Activity: Not on file  Stress: Not on file  Social Connections: Not on file      Review of Systems denies fever, HA, CP, dyspnea, cough, abd pain, back pain,N/V or visible bleeding  Vital Signs:pending    Physical Exam awake, alert.  Chest clear to auscultation bilaterally.  Heart with regular rate and rhythm, positive murmur.  Abdomen soft, positive bowel sounds, nontender.  Pretibial edema noted.  Imaging: MR LIVER W WO CONTRAST  Result Date: 09/18/2020 CLINICAL DATA:  Liver lesion, history of rectal cancer. EXAM: MRI ABDOMEN WITHOUT AND WITH CONTRAST TECHNIQUE: Multiplanar multisequence MR imaging of the abdomen was performed both before and after the administration of intravenous contrast. CONTRAST:  76m GADAVIST GADOBUTROL 1 MMOL/ML IV SOLN COMPARISON:  CT scan 09/05/2020 and 01/10/2020 FINDINGS: Lower chest: Unremarkable Hepatobiliary: 1.0 by 0.8  cm lesion in the dome of the liver on image 26 series 19 has high T2 and low T1 signal characteristics and peripheral centripetal nodular enhancement characteristic of hepatic hemangioma, along with delayed enhancement. Similarly, a 2.0 by 1.1 cm lesion in the right hepatic lobe on image 32 of series 19 corresponding to the lesion seen at CT has high T2 and low T1 signal characteristics with peripheral centripetal discontinuous enhancement and delayed enhancement compatible with hepatic hemangioma. A 0.6 cm lesion in the dome of the right hepatic lobe on image 22 series 19 demonstrates delayed enhancement characteristic of hemangioma. On image 13 series 4, a small T2 hyperintense lesion anteriorly in the left hepatic lobe is observed. This is along the dome of the liver and subject to susceptibility artifact. This lesion is difficult to appreciate on the other sequences but probably a tiny hemangioma. Multiple gallstones are present in the gallbladder including a dominant 4.6 cm in length stone as well as smaller stones. No significant biliary dilatation. Pancreas:  Unremarkable Spleen:  Unremarkable Adrenals/Urinary Tract: Small simple cyst of the right kidney upper pole, 0.9 cm in diameter. The adrenal glands appear unremarkable. Stomach/Bowel: The patient has a known rectal cancer which is not included on most of the sequences today. Sigmoid colon diverticulosis. Small type 1 hiatal hernia. Vascular/Lymphatic: Aortoiliac atherosclerotic vascular disease. No upper abdominal adenopathy. Other:  Prominence of stool in the colon. Musculoskeletal: Lumbar spondylosis and degenerative disc disease. IMPRESSION: 1. The lesion of concern in the right hepatic lobe is a benign hemangioma, as are 2 other nearby lesions in the liver. A small fourth lesion is present anteriorly in the left hepatic lobe and occult on most of the series, faintly T2 hyperintense on a single series, probably also a small hemangioma, but technically  nonspecific due to small size and indistinctness. Surveillance suggested. 2. Cholelithiasis. 3. Small type 1 hiatal hernia. 4.  Aortic Atherosclerosis (ICD10-I70.0). 5. Prominence of stool in the colon. Electronically Signed   By: Van Clines M.D.   On: 09/18/2020 13:04    Labs:  CBC: Recent Labs    07/11/20 1120 08/23/20 0935 09/13/20 1209 10/03/20 1207  WBC 5.7 6.6 6.3 7.3  HGB 8.1* 8.6* 7.7* 7.3*  HCT 25.7*  27.1* 27.9* 25.0* 23.2*  PLT 407* 353 340 354    COAGS: No results for input(s): INR, APTT in the last 8760 hours.  BMP: Recent Labs    06/24/20 1217 06/27/20 0000 07/05/20 1528 07/11/20 1120 09/05/20 1430 09/13/20 1209 10/03/20 1207  NA 129*   < > 132* 137  --  132* 132*  K 3.8   < > 4.6 4.2  --  4.7 4.5  CL 96*   < > 98* 101  --  98 100  CO2 27   < > 27* 28  --  27 25  GLUCOSE 133*  --   --  91  --  88 112*  BUN 14   < > 16 12  --  12 13  CALCIUM 8.8*   < > 9.0 9.5  --  9.4 9.1  CREATININE 0.60*   < > 0.9 0.81 0.70 0.81 0.86  GFRNONAA >60  --   --  >60  --  >60 >60   < > = values in this interval not displayed.    LIVER FUNCTION TESTS: Recent Labs    06/24/20 1217 06/27/20 0000 07/05/20 1528 07/11/20 1120 09/13/20 1209 10/03/20 1207  BILITOT 0.4  --   --  0.4 0.4 0.3  AST 12*   < > 14 17 13* 15  ALT 8   < > 9* '8 9 12  '$ ALKPHOS 66   < > 68 86 85 74  PROT 6.2*  --   --  7.0 6.9 6.4*  ALBUMIN 3.7   < > 3.7 3.5 3.4* 3.3*   < > = values in this interval not displayed.    TUMOR MARKERS: No results for input(s): AFPTM, CEA, CA199, CHROMGRNA in the last 8760 hours.  Assessment and Plan: 87 y.o. male with history of newly diagnosed rectal carcinoma who presents today for Port-A-Cath placement for chemo/immunotherapy.Risks and benefits of image guided port-a-catheter placement was discussed with the patient including, but not limited to bleeding, infection, pneumothorax, or fibrin sheath development and need for additional procedures.  All of  the patient's questions were answered, patient is agreeable to proceed. Consent signed and in chart.    Thank you for this interesting consult.  I greatly enjoyed meeting Alec Weiss  and look forward to participating in their care.  A copy of this report was sent to the requesting provider on this date.  Electronically Signed: D. Rowe Robert, PA-C 10/09/2020, 12:55 PM   I spent a total of  25 minutes   in face to face in clinical consultation, greater than 50% of which was counseling/coordinating care for port a cath placement

## 2020-10-09 NOTE — Procedures (Signed)
Interventional Radiology Procedure Note  Procedure: RT IJ POWER PORT    Complications: None  Estimated Blood Loss:  MIN  Findings: TIP SVCRA    M. TREVOR Yael Angerer, MD    

## 2020-10-09 NOTE — Progress Notes (Signed)
[name]   Telephone:(336) [phone] Fax:(336) [phone]   Clinic Follow up Note   Patient Care Team: Alec Dad, MD as PCP - General (Internal Medicine) [facility] Lea, MD as Consulting Physician (Ophthalmology) Alec Pike, MD as Consulting Physician (Hematology and Oncology) 10/09/2020  CHIEF COMPLAINT: f/u rectal cancer   SUMMARY OF ONCOLOGIC HISTORY: Oncology History  Iron deficiency anemia  Adenocarcinoma of rectum ([place])  09/06/2020 Initial Diagnosis   Adenocarcinoma of rectum ([place])    09/20/2020 Cancer Staging   Staging form: Colon and Rectum, AJCC 8th Edition - Clinical: Stage IIIB (cT3, cN1b, cM0) - Signed by Alec Pike, MD on 09/20/2020    09/26/2020 - 09/26/2020 Chemotherapy          10/10/2020 -  Chemotherapy    Patient is on Treatment Plan: RECTAL 5 FU+ NIVOLUMAB Q14D         CURRENT THERAPY: 5-fu pump infusion and nivolumab every 2 weeks, starting October 11, 2018  INTERVAL HISTORY: Mr. Alec Weiss returns for follow-up.  I am seeing him due to the absence of his primary oncologist Dr. Chryl Weiss.He is accompanied by his daughter to the clinic today.  He is scheduled to have a port placement later today, and for cycle chemotherapy tomorrow.  He has had a chemo class, although he does not seem to remember it.  He still has persistent frequent bowel movement, with mild hematochezia.  No other new complaints.  All other systems were reviewed with the patient and are negative.  MEDICAL HISTORY:  Past Medical History:  Diagnosis Date   Actinic keratosis 04/05/2012   Insomnia, unspecified 06/08/2008   Lumbago 01/02/2003   Neoplasm of uncertain behavior of skin 04/05/2012   Pain in limb 02/05/2011   Retinal detachment 2000   left eye   Screening cholesterol level    Shortness of breath 09/07/2008   Vitamin D deficiency 08/04/2012    SURGICAL HISTORY: Past Surgical History:  Procedure Laterality Date   BIOPSY   08/23/2020   Procedure: BIOPSY;  Surgeon: Doran Stabler, MD;  Location: [facility];  Service: Gastroenterology;;   COLONOSCOPY WITH PROPOFOL N/A 08/23/2020   Procedure: COLONOSCOPY WITH PROPOFOL;  Surgeon: Doran Stabler, MD;  Location: [facility];  Service: Gastroenterology;  Laterality: N/A;   ESOPHAGOGASTRODUODENOSCOPY (EGD) WITH PROPOFOL N/A 08/23/2020   Procedure: ESOPHAGOGASTRODUODENOSCOPY (EGD) WITH PROPOFOL;  Surgeon: Doran Stabler, MD;  Location: [facility];  Service: Gastroenterology;  Laterality: N/A;   EXTERNAL EAR SURGERY  05/2012   left ear    INGUINAL HERNIA REPAIR Right 01/21/2005   Dr. [name]   IR IMAGING GUIDED PORT INSERTION  10/09/2020   LID LESION EXCISION  2009   MOHS SURGERY Left 05/28/2012   ear lobe Dr. [name]   RETINAL DETACHMENT SURGERY Left 1997   Alec Weiss   SUBMUCOSAL INJECTION  08/23/2020   Procedure: SUBMUCOSAL INJECTION;  Surgeon: Doran Stabler, MD;  Location: [facility];  Service: Gastroenterology;;    I have reviewed the social history and family history with the patient and they are unchanged from previous note.  ALLERGIES:  is allergic to benzalkonium chloride, maxitrol [neomycin-polymyxin-dexameth], and neosporin [neomycin-bacitracin zn-polymyx].  MEDICATIONS:  Current Outpatient Medications  Medication Sig Dispense Refill   clotrimazole (LOTRIMIN) 1 % cream Apply 1 application topically 2 (two) times daily. To left knee for two dry scaly areas until resolved 40 g 0   dexamethasone (DECADRON) 4 MG tablet Take 2 tablets (8 mg  total) by mouth daily. Start the day after chemotherapy for 2 days. Take with food. 30 tablet 1   esomeprazole (NEXIUM) 40 MG capsule Take 1 capsule (40 mg total) by mouth daily. 30 capsule 3   iron polysaccharides (NU-IRON) 150 MG capsule Take 1 capsule (150 mg total) by mouth daily. 30 capsule 3   lidocaine-prilocaine (EMLA) cream Apply to affected area once 30 g 3   ondansetron (ZOFRAN) 8 MG tablet  Take 1 tablet (8 mg total) by mouth 2 (two) times daily as needed for refractory nausea / vomiting. Start on day 3 after chemotherapy. 30 tablet 1   Polyethyl Glycol-Propyl Glycol (SYSTANE) 0.4-0.3 % SOLN Place 1 drop into both eyes daily as needed (dry eyes).     prochlorperazine (COMPAZINE) 10 MG tablet Take 1 tablet (10 mg total) by mouth every 6 (six) hours as needed (Nausea or vomiting). 30 tablet 1   vitamin B-12 (CYANOCOBALAMIN) 1000 MCG tablet Take 1 Tablet Daily 100 tablet 1   Vitamin D, Ergocalciferol, (DRISDOL) 1.25 MG (50000 UNIT) CAPS capsule TAKE ONE CAPSULE ONCE A WEEK FOR VITAMIN D SUPPLEMENT. (Patient taking differently: Take 50,000 Units by mouth every Tuesday. TAKE ONE CAPSULE ONCE A WEEK FOR VITAMIN D SUPPLEMENT.) 4 capsule 1   No current facility-administered medications for this visit.   Facility-Administered Medications Ordered in Other Visits  Medication Dose Route Frequency Provider Last Rate Last Admin   0.9 %  sodium chloride infusion   Intravenous Continuous Shon Hough, NP 50 mL/hr at 10/09/20 1325 New Bag at 10/09/20 1325   fentaNYL (SUBLIMAZE) 100 MCG/2ML injection            midazolam (VERSED) 2 MG/2ML injection             PHYSICAL EXAMINATION: ECOG PERFORMANCE STATUS: 2 - Symptomatic, <50% confined to bed  Vitals:   10/09/20 1209  BP: 132/71  Pulse: 72  Resp: 18  Temp: 98.1 F (36.7 C)  SpO2: 98%   Filed Weights   10/09/20 1209  Weight: 147 lb 3.2 oz (66.8 kg)    GENERAL:alert, no distress and comfortable SKIN: skin color, texture, turgor are normal, no rashes or significant lesions EYES: normal, Conjunctiva are pink and non-injected, sclera clear OROPHARYNX:no exudate, no erythema and lips, buccal mucosa, and tongue normal  NECK: supple, thyroid normal size, non-tender, without nodularity LYMPH:  no palpable lymphadenopathy in the cervical, axillary or inguinal LUNGS: clear to auscultation and percussion with normal breathing effort HEART:  regular rate & rhythm and no murmurs and no lower extremity edema ABDOMEN:abdomen soft, non-tender and normal bowel sounds Musculoskeletal:no cyanosis of digits and no clubbing  NEURO: alert & oriented x 3 with fluent speech, no focal motor/sensory deficits  LABORATORY DATA:  I have reviewed the data as listed CBC Latest Ref Rng & Units 10/09/2020 10/03/2020 09/13/2020  WBC 4.0 - 10.5 K/uL 7.6 7.3 6.3  Hemoglobin 13.0 - 17.0 g/dL 10.6(L) 7.3(L) 7.7(L)  Hematocrit 39.0 - 52.0 % 34.2(L) 23.2(L) 25.0(L)  Platelets 150 - 400 K/uL 357 354 340     CMP Latest Ref Rng & Units 10/03/2020 09/13/2020 09/05/2020  Glucose 70 - 99 mg/dL 508(A) 88 -  BUN 8 - 23 mg/dL 13 12 -  Creatinine 6.17 - 1.24 mg/dL 8.20 9.38 8.63  Sodium 135 - 145 mmol/L 132(L) 132(L) -  Potassium 3.5 - 5.1 mmol/L 4.5 4.7 -  Chloride 98 - 111 mmol/L 100 98 -  CO2 22 - 32 mmol/L 25 27 -  Calcium 8.9 - 10.3 mg/dL 9.1 9.4 -  Total Protein 6.5 - 8.1 g/dL 6.4(L) 6.9 -  Total Bilirubin 0.3 - 1.2 mg/dL 0.3 0.4 -  Alkaline Phos 38 - 126 U/L 74 85 -  AST 15 - 41 U/L 15 13(L) -  ALT 0 - 44 U/L 12 9 -      RADIOGRAPHIC STUDIES: I have personally reviewed the radiological images as listed and agreed with the findings in the report. IR IMAGING GUIDED PORT INSERTION  Result Date: 10/09/2020 CLINICAL DATA:  RECTAL ADENOCARCINOMA EXAM: RIGHT INTERNAL JUGULAR SINGLE LUMEN POWER PORT CATHETER INSERTION Date:  10/09/2020 10/09/2020 3:17 pm Radiologist:  Jerilynn Mages. Daryll Brod, MD Guidance:  Ultrasound and fluoroscopic MEDICATIONS: 1% lidocaine local ANESTHESIA/SEDATION: Versed 1.5 mg IV; Fentanyl 100 mcg IV; Moderate Sedation Time:  26 minutes The patient was continuously monitored during the procedure by the interventional radiology nurse under my direct supervision. FLUOROSCOPY TIME:  0 minutes, 42 seconds (3 mGy) COMPLICATIONS: None immediate. CONTRAST:  None. PROCEDURE: Informed consent was obtained from the patient following explanation of the procedure,  risks, benefits and alternatives. The patient understands, agrees and consents for the procedure. All questions were addressed. A time out was performed. Maximal barrier sterile technique utilized including caps, mask, sterile gowns, sterile gloves, large sterile drape, hand hygiene, and 2% chlorhexidine scrub. Under sterile conditions and local anesthesia, right internal jugular micropuncture venous access was performed. Access was performed with ultrasound. Images were obtained for documentation of the patent right internal jugular vein. A guide wire was inserted followed by a transitional dilator. This allowed insertion of a guide wire and catheter into the IVC. Measurements were obtained from the SVC / RA junction back to the right IJ venotomy site. In the right infraclavicular chest, a subcutaneous pocket was created over the second anterior rib. This was done under sterile conditions and local anesthesia. 1% lidocaine with epinephrine was utilized for this. A 2.5 cm incision was made in the skin. Blunt dissection was performed to create a subcutaneous pocket over the right pectoralis major muscle. The pocket was flushed with saline vigorously. There was adequate hemostasis. The port catheter was assembled and checked for leakage. The port catheter was secured in the pocket with two retention sutures. The tubing was tunneled subcutaneously to the right venotomy site and inserted into the SVC/RA junction through a valved peel-away sheath. Position was confirmed with fluoroscopy. Images were obtained for documentation. The patient tolerated the procedure well. No immediate complications. Incisions were closed in a two layer fashion with 4 - 0 Vicryl suture. Dermabond was applied to the skin. The port catheter was accessed, blood was aspirated followed by saline and heparin flushes. Needle was removed. A dry sterile dressing was applied. IMPRESSION: Ultrasound and fluoroscopically guided right internal jugular  single lumen power port catheter insertion. Tip in the SVC/RA junction. Catheter ready for use. Electronically Signed   By: Jerilynn Mages.  Shick M.D.   On: 10/09/2020 15:32     ASSESSMENT & PLAN:  87 yo male   Adenocarcinoma rectum, synchronous tumors, T3N1, MSI high in one mass, MSS in second mass Moderate anemia secondary to iron deficiency and GI bleeding, status post IV Venofer and blood transfusion Retinal detachment Chronic back pain  Plan -Chart reviewed, plan to start chemotherapy 5-FU pump infusion with leucovorin, and immunotherapy nivolumab every 2 weeks tomorrow -Patient has had chemo class, I reviewed potential side effects from chemo and immunotherapy with patient and his daughter again, and the management.  I  reviewed the antiemetics, and the usage of dexamethasone after chemo -He is scheduled to have lab before chemo tomorrow, I will review the results, chemo orders are signed. -We also discussed infusion pump.  Patient is not happy to have a pump.  I discussed the option of Xarelto oral chemo to replace the pump, but encouraged him to try the pump and he may not tolerate well.  His daughter also encouraged him, and will help him at home. -He is scheduled to return in 2 weeks and see Dr. Chryl Weiss before second cycle treatment -All questions were answered.   No orders of the defined types were placed in this encounter.  All questions were answered. The patient knows to call the clinic with any problems, questions or concerns. No barriers to learning was detected. I spent 20 minutes counseling the patient face to face. The total time spent in the appointment was 30 minutes and more than 50% was on counseling and review of test results     Truitt Merle, MD 10/09/20

## 2020-10-09 NOTE — Progress Notes (Signed)
Met w/ pt to introduce myself as his Arboriculturist.  Unfortunately there aren't any foundations offering copay assistance for his Dx and the type of ins he has.  I informed him of the [employer], went over what it covers and gave him an expense sheet.  Pt declined the grant at this time.  I gave him my card in case he changes his mind and for any questions or concerns he may have in the future.

## 2020-10-10 ENCOUNTER — Inpatient Hospital Stay: Payer: Medicare PPO

## 2020-10-10 VITALS — BP 157/75 | HR 70 | Temp 97.5°F | Resp 18 | Wt 145.5 lb

## 2020-10-10 DIAGNOSIS — Z95828 Presence of other vascular implants and grafts: Secondary | ICD-10-CM

## 2020-10-10 DIAGNOSIS — Z5112 Encounter for antineoplastic immunotherapy: Secondary | ICD-10-CM | POA: Diagnosis not present

## 2020-10-10 DIAGNOSIS — C20 Malignant neoplasm of rectum: Secondary | ICD-10-CM

## 2020-10-10 LAB — CBC WITH DIFFERENTIAL (CANCER CENTER ONLY)
Abs Immature Granulocytes: 0.02 10*3/uL (ref 0.00–0.07)
Basophils Absolute: 0 10*3/uL (ref 0.0–0.1)
Basophils Relative: 0 %
Eosinophils Absolute: 0 10*3/uL (ref 0.0–0.5)
Eosinophils Relative: 0 %
HCT: 31.2 % — ABNORMAL LOW (ref 39.0–52.0)
Hemoglobin: 9.9 g/dL — ABNORMAL LOW (ref 13.0–17.0)
Immature Granulocytes: 0 %
Lymphocytes Relative: 9 %
Lymphs Abs: 0.6 10*3/uL — ABNORMAL LOW (ref 0.7–4.0)
MCH: 26.3 pg (ref 26.0–34.0)
MCHC: 31.7 g/dL (ref 30.0–36.0)
MCV: 83 fL (ref 80.0–100.0)
Monocytes Absolute: 1.1 10*3/uL — ABNORMAL HIGH (ref 0.1–1.0)
Monocytes Relative: 16 %
Neutro Abs: 5 10*3/uL (ref 1.7–7.7)
Neutrophils Relative %: 75 %
Platelet Count: 302 10*3/uL (ref 150–400)
RBC: 3.76 MIL/uL — ABNORMAL LOW (ref 4.22–5.81)
RDW: 23.4 % — ABNORMAL HIGH (ref 11.5–15.5)
WBC Count: 6.8 10*3/uL (ref 4.0–10.5)
nRBC: 0 % (ref 0.0–0.2)

## 2020-10-10 LAB — TSH: TSH: 1.447 u[IU]/mL (ref 0.320–4.118)

## 2020-10-10 LAB — CMP (CANCER CENTER ONLY)
ALT: 12 U/L (ref 0–44)
AST: 16 U/L (ref 15–41)
Albumin: 3.3 g/dL — ABNORMAL LOW (ref 3.5–5.0)
Alkaline Phosphatase: 79 U/L (ref 38–126)
Anion gap: 8 (ref 5–15)
BUN: 15 mg/dL (ref 8–23)
CO2: 25 mmol/L (ref 22–32)
Calcium: 9.3 mg/dL (ref 8.9–10.3)
Chloride: 100 mmol/L (ref 98–111)
Creatinine: 0.76 mg/dL (ref 0.61–1.24)
GFR, Estimated: >60 mL/min (ref >60)
Glucose, Bld: 110 mg/dL — ABNORMAL HIGH (ref 70–99)
Potassium: 4.4 mmol/L (ref 3.5–5.1)
Sodium: 133 mmol/L — ABNORMAL LOW (ref 135–145)
Total Bilirubin: 0.6 mg/dL (ref 0.3–1.2)
Total Protein: 6.5 g/dL (ref 6.5–8.1)

## 2020-10-10 MED ORDER — SODIUM CHLORIDE 0.9 % IV SOLN
240.0000 mg | Freq: Once | INTRAVENOUS | Status: AC
  Administered 2020-10-10: 240 mg via INTRAVENOUS
  Filled 2020-10-10: qty 24

## 2020-10-10 MED ORDER — HEPARIN SOD (PORK) LOCK FLUSH 100 UNIT/ML IV SOLN
500.0000 [IU] | Freq: Once | INTRAVENOUS | Status: DC | PRN
  Filled 2020-10-10: qty 5

## 2020-10-10 MED ORDER — FAMOTIDINE 20 MG IN NS 100 ML IVPB
20.0000 mg | Freq: Once | INTRAVENOUS | Status: AC
  Administered 2020-10-10: 20 mg via INTRAVENOUS

## 2020-10-10 MED ORDER — SODIUM CHLORIDE 0.9% FLUSH
10.0000 mL | INTRAVENOUS | Status: DC | PRN
  Administered 2020-10-10: 10 mL via INTRAVENOUS
  Filled 2020-10-10: qty 10

## 2020-10-10 MED ORDER — ONDANSETRON HCL 4 MG/2ML IJ SOLN
INTRAMUSCULAR | Status: AC
  Filled 2020-10-10: qty 4

## 2020-10-10 MED ORDER — FAMOTIDINE 20 MG IN NS 100 ML IVPB
INTRAVENOUS | Status: AC
  Filled 2020-10-10: qty 100

## 2020-10-10 MED ORDER — FLUOROURACIL CHEMO INJECTION 2.5 GM/50ML
400.0000 mg/m2 | Freq: Once | INTRAVENOUS | Status: AC
  Administered 2020-10-10: 750 mg via INTRAVENOUS
  Filled 2020-10-10: qty 15

## 2020-10-10 MED ORDER — SODIUM CHLORIDE 0.9 % IV SOLN
INTRAVENOUS | Status: DC
  Filled 2020-10-10: qty 250

## 2020-10-10 MED ORDER — LEUCOVORIN CALCIUM INJECTION 350 MG
400.0000 mg/m2 | Freq: Once | INTRAVENOUS | Status: AC
  Administered 2020-10-10: 736 mg via INTRAVENOUS
  Filled 2020-10-10: qty 36.8

## 2020-10-10 MED ORDER — SODIUM CHLORIDE 0.9% FLUSH
10.0000 mL | INTRAVENOUS | Status: DC | PRN
  Filled 2020-10-10: qty 10

## 2020-10-10 MED ORDER — SODIUM CHLORIDE 0.9 % IV SOLN
2400.0000 mg/m2 | INTRAVENOUS | Status: DC
  Administered 2020-10-10: 4400 mg via INTRAVENOUS
  Filled 2020-10-10: qty 88

## 2020-10-10 MED ORDER — ONDANSETRON HCL 4 MG/2ML IJ SOLN
8.0000 mg | Freq: Once | INTRAMUSCULAR | Status: AC
  Administered 2020-10-10: 8 mg via INTRAVENOUS

## 2020-10-10 NOTE — Patient Instructions (Addendum)
[facility]  Discharge Instructions: Thank you for choosing [facility] to provide your oncology and hematology care.   If you have a lab appointment with the [facility], please go directly to the [facility] and check in at the registration area.   Wear comfortable clothing and clothing appropriate for easy access to any Portacath or PICC line.   We strive to give you quality time with your provider. You may need to reschedule your appointment if you arrive late (15 or more minutes).  Arriving late affects you and other patients whose appointments are after yours.  Also, if you miss three or more appointments without notifying the office, you may be dismissed from the clinic at the provider's discretion.      For prescription refill requests, have your pharmacy contact our office and allow 72 hours for refills to be completed.    Today you received the following chemotherapy and/or immunotherapy agents Nivolumab; Leucovorin; fluorourocil    To help prevent nausea and vomiting after your treatment, we encourage you to take your nausea medication as directed.  BELOW ARE SYMPTOMS THAT SHOULD BE REPORTED IMMEDIATELY: *FEVER GREATER THAN 100.4 F (38 C) OR HIGHER *CHILLS OR SWEATING *NAUSEA AND VOMITING THAT IS NOT CONTROLLED WITH YOUR NAUSEA MEDICATION *UNUSUAL SHORTNESS OF BREATH *UNUSUAL BRUISING OR BLEEDING *URINARY PROBLEMS (pain or burning when urinating, or frequent urination) *BOWEL PROBLEMS (unusual diarrhea, constipation, pain near the anus) TENDERNESS IN MOUTH AND THROAT WITH OR WITHOUT PRESENCE OF ULCERS (sore throat, sores in mouth, or a toothache) UNUSUAL RASH, SWELLING OR PAIN  UNUSUAL VAGINAL DISCHARGE OR ITCHING   Items with * indicate a potential emergency and should be followed up as soon as possible or go to the Emergency Department if any problems should occur.  Please show the CHEMOTHERAPY ALERT CARD or IMMUNOTHERAPY  ALERT CARD at check-in to the Emergency Department and triage nurse.  Should you have questions after your visit or need to cancel or reschedule your appointment, please contact [facility]  Dept: [phone]  and follow the prompts.  Office hours are 8:00 a.m. to 4:30 p.m. Monday - Friday. Please note that voicemails left after 4:00 p.m. may not be returned until the following business day.  We are closed weekends and major holidays. You have access to a nurse at all times for urgent questions. Please call the main number to the clinic Dept: [phone] and follow the prompts.   For any non-urgent questions, you may also contact your provider using MyChart. We now offer e-Visits for anyone 5 and older to request care online for non-urgent symptoms. For details visit [email or web address].   Also download the MyChart app! Go to the app store, search "MyChart", open the app, select [facility], and log in with your MyChart username and password.  Due to Covid, a mask is required upon entering the hospital/clinic. If you do not have a mask, one will be given to you upon arrival. For doctor visits, patients may have 1 support person aged 19 or older with them. For treatment visits, patients cannot have anyone with them due to current Covid guidelines and our immunocompromised population.   Nivolumab injection What is this medication? NIVOLUMAB (nye VOL ue mab) is a monoclonal antibody. It treats certain types of cancer. Some of the cancers treated are colon cancer, head and neck cancer,Hodgkin lymphoma, lung cancer, and melanoma. This medicine may be used for other purposes; ask your health  care provider orpharmacist if you have questions. COMMON BRAND NAME(S): Opdivo What should I tell my care team before I take this medication? They need to know if you have any of these conditions: autoimmune diseases like Crohn's disease, ulcerative colitis, or lupus have had or  planning to have an allogeneic stem cell transplant (uses someone else's stem cells) history of chest radiation history of organ transplant nervous system problems like myasthenia gravis or Guillain-Barre syndrome an unusual or allergic reaction to nivolumab, other medicines, foods, dyes, or preservatives pregnant or trying to get pregnant breast-feeding How should I use this medication? This medicine is for infusion into a vein. It is given by a health careprofessional in a hospital or clinic setting. A special MedGuide will be given to you before each treatment. Be sure to readthis information carefully each time. Talk to your pediatrician regarding the use of this medicine in children. While this drug may be prescribed for children as young as 12 years for selectedconditions, precautions do apply. Overdosage: If you think you have taken too much of this medicine contact apoison control center or emergency room at once. NOTE: This medicine is only for you. Do not share this medicine with others. What if I miss a dose? It is important not to miss your dose. Call your doctor or health careprofessional if you are unable to keep an appointment. What may interact with this medication? Interactions have not been studied. This list may not describe all possible interactions. Give your health care provider a list of all the medicines, herbs, non-prescription drugs, or dietary supplements you use. Also tell them if you smoke, drink alcohol, or use illegaldrugs. Some items may interact with your medicine. What should I watch for while using this medication? This drug may make you feel generally unwell. Continue your course of treatmenteven though you feel ill unless your doctor tells you to stop. You may need blood work done while you are taking this medicine. Do not become pregnant while taking this medicine or for 5 months after stopping it. Women should inform their doctor if they wish to become  pregnant or think they might be pregnant. There is a potential for serious side effects to an unborn child. Talk to your health care professional or pharmacist for more information. Do not breast-feed an infant while taking this medicine orfor 5 months after stopping it. What side effects may I notice from receiving this medication? Side effects that you should report to your doctor or health care professionalas soon as possible: allergic reactions like skin rash, itching or hives, swelling of the face, lips, or tongue breathing problems blood in the urine bloody or watery diarrhea or black, tarry stools changes in emotions or moods changes in vision chest pain cough dizziness feeling faint or lightheaded, falls fever, chills headache with fever, neck stiffness, confusion, loss of memory, sensitivity to light, hallucination, loss of contact with reality, or seizures joint pain mouth sores redness, blistering, peeling or loosening of the skin, including inside the mouth severe muscle pain or weakness signs and symptoms of high blood sugar such as dizziness; dry mouth; dry skin; fruity breath; nausea; stomach pain; increased hunger or thirst; increased urination signs and symptoms of kidney injury like trouble passing urine or change in the amount of urine signs and symptoms of liver injury like dark yellow or brown urine; general ill feeling or flu-like symptoms; light-colored stools; loss of appetite; nausea; right upper belly pain; unusually weak or tired; yellowing of the  eyes or skin swelling of the ankles, feet, hands trouble passing urine or change in the amount of urine unusually weak or tired weight gain or loss Side effects that usually do not require medical attention (report to yourdoctor or health care professional if they continue or are bothersome): bone pain constipation decreased appetite diarrhea muscle pain nausea, vomiting tiredness This list may not describe all  possible side effects. Call your doctor for medical advice about side effects. You may report side effects to FDA at1-800-FDA-1088. Where should I keep my medication? This drug is given in a hospital or clinic and will not be stored at home. NOTE: This sheet is a summary. It may not cover all possible information. If you have questions about this medicine, talk to your doctor, pharmacist, orhealth care provider.  2022 Elsevier/Gold Standard (2019-06-22 10:08:25)  Leucovorin injection What is this medication? LEUCOVORIN (loo koe VOR in) is used to prevent or treat the harmful effects of some medicines. This medicine is used to treat anemia caused by a low amount of folic acid in the body. It is also used with 5-fluorouracil (5-FU) to treatcolon cancer. This medicine may be used for other purposes; ask your health care provider orpharmacist if you have questions. What should I tell my care team before I take this medication? They need to know if you have any of these conditions: anemia from low levels of vitamin B-12 in the blood an unusual or allergic reaction to leucovorin, folic acid, other medicines, foods, dyes, or preservatives pregnant or trying to get pregnant breast-feeding How should I use this medication? This medicine is for injection into a muscle or into a vein. It is given by ahealth care professional in a hospital or clinic setting. Talk to your pediatrician regarding the use of this medicine in children.Special care may be needed. Overdosage: If you think you have taken too much of this medicine contact apoison control center or emergency room at once. NOTE: This medicine is only for you. Do not share this medicine with others. What if I miss a dose? This does not apply. What may interact with this medication? capecitabine fluorouracil phenobarbital phenytoin primidone trimethoprim-sulfamethoxazole This list may not describe all possible interactions. Give your health  care provider a list of all the medicines, herbs, non-prescription drugs, or dietary supplements you use. Also tell them if you smoke, drink alcohol, or use illegaldrugs. Some items may interact with your medicine. What should I watch for while using this medication? Your condition will be monitored carefully while you are receiving thismedicine. This medicine may increase the side effects of 5-fluorouracil, 5-FU. Tell your doctor or health care professional if you have diarrhea or mouth sores that donot get better or that get worse. What side effects may I notice from receiving this medication? Side effects that you should report to your doctor or health care professionalas soon as possible: allergic reactions like skin rash, itching or hives, swelling of the face, lips, or tongue breathing problems fever, infection mouth sores unusual bleeding or bruising unusually weak or tired Side effects that usually do not require medical attention (report to yourdoctor or health care professional if they continue or are bothersome): constipation or diarrhea loss of appetite nausea, vomiting This list may not describe all possible side effects. Call your doctor for medical advice about side effects. You may report side effects to FDA at1-800-FDA-1088. Where should I keep my medication? This drug is given in a hospital or clinic and will not be  stored at home. NOTE: This sheet is a summary. It may not cover all possible information. If you have questions about this medicine, talk to your doctor, pharmacist, orhealth care provider.  2022 Elsevier/Gold Standard (2007-08-24 16:50:29)  Fluorouracil, 5-FU injection What is this medication? FLUOROURACIL, 5-FU (flure oh YOOR a sil) is a chemotherapy drug. It slows the growth of cancer cells. This medicine is used to treat many types of cancer like breast cancer, colon or rectal cancer, pancreatic cancer, and stomachcancer. This medicine may be used for other  purposes; ask your health care provider orpharmacist if you have questions. COMMON BRAND NAME(S): Adrucil What should I tell my care team before I take this medication? They need to know if you have any of these conditions: blood disorders dihydropyrimidine dehydrogenase (DPD) deficiency infection (especially a virus infection such as chickenpox, cold sores, or herpes) kidney disease liver disease malnourished, poor nutrition recent or ongoing radiation therapy an unusual or allergic reaction to fluorouracil, other chemotherapy, other medicines, foods, dyes, or preservatives pregnant or trying to get pregnant breast-feeding How should I use this medication? This drug is given as an infusion or injection into a vein. It is administeredin a hospital or clinic by a specially trained health care professional. Talk to your pediatrician regarding the use of this medicine in children.Special care may be needed. Overdosage: If you think you have taken too much of this medicine contact apoison control center or emergency room at once. NOTE: This medicine is only for you. Do not share this medicine with others. What if I miss a dose? It is important not to miss your dose. Call your doctor or health careprofessional if you are unable to keep an appointment. What may interact with this medication? Do not take this medicine with any of the following medications: live virus vaccines This medicine may also interact with the following medications: medicines that treat or prevent blood clots like warfarin, enoxaparin, and dalteparin This list may not describe all possible interactions. Give your health care provider a list of all the medicines, herbs, non-prescription drugs, or dietary supplements you use. Also tell them if you smoke, drink alcohol, or use illegaldrugs. Some items may interact with your medicine. What should I watch for while using this medication? Visit your doctor for checks on your  progress. This drug may make you feel generally unwell. This is not uncommon, as chemotherapy can affect healthy cells as well as cancer cells. Report any side effects. Continue your course oftreatment even though you feel ill unless your doctor tells you to stop. In some cases, you may be given additional medicines to help with side effects.Follow all directions for their use. Call your doctor or health care professional for advice if you get a fever, chills or sore throat, or other symptoms of a cold or flu. Do not treat yourself. This drug decreases your body's ability to fight infections. Try toavoid being around people who are sick. This medicine may increase your risk to bruise or bleed. Call your doctor orhealth care professional if you notice any unusual bleeding. Be careful brushing and flossing your teeth or using a toothpick because you may get an infection or bleed more easily. If you have any dental work done,tell your dentist you are receiving this medicine. Avoid taking products that contain aspirin, acetaminophen, ibuprofen, naproxen, or ketoprofen unless instructed by your doctor. These medicines may hide afever. Do not become pregnant while taking this medicine. Women should inform their doctor if they wish  to become pregnant or think they might be pregnant. There is a potential for serious side effects to an unborn child. Talk to your health care professional or pharmacist for more information. Do not breast-feed aninfant while taking this medicine. Men should inform their doctor if they wish to father a child. This medicinemay lower sperm counts. Do not treat diarrhea with over the counter products. Contact your doctor ifyou have diarrhea that lasts more than 2 days or if it is severe and watery. This medicine can make you more sensitive to the sun. Keep out of the sun. If you cannot avoid being in the sun, wear protective clothing and use sunscreen.Do not use sun lamps or tanning  beds/booths. What side effects may I notice from receiving this medication? Side effects that you should report to your doctor or health care professionalas soon as possible: allergic reactions like skin rash, itching or hives, swelling of the face, lips, or tongue low blood counts - this medicine may decrease the number of white blood cells, red blood cells and platelets. You may be at increased risk for infections and bleeding. signs of infection - fever or chills, cough, sore throat, pain or difficulty passing urine signs of decreased platelets or bleeding - bruising, pinpoint red spots on the skin, black, tarry stools, blood in the urine signs of decreased red blood cells - unusually weak or tired, fainting spells, lightheadedness breathing problems changes in vision chest pain mouth sores nausea and vomiting pain, swelling, redness at site where injected pain, tingling, numbness in the hands or feet redness, swelling, or sores on hands or feet stomach pain unusual bleeding Side effects that usually do not require medical attention (report to yourdoctor or health care professional if they continue or are bothersome): changes in finger or toe nails diarrhea dry or itchy skin hair loss headache loss of appetite sensitivity of eyes to the light stomach upset unusually teary eyes This list may not describe all possible side effects. Call your doctor for medical advice about side effects. You may report side effects to FDA at1-800-FDA-1088. Where should I keep my medication? This drug is given in a hospital or clinic and will not be stored at home. NOTE: This sheet is a summary. It may not cover all possible information. If you have questions about this medicine, talk to your doctor, pharmacist, orhealth care provider.  2022 Elsevier/Gold Standard (2019-01-18 15:00:03)

## 2020-10-11 ENCOUNTER — Telehealth: Payer: Self-pay | Admitting: *Deleted

## 2020-10-11 LAB — T4: T4, Total: 7.4 ug/dL (ref 4.5–12.0)

## 2020-10-12 ENCOUNTER — Inpatient Hospital Stay: Payer: Medicare PPO

## 2020-10-12 ENCOUNTER — Ambulatory Visit: Payer: Medicare PPO

## 2020-10-12 ENCOUNTER — Other Ambulatory Visit: Payer: Self-pay

## 2020-10-12 VITALS — BP 152/77 | HR 78 | Temp 98.3°F | Resp 20

## 2020-10-12 DIAGNOSIS — C20 Malignant neoplasm of rectum: Secondary | ICD-10-CM

## 2020-10-12 DIAGNOSIS — Z5112 Encounter for antineoplastic immunotherapy: Secondary | ICD-10-CM | POA: Diagnosis not present

## 2020-10-12 MED ORDER — HEPARIN SOD (PORK) LOCK FLUSH 100 UNIT/ML IV SOLN
500.0000 [IU] | Freq: Once | INTRAVENOUS | Status: AC | PRN
  Administered 2020-10-12: 500 [IU]
  Filled 2020-10-12: qty 5

## 2020-10-12 MED ORDER — SODIUM CHLORIDE 0.9% FLUSH
10.0000 mL | INTRAVENOUS | Status: DC | PRN
Start: 2020-10-12 — End: 2020-10-12
  Administered 2020-10-12: 10 mL
  Filled 2020-10-12: qty 10

## 2020-10-15 ENCOUNTER — Other Ambulatory Visit: Payer: Medicare PPO

## 2020-10-15 ENCOUNTER — Ambulatory Visit: Payer: Medicare PPO

## 2020-10-15 ENCOUNTER — Ambulatory Visit: Payer: Medicare PPO | Admitting: Hematology and Oncology

## 2020-10-17 ENCOUNTER — Other Ambulatory Visit: Payer: Self-pay

## 2020-10-17 DIAGNOSIS — C20 Malignant neoplasm of rectum: Secondary | ICD-10-CM

## 2020-10-17 NOTE — Progress Notes (Signed)
Occupational Therapy order entered. Will fax to [facility] once MD signs.

## 2020-10-22 ENCOUNTER — Other Ambulatory Visit: Payer: Medicare PPO

## 2020-10-22 ENCOUNTER — Ambulatory Visit: Payer: Medicare PPO

## 2020-10-22 ENCOUNTER — Ambulatory Visit: Payer: Medicare PPO | Admitting: Hematology and Oncology

## 2020-10-23 ENCOUNTER — Encounter: Payer: Self-pay | Admitting: Hematology and Oncology

## 2020-10-23 ENCOUNTER — Other Ambulatory Visit: Payer: Self-pay

## 2020-10-23 ENCOUNTER — Inpatient Hospital Stay: Payer: Medicare PPO | Admitting: Hematology and Oncology

## 2020-10-23 ENCOUNTER — Inpatient Hospital Stay: Payer: Medicare PPO

## 2020-10-23 DIAGNOSIS — D500 Iron deficiency anemia secondary to blood loss (chronic): Secondary | ICD-10-CM

## 2020-10-23 DIAGNOSIS — C20 Malignant neoplasm of rectum: Secondary | ICD-10-CM

## 2020-10-23 DIAGNOSIS — R197 Diarrhea, unspecified: Secondary | ICD-10-CM | POA: Diagnosis not present

## 2020-10-23 DIAGNOSIS — Z5112 Encounter for antineoplastic immunotherapy: Secondary | ICD-10-CM | POA: Diagnosis not present

## 2020-10-23 DIAGNOSIS — Z95828 Presence of other vascular implants and grafts: Secondary | ICD-10-CM

## 2020-10-23 LAB — CBC WITH DIFFERENTIAL (CANCER CENTER ONLY)
Abs Immature Granulocytes: 0.01 10*3/uL (ref 0.00–0.07)
Basophils Absolute: 0 10*3/uL (ref 0.0–0.1)
Basophils Relative: 0 %
Eosinophils Absolute: 0 10*3/uL (ref 0.0–0.5)
Eosinophils Relative: 1 %
HCT: 27.3 % — ABNORMAL LOW (ref 39.0–52.0)
Hemoglobin: 9.2 g/dL — ABNORMAL LOW (ref 13.0–17.0)
Immature Granulocytes: 0 %
Lymphocytes Relative: 9 %
Lymphs Abs: 0.4 10*3/uL — ABNORMAL LOW (ref 0.7–4.0)
MCH: 27.2 pg (ref 26.0–34.0)
MCHC: 33.7 g/dL (ref 30.0–36.0)
MCV: 80.8 fL (ref 80.0–100.0)
Monocytes Absolute: 1 10*3/uL (ref 0.1–1.0)
Monocytes Relative: 21 %
Neutro Abs: 3.2 10*3/uL (ref 1.7–7.7)
Neutrophils Relative %: 69 %
Platelet Count: 281 10*3/uL (ref 150–400)
RBC: 3.38 MIL/uL — ABNORMAL LOW (ref 4.22–5.81)
RDW: 22.7 % — ABNORMAL HIGH (ref 11.5–15.5)
WBC Count: 4.6 10*3/uL (ref 4.0–10.5)
nRBC: 0 % (ref 0.0–0.2)

## 2020-10-23 LAB — CMP (CANCER CENTER ONLY)
ALT: 17 U/L (ref 0–44)
AST: 21 U/L (ref 15–41)
Albumin: 3.3 g/dL — ABNORMAL LOW (ref 3.5–5.0)
Alkaline Phosphatase: 79 U/L (ref 38–126)
Anion gap: 8 (ref 5–15)
BUN: 13 mg/dL (ref 8–23)
CO2: 26 mmol/L (ref 22–32)
Calcium: 9 mg/dL (ref 8.9–10.3)
Chloride: 92 mmol/L — ABNORMAL LOW (ref 98–111)
Creatinine: 0.79 mg/dL (ref 0.61–1.24)
GFR, Estimated: >60 mL/min (ref >60)
Glucose, Bld: 106 mg/dL — ABNORMAL HIGH (ref 70–99)
Potassium: 4.4 mmol/L (ref 3.5–5.1)
Sodium: 126 mmol/L — ABNORMAL LOW (ref 135–145)
Total Bilirubin: 0.4 mg/dL (ref 0.3–1.2)
Total Protein: 6.2 g/dL — ABNORMAL LOW (ref 6.5–8.1)

## 2020-10-23 LAB — CEA (IN HOUSE-CHCC): CEA (CHCC-In House): 6.89 ng/mL — ABNORMAL HIGH (ref 0.00–5.00)

## 2020-10-23 LAB — TSH: TSH: 1.495 u[IU]/mL (ref 0.320–4.118)

## 2020-10-23 MED ORDER — FAMOTIDINE 20 MG IN NS 100 ML IVPB
20.0000 mg | Freq: Once | INTRAVENOUS | Status: AC
  Administered 2020-10-23: 20 mg via INTRAVENOUS
  Filled 2020-10-23: qty 100

## 2020-10-23 MED ORDER — DEXTROSE 5 % IV SOLN: Freq: Once | INTRAVENOUS | Status: DC

## 2020-10-23 MED ORDER — SODIUM CHLORIDE 0.9 % IV SOLN: Freq: Once | INTRAVENOUS | Status: AC

## 2020-10-23 MED ORDER — SODIUM CHLORIDE 0.9% FLUSH: 10.0000 mL | INTRAVENOUS | Status: DC | PRN

## 2020-10-23 MED ORDER — HEPARIN SOD (PORK) LOCK FLUSH 100 UNIT/ML IV SOLN: 500.0000 [IU] | Freq: Once | INTRAVENOUS | Status: DC | PRN

## 2020-10-23 MED ORDER — FLUOROURACIL CHEMO INJECTION 2.5 GM/50ML
400.0000 mg/m2 | Freq: Once | INTRAVENOUS | Status: AC
  Administered 2020-10-23: 750 mg via INTRAVENOUS
  Filled 2020-10-23: qty 15

## 2020-10-23 MED ORDER — SODIUM CHLORIDE 0.9 % IV SOLN
240.0000 mg | Freq: Once | INTRAVENOUS | Status: AC
  Administered 2020-10-23: 240 mg via INTRAVENOUS
  Filled 2020-10-23: qty 24

## 2020-10-23 MED ORDER — SODIUM CHLORIDE 0.9 % IV SOLN
2400.0000 mg/m2 | INTRAVENOUS | Status: DC
  Administered 2020-10-23: 4400 mg via INTRAVENOUS
  Filled 2020-10-23: qty 88

## 2020-10-23 MED ORDER — SODIUM CHLORIDE 0.9% FLUSH
10.0000 mL | INTRAVENOUS | Status: AC | PRN
  Administered 2020-10-23: 10 mL

## 2020-10-23 MED ORDER — ONDANSETRON HCL 4 MG/2ML IJ SOLN
8.0000 mg | Freq: Once | INTRAMUSCULAR | Status: AC
  Administered 2020-10-23: 8 mg via INTRAVENOUS
  Filled 2020-10-23: qty 4

## 2020-10-23 MED ORDER — LEUCOVORIN CALCIUM INJECTION 350 MG
400.0000 mg/m2 | Freq: Once | INTRAVENOUS | Status: AC
  Administered 2020-10-23: 736 mg via INTRAVENOUS
  Filled 2020-10-23: qty 36.8

## 2020-10-23 NOTE — Assessment & Plan Note (Signed)
This is a very pleasant 87 year old male patient with adenocarcinoma rectum, 2 synchronous tumors, microsatellite instability noticed in 1 month as well as a second mass was microsatellite stable referred to medical oncology for recommendations.  Given his age, he was not thought to be a great candidate for surgery at this time.  We have discussed him in the GI tumor board and recommendation was to consider chemotherapy or chemoimmunotherapy and assess response after few cycles and consider him for chemoradiation depending on the response and tolerance. I have discussed that this is an unusual circumstance where we cannot proceed with full dose chemotherapy given his age and also given the microsatellite instability noticed in one of the tumors, propose trying combination of 5-FU and nivolumab which has been approved in other cancers and was thought to have a decent safety profile. He was agreeable, received cycle 1 day 1 and tolerated it quite well.  He complains of some fatigue some dizziness which is not persistent.  He has baseline diarrhea which has not quite worsened. His labs from today are satisfactory except for hyponatremia and hypochloremia which is likely from dehydration.  We will proceed with chemo as planned and also give him 500 mL of normal saline during his infusion.

## 2020-10-23 NOTE — Progress Notes (Signed)
[facility]   [phone] Fax:([phone]) [phone]   Clinic Follow up Note   Patient Care Team: [name], MD as PCP - General (Internal Medicine) [facility], Curt Bears, MD as Consulting Physician (Ophthalmology) Benay Pike, MD as Consulting Physician (Hematology and Oncology) 10/23/2020  CHIEF COMPLAINT: f/u rectal cancer    ASSESSMENT & PLAN:  87 yo male   Adenocarcinoma rectum, synchronous tumors, T3N1, MSI high in one mass, MSS in second mass Moderate anemia secondary to iron deficiency and GI bleeding, status post IV Venofer and blood transfusion Retinal detachment Chronic back pain  Adenocarcinoma of rectum [facility]) This is a very pleasant 87 year old male patient with adenocarcinoma rectum, 2 synchronous tumors, microsatellite instability noticed in 1 month as well as a second mass was microsatellite stable referred to medical oncology for recommendations.  Given his age, he was not thought to be a great candidate for surgery at this time.  We have discussed him in the GI tumor board and recommendation was to consider chemotherapy or chemoimmunotherapy and assess response after few cycles and consider him for chemoradiation depending on the response and tolerance. I have discussed that this is an unusual circumstance where we cannot proceed with full dose chemotherapy given his age and also given the microsatellite instability noticed in one of the tumors, propose trying combination of 5-FU and nivolumab which has been approved in other cancers and was thought to have a decent safety profile. He was agreeable, received cycle 1 day 1 and tolerated it quite well.  He complains of some fatigue some dizziness which is not persistent.  He has baseline diarrhea which has not quite worsened. His labs from today are satisfactory except for hyponatremia and hypochloremia which is likely from dehydration.  We will proceed with chemo as  planned and also give him 500 mL of normal saline during his infusion.  Diarrhea Diarrhea likely related to the rectal adenocarcinoma.  He has about 5-10 bowel movements, semiformed, stool size varies. He denies any abdominal pain, nausea or vomiting with it.  I recommended trying a dose of Imodium to see if the diarrhea is controlled better. Will continue to monitor this.  Iron deficiency anemia Iron deficiency anemia secondary to bleed from adenocarcinoma of the rectum.  He received iron infusion last month and tolerated it very well. His hemoglobin from today shows a minor decline, we will continue to monitor this. No indication for transfusion today   SUMMARY OF ONCOLOGIC HISTORY: Oncology History  Iron deficiency anemia  Adenocarcinoma of rectum ([place])  09/06/2020 Initial Diagnosis   Adenocarcinoma of rectum ([place])   09/20/2020 Cancer Staging   Staging form: Colon and Rectum, AJCC 8th Edition - Clinical: Stage IIIB (cT3, cN1b, cM0) - Signed by Benay Pike, MD on 09/20/2020   09/26/2020 - 09/26/2020 Chemotherapy          10/10/2020 -  Chemotherapy    Patient is on Treatment Plan: RECTAL 5 FU+ NIVOLUMAB Q14D        CURRENT THERAPY: 5-fu pump infusion and nivolumab every 2 weeks, starting October 11, 2018  INTERVAL HISTORY:  Mr. Skare returns for follow-up after cycle 1. He is doing well today.  After first visit of chemotherapy, he did feel tired, had to use his walker a bit more, also noticed a day of dizziness in the past 2 weeks.  The dizziness was not particularly positional.  He does not have any dizziness today.  He is apprehensive about planned chemotherapy  today.  He does have baseline diarrhea about 5-10 bowel movements a day which was present even before the initiation of chemo.  He describes them as semiformed stool, no nausea, vomiting or abdominal pain, denies any blood in his stool.  No change in his urinary habits.  No cough, chest pain or shortness of  breath.  All other systems were reviewed with the patient and are negative.  MEDICAL HISTORY:  Past Medical History:  Diagnosis Date   Actinic keratosis 04/05/2012   Insomnia, unspecified 06/08/2008   Lumbago 01/02/2003   Neoplasm of uncertain behavior of skin 04/05/2012   Pain in limb 02/05/2011   Retinal detachment 2000   left eye   Screening cholesterol level    Shortness of breath 09/07/2008   Vitamin D deficiency 08/04/2012    SURGICAL HISTORY: Past Surgical History:  Procedure Laterality Date   BIOPSY  08/23/2020   Procedure: BIOPSY;  Surgeon: Doran Stabler, MD;  Location: [facility];  Service: Gastroenterology;;   COLONOSCOPY WITH PROPOFOL N/A 08/23/2020   Procedure: COLONOSCOPY WITH PROPOFOL;  Surgeon: Doran Stabler, MD;  Location: [facility];  Service: Gastroenterology;  Laterality: N/A;   ESOPHAGOGASTRODUODENOSCOPY (EGD) WITH PROPOFOL N/A 08/23/2020   Procedure: ESOPHAGOGASTRODUODENOSCOPY (EGD) WITH PROPOFOL;  Surgeon: Doran Stabler, MD;  Location: [facility];  Service: Gastroenterology;  Laterality: N/A;   EXTERNAL EAR SURGERY  05/2012   left ear    INGUINAL HERNIA REPAIR Right 01/21/2005   Dr. [name]   IR IMAGING GUIDED PORT INSERTION  10/09/2020   LID LESION EXCISION  2009   MOHS SURGERY Left 05/28/2012   ear lobe Dr. [name]   RETINAL DETACHMENT SURGERY Left 1997   [facility]   SUBMUCOSAL INJECTION  08/23/2020   Procedure: SUBMUCOSAL INJECTION;  Surgeon: Doran Stabler, MD;  Location: [facility];  Service: Gastroenterology;;    I have reviewed the social history and family history with the patient and they are unchanged from previous note.  ALLERGIES:  is allergic to benzalkonium chloride, maxitrol [neomycin-polymyxin-dexameth], and neosporin [neomycin-bacitracin zn-polymyx].  MEDICATIONS:  Current Outpatient Medications  Medication Sig Dispense Refill   clotrimazole (LOTRIMIN) 1 % cream Apply 1 application topically 2 (two) times daily. To left  knee for two dry scaly areas until resolved 40 g 0   dexamethasone (DECADRON) 4 MG tablet Take 2 tablets (8 mg total) by mouth daily. Start the day after chemotherapy for 2 days. Take with food. 30 tablet 1   esomeprazole (NEXIUM) 40 MG capsule Take 1 capsule (40 mg total) by mouth daily. 30 capsule 3   iron polysaccharides (NU-IRON) 150 MG capsule Take 1 capsule (150 mg total) by mouth daily. 30 capsule 3   lidocaine-prilocaine (EMLA) cream Apply to affected area once 30 g 3   ondansetron (ZOFRAN) 8 MG tablet Take 1 tablet (8 mg total) by mouth 2 (two) times daily as needed for refractory nausea / vomiting. Start on day 3 after chemotherapy. 30 tablet 1   Polyethyl Glycol-Propyl Glycol (SYSTANE) 0.4-0.3 % SOLN Place 1 drop into both eyes daily as needed (dry eyes).     prochlorperazine (COMPAZINE) 10 MG tablet Take 1 tablet (10 mg total) by mouth every 6 (six) hours as needed (Nausea or vomiting). 30 tablet 1   vitamin B-12 (CYANOCOBALAMIN) 1000 MCG tablet Take 1 Tablet Daily 100 tablet 1   Vitamin D, Ergocalciferol, (DRISDOL) 1.25 MG (50000 UNIT) CAPS capsule TAKE ONE CAPSULE ONCE A WEEK FOR VITAMIN D SUPPLEMENT. (Patient  taking differently: Take 50,000 Units by mouth every Tuesday. TAKE ONE CAPSULE ONCE A WEEK FOR VITAMIN D SUPPLEMENT.) 4 capsule 1   No current facility-administered medications for this visit.   Facility-Administered Medications Ordered in Other Visits  Medication Dose Route Frequency Provider Last Rate Last Admin   dextrose 5 % solution   Intravenous Once Raeana Blinn, MD       fluorouracil (ADRUCIL) 4,400 mg in sodium chloride 0.9 % 62 mL chemo infusion  2,400 mg/m2 (Treatment Plan Recorded) Intravenous 1 day or 1 dose Jonnie Kubly, MD       fluorouracil (ADRUCIL) chemo injection 750 mg  400 mg/m2 (Treatment Plan Recorded) Intravenous Once Jovan Schickling, Arletha Pili, MD       heparin lock flush 100 unit/mL  500 Units Intracatheter Once PRN Puneet Selden, MD       sodium chloride  flush (NS) 0.9 % injection 10 mL  10 mL Intracatheter PRN Mir Fullilove, MD        PHYSICAL EXAMINATION: ECOG PERFORMANCE STATUS: 2 - Symptomatic, <50% confined to bed  Vitals:   10/23/20 0922  BP: (!) 146/74  Pulse: 69  Resp: 19  Temp: (!) 97.5 F (36.4 C)  SpO2: 97%   Filed Weights   10/23/20 0922  Weight: 147 lb 1.6 oz (66.7 kg)    GENERAL:alert, no distress and comfortable SKIN: skin color, texture, turgor are normal, no rashes or significant lesions EYES: normal, Conjunctiva are pink and non-injected, sclera clear OROPHARYNX:no exudate, no erythema and lips, buccal mucosa, and tongue normal  NECK: supple, thyroid normal size, non-tender, without nodularity LYMPH:  no palpable lymphadenopathy in the cervical, axillary  LUNGS: clear to auscultation and percussion with normal breathing effort HEART: regular rate & rhythm and no murmurs . BLE 1+ at baseline ABDOMEN:abdomen soft, non-tender and normal bowel sounds Musculoskeletal:no cyanosis of digits and no clubbing  NEURO: alert & oriented x 3 with fluent speech, no focal motor/sensory deficits  LABORATORY DATA:  I have reviewed the data as listed CBC Latest Ref Rng & Units 10/23/2020 10/10/2020 10/09/2020  WBC 4.0 - 10.5 K/uL 4.6 6.8 7.6  Hemoglobin 13.0 - 17.0 g/dL 9.2(L) 9.9(L) 10.6(L)  Hematocrit 39.0 - 52.0 % 27.3(L) 31.2(L) 34.2(L)  Platelets 150 - 400 K/uL 281 302 357     CMP Latest Ref Rng & Units 10/23/2020 10/10/2020 10/03/2020  Glucose 70 - 99 mg/dL 106(H) 110(H) 112(H)  BUN 8 - 23 mg/dL _0 Creatinine 0.61 - 1.24 mg/dL 0.79 0.76 0.86  Sodium 135 - 145 mmol/L 126(L) 133(L) 132(L)  Potassium 3.5 - 5.1 mmol/L 4.4 4.4 4.5  Chloride 98 - 111 mmol/L 92(L) 100 100  CO2 22 - 32 mmol/L _1 Calcium 8.9 - 10.3 mg/dL 9.0 9.3 9.1  Total Protein 6.5 - 8.1 g/dL 6.2(L) 6.5 6.4(L)  Total Bilirubin 0.3 - 1.2 mg/dL 0.4 0.6 0.3  Alkaline Phos 38 - 126 U/L 79 79 74  AST 15 - 41 U/L _2 ALT 0 - 44 U/L _3 RADIOGRAPHIC STUDIES: I have personally reviewed the radiological images as listed and agreed with the findings in the report. No results found.    No orders of the defined types were placed in this encounter.  All questions were answered. The patient knows to call the clinic with any problems, questions or concerns. No barriers to learning was detected. I spent 30 minutes counseling the patient face to face. The total  time spent in the appointment was 30 minutes and more than 50% was on counseling and review of test results     Benay Pike, MD 10/23/20

## 2020-10-23 NOTE — Patient Instructions (Signed)
[facility]  Discharge Instructions: Thank you for choosing [facility] to provide your oncology and hematology care.   If you have a lab appointment with the [facility], please go directly to the [facility] and check in at the registration area.   Wear comfortable clothing and clothing appropriate for easy access to any Portacath or PICC line.   We strive to give you quality time with your provider. You may need to reschedule your appointment if you arrive late (15 or more minutes).  Arriving late affects you and other patients whose appointments are after yours.  Also, if you miss three or more appointments without notifying the office, you may be dismissed from the clinic at the provider's discretion.      For prescription refill requests, have your pharmacy contact our office and allow 72 hours for refills to be completed.    Today you received the following chemotherapy and/or immunotherapy agents : opdivo, leucovorin, 5FU     To help prevent nausea and vomiting after your treatment, we encourage you to take your nausea medication as directed.  BELOW ARE SYMPTOMS THAT SHOULD BE REPORTED IMMEDIATELY: *FEVER GREATER THAN 100.4 F (38 C) OR HIGHER *CHILLS OR SWEATING *NAUSEA AND VOMITING THAT IS NOT CONTROLLED WITH YOUR NAUSEA MEDICATION *UNUSUAL SHORTNESS OF BREATH *UNUSUAL BRUISING OR BLEEDING *URINARY PROBLEMS (pain or burning when urinating, or frequent urination) *BOWEL PROBLEMS (unusual diarrhea, constipation, pain near the anus) TENDERNESS IN MOUTH AND THROAT WITH OR WITHOUT PRESENCE OF ULCERS (sore throat, sores in mouth, or a toothache) UNUSUAL RASH, SWELLING OR PAIN  UNUSUAL VAGINAL DISCHARGE OR ITCHING   Items with * indicate a potential emergency and should be followed up as soon as possible or go to the Emergency Department if any problems should occur.  Please show the CHEMOTHERAPY ALERT CARD or IMMUNOTHERAPY ALERT CARD  at check-in to the Emergency Department and triage nurse.  Should you have questions after your visit or need to cancel or reschedule your appointment, please contact [facility]  Dept: [phone]  and follow the prompts.  Office hours are 8:00 a.m. to 4:30 p.m. Monday - Friday. Please note that voicemails left after 4:00 p.m. may not be returned until the following business day.  We are closed weekends and major holidays. You have access to a nurse at all times for urgent questions. Please call the main number to the clinic Dept: [phone] and follow the prompts.   For any non-urgent questions, you may also contact your provider using MyChart. We now offer e-Visits for anyone 85 and older to request care online for non-urgent symptoms. For details visit [email or web address].   Also download the MyChart app! Go to the app store, search "MyChart", open the app, select [facility], and log in with your MyChart username and password.  Due to Covid, a mask is required upon entering the hospital/clinic. If you do not have a mask, one will be given to you upon arrival. For doctor visits, patients may have 1 support person aged 85 or older with them. For treatment visits, patients cannot have anyone with them due to current Covid guidelines and our immunocompromised population.

## 2020-10-23 NOTE — Assessment & Plan Note (Signed)
Diarrhea likely related to the rectal adenocarcinoma.  He has about 5-10 bowel movements, semiformed, stool size varies. He denies any abdominal pain, nausea or vomiting with it.  I recommended trying a dose of Imodium to see if the diarrhea is controlled better. Will continue to monitor this.

## 2020-10-23 NOTE — Progress Notes (Signed)
[name] made aware of sodium 126. Administered extra fluids per MD order.

## 2020-10-23 NOTE — Assessment & Plan Note (Signed)
Iron deficiency anemia secondary to bleed from adenocarcinoma of the rectum.  He received iron infusion last month and tolerated it very well. His hemoglobin from today shows a minor decline, we will continue to monitor this. No indication for transfusion today

## 2020-10-24 LAB — T4: T4, Total: 8.5 ug/dL (ref 4.5–12.0)

## 2020-10-25 ENCOUNTER — Other Ambulatory Visit: Payer: Self-pay

## 2020-10-25 ENCOUNTER — Inpatient Hospital Stay: Payer: Medicare PPO

## 2020-10-25 VITALS — BP 154/74 | HR 70 | Temp 98.2°F | Resp 18

## 2020-10-25 DIAGNOSIS — C20 Malignant neoplasm of rectum: Secondary | ICD-10-CM

## 2020-10-25 DIAGNOSIS — Z5112 Encounter for antineoplastic immunotherapy: Secondary | ICD-10-CM | POA: Diagnosis not present

## 2020-10-25 MED ORDER — SODIUM CHLORIDE 0.9% FLUSH
10.0000 mL | INTRAVENOUS | Status: DC | PRN
Start: 2020-10-25 — End: 2020-10-25
  Administered 2020-10-25: 10 mL

## 2020-10-25 MED ORDER — HEPARIN SOD (PORK) LOCK FLUSH 100 UNIT/ML IV SOLN
500.0000 [IU] | Freq: Once | INTRAVENOUS | Status: AC | PRN
  Administered 2020-10-25: 500 [IU]

## 2020-10-26 ENCOUNTER — Telehealth: Payer: Self-pay | Admitting: Genetic Counselor

## 2020-10-26 ENCOUNTER — Encounter: Payer: Self-pay | Admitting: Genetic Counselor

## 2020-10-26 ENCOUNTER — Ambulatory Visit: Payer: Self-pay | Admitting: Genetic Counselor

## 2020-10-26 DIAGNOSIS — Z1379 Encounter for other screening for genetic and chromosomal anomalies: Secondary | ICD-10-CM | POA: Insufficient documentation

## 2020-10-26 DIAGNOSIS — C20 Malignant neoplasm of rectum: Secondary | ICD-10-CM

## 2020-10-26 NOTE — Telephone Encounter (Signed)
Revealed genetic testing results to Alec Weiss's daughter, Alec Weiss, per Alec Weiss request.  Discussed no hereditary cause for colon cancer or family history of breast cancer was identified.  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  Discussed testing Alec Weiss's children is available if they are interested.

## 2020-10-28 ENCOUNTER — Other Ambulatory Visit: Payer: Self-pay | Admitting: Internal Medicine

## 2020-10-29 ENCOUNTER — Ambulatory Visit: Payer: Medicare PPO | Admitting: Hematology and Oncology

## 2020-10-29 ENCOUNTER — Other Ambulatory Visit: Payer: Medicare PPO

## 2020-10-29 ENCOUNTER — Ambulatory Visit: Payer: Medicare PPO

## 2020-10-30 ENCOUNTER — Encounter: Payer: Self-pay | Admitting: Hematology and Oncology

## 2020-10-31 ENCOUNTER — Telehealth: Payer: Self-pay | Admitting: Hematology and Oncology

## 2020-10-31 NOTE — Telephone Encounter (Signed)
Scheduled per  sch msg. Called and spoke with patients daughter. Confirmed appt  

## 2020-11-01 ENCOUNTER — Non-Acute Institutional Stay (SKILLED_NURSING_FACILITY): Payer: Medicare PPO | Admitting: Adult Health

## 2020-11-01 ENCOUNTER — Encounter: Payer: Self-pay | Admitting: Adult Health

## 2020-11-01 ENCOUNTER — Inpatient Hospital Stay: Payer: Medicare PPO | Attending: Hematology and Oncology | Admitting: Hematology and Oncology

## 2020-11-01 ENCOUNTER — Encounter: Payer: Self-pay | Admitting: Hematology and Oncology

## 2020-11-01 DIAGNOSIS — R197 Diarrhea, unspecified: Secondary | ICD-10-CM | POA: Diagnosis not present

## 2020-11-01 DIAGNOSIS — D500 Iron deficiency anemia secondary to blood loss (chronic): Secondary | ICD-10-CM | POA: Diagnosis not present

## 2020-11-01 DIAGNOSIS — C20 Malignant neoplasm of rectum: Secondary | ICD-10-CM

## 2020-11-01 DIAGNOSIS — R634 Abnormal weight loss: Secondary | ICD-10-CM

## 2020-11-01 DIAGNOSIS — R296 Repeated falls: Secondary | ICD-10-CM

## 2020-11-01 DIAGNOSIS — E871 Hypo-osmolality and hyponatremia: Secondary | ICD-10-CM | POA: Diagnosis not present

## 2020-11-01 DIAGNOSIS — R531 Weakness: Secondary | ICD-10-CM

## 2020-11-01 LAB — HEPATIC FUNCTION PANEL
ALT: 21 (ref 10–40)
AST: 17 (ref 14–40)
Alkaline Phosphatase: 89 (ref 25–125)
Bilirubin, Total: 0.4

## 2020-11-01 LAB — COMPREHENSIVE METABOLIC PANEL
Albumin: 3.6 (ref 3.5–5.0)
Calcium: 8.8 (ref 8.7–10.7)

## 2020-11-01 LAB — BASIC METABOLIC PANEL
BUN: 14 (ref 4–21)
CO2: 27 — AB (ref 13–22)
Chloride: 96 — AB (ref 99–108)
Creatinine: 0.8 (ref 0.6–1.3)
Glucose: 79
Potassium: 4.3 (ref 3.4–5.3)
Sodium: 131 — AB (ref 137–147)

## 2020-11-01 LAB — CBC AND DIFFERENTIAL
HCT: 30 — AB (ref 41–53)
Hemoglobin: 10.1 — AB (ref 13.5–17.5)
Platelets: 369 (ref 150–399)
WBC: 6.5

## 2020-11-01 LAB — CBC: RBC: 3.68 — AB (ref 3.87–5.11)

## 2020-11-01 NOTE — Progress Notes (Signed)
Location:  [place] Room Number: 156-A Place of Service:  SNF [phone]) Provider:  Royal Hawthorn, NP   Patient Care Team: [name], MD as PCP - General (Internal Medicine) [facility] [name], MD as Consulting Physician (Ophthalmology) Benay Pike, MD as Consulting Physician (Hematology and Oncology)  Extended Emergency Contact Information Primary Emergency Contact: [place], [place] [address] Johnnette Litter of [place] Phone: [phone] Mobile Phone: [phone] Relation: Daughter  Code Status:  DNR Goals of care: Advanced Directive information Advanced Directives 11/01/2020  Does Patient Have a Medical Advance Directive? Yes  Type of Advance Directive Living will;Out of facility DNR (pink MOST or yellow form)  Does patient want to make changes to medical advance directive? No - Patient declined  Copy of [place] in Chart? -  Would patient like information on creating a medical advance directive? -  Pre-existing out of facility DNR order (yellow form or pink MOST form) Pink MOST form placed in chart (order not valid for inpatient use)     Chief Complaint  Patient presents with   Acute Visit    Admit to rehab following a fall    HPI:  Pt is a 87 y.o. male seen today for an acute visit for admission to rehab after a fall with skin tears.  PMH significant for adenocarcinoma of the rectum, retinal detachment, vitamin D deficiency, iron deficiency anemia. He was admitted to [facility] from independent living due to weakness, diarrhea, and falls with skin tears.  He fell and sustained 3 skin tears to the left arm and 1 to the right arm.  He was having diarrhea and felt weak and is now taking Imodium which has helped.  Blood pressure is on the high side.  Weight is trending down to 142 pounds to decrease intake and volume loss.  He has received 2 cycles of  chemotherapy with 5-fluorouracil and Opdivo.  He is going to have a visit virtually with his oncologist and discuss whether he should continue treatment since his performance status is declining.  While he is here [facility] we are providing supportive care. He has received iron and blood transfusions due to iron deficiency anemia.  Last hemoglobin was 9.2 on August 23,2022.  Sodium was 126, August 2013 he received a bolus of saline at the cancer center.  At this time he is ambulatory with a walker but the nurses are noting he is more confused and thinks more reminders.  MMSE was 22 out of 30 Past Medical History:  Diagnosis Date   Actinic keratosis 04/05/2012   Insomnia, unspecified 06/08/2008   Lumbago 01/02/2003   Neoplasm of uncertain behavior of skin 04/05/2012   Pain in limb 02/05/2011   Retinal detachment 2000   left eye   Screening cholesterol level    Shortness of breath 09/07/2008   Vitamin D deficiency 08/04/2012   Past Surgical History:  Procedure Laterality Date   BIOPSY  08/23/2020   Procedure: BIOPSY;  Surgeon: Doran Stabler, MD;  Location: [facility];  Service: Gastroenterology;;   COLONOSCOPY WITH PROPOFOL N/A 08/23/2020   Procedure: COLONOSCOPY WITH PROPOFOL;  Surgeon: Doran Stabler, MD;  Location: [facility];  Service: Gastroenterology;  Laterality: N/A;   ESOPHAGOGASTRODUODENOSCOPY (EGD) WITH PROPOFOL N/A 08/23/2020   Procedure: ESOPHAGOGASTRODUODENOSCOPY (EGD) WITH PROPOFOL;  Surgeon: Doran Stabler, MD;  Location: [facility];  Service: Gastroenterology;  Laterality: N/A;  EXTERNAL EAR SURGERY  05/2012   left ear    INGUINAL HERNIA REPAIR Right 01/21/2005   Dr. [name]   IR IMAGING GUIDED PORT INSERTION  10/09/2020   LID LESION EXCISION  2009   MOHS SURGERY Left 05/28/2012   ear lobe Dr. [name]   RETINAL DETACHMENT SURGERY Left 1997   [facility]   SUBMUCOSAL INJECTION  08/23/2020   Procedure: SUBMUCOSAL INJECTION;  Surgeon: Doran Stabler, MD;  Location:  [facility];  Service: Gastroenterology;;    Allergies  Allergen Reactions   Benzalkonium Chloride Rash    Very allergic per patient    Maxitrol [Neomycin-Polymyxin-Dexameth] Itching and Rash   Neosporin [Neomycin-Bacitracin Zn-Polymyx] Itching and Rash    Outpatient Encounter Medications as of 11/01/2020  Medication Sig   dexamethasone (DECADRON) 4 MG tablet Take 2 tablets (8 mg total) by mouth daily. Start the day after chemotherapy for 2 days. Take with food.   esomeprazole (NEXIUM) 40 MG capsule TAKE ONE CAPSULE BY MOUTH DAILY   iron polysaccharides (NU-IRON) 150 MG capsule Take 1 capsule (150 mg total) by mouth daily.   loperamide (IMODIUM A-D) 2 MG tablet Take 2 mg by mouth 3 (three) times daily as needed for diarrhea or loose stools.   ondansetron (ZOFRAN) 8 MG tablet Take 1 tablet (8 mg total) by mouth 2 (two) times daily as needed for refractory nausea / vomiting. Start on day 3 after chemotherapy.   prochlorperazine (COMPAZINE) 10 MG tablet Take 1 tablet (10 mg total) by mouth every 6 (six) hours as needed (Nausea or vomiting).   vitamin B-12 (CYANOCOBALAMIN) 1000 MCG tablet Take 1 Tablet Daily   Vitamin D, Ergocalciferol, (DRISDOL) 1.25 MG (50000 UNIT) CAPS capsule TAKE ONE CAPSULE ONCE A WEEK FOR VITAMIN D SUPPLEMENT.   [DISCONTINUED] clotrimazole (LOTRIMIN) 1 % cream Apply 1 application topically 2 (two) times daily. To left knee for two dry scaly areas until resolved   [DISCONTINUED] lidocaine-prilocaine (EMLA) cream Apply to affected area once   [DISCONTINUED] Polyethyl Glycol-Propyl Glycol (SYSTANE) 0.4-0.3 % SOLN Place 1 drop into both eyes daily as needed (dry eyes).   No facility-administered encounter medications on file as of 11/01/2020.    Review of Systems  Constitutional:  Positive for activity change, appetite change, fatigue and unexpected weight change. Negative for chills, diaphoresis and fever.  HENT:  Negative for congestion.   Respiratory:  Negative for  cough, shortness of breath, wheezing and stridor.   Cardiovascular:  Positive for leg swelling. Negative for chest pain and palpitations.  Gastrointestinal:  Positive for diarrhea. Negative for abdominal distention, abdominal pain and constipation.  Genitourinary:  Negative for decreased urine volume, difficulty urinating, dysuria and urgency.  Musculoskeletal:  Positive for gait problem. Negative for arthralgias, back pain, joint swelling and myalgias.  Skin:  Positive for wound.  Neurological:  Positive for weakness. Negative for dizziness, tremors, seizures, syncope, facial asymmetry, speech difficulty and headaches.  Hematological:  Negative for adenopathy. Does not bruise/bleed easily.  Psychiatric/Behavioral:  Positive for confusion. Negative for agitation, behavioral problems and sleep disturbance. The patient is not nervous/anxious.    Immunization History  Administered Date(s) Administered   Hep A / Hep B 07/21/2001, 08/18/2001   Hepatitis A, Ped/Adol-2 Dose 01/24/2002   IPV 08/18/2001   Influenza Whole 12/02/2011, 12/31/2012   Influenza, High Dose Seasonal PF 12/10/2017, 12/10/2018, 12/30/2019   Influenza,inj,Quad PF,6+ Mos 12/20/2013   Influenza-Unspecified 12/21/2014, 12/27/2015, 12/22/2016, 12/10/2018   Moderna Sars-Covid-2 Vaccination 03/25/2019, 04/12/2019, 01/17/2020   Pneumococcal Conjugate-13 11/22/2014  Pneumococcal Polysaccharide-23 07/21/2001, 12/03/2016   Td 04/12/2009   Tdap 12/10/2017, 05/07/2020   Typhoid Inactivated 07/21/2001, 04/12/2009   Zoster, Live 02/01/2011   Pertinent  Health Maintenance Due  Topic Date Due   INFLUENZA VACCINE  11/01/2020 (Originally 10/01/2020)   PNA vac Low Risk Adult  Completed   Fall Risk  09/17/2020 02/08/2020 01/04/2020 12/21/2019 08/03/2019  Falls in the past year? 1 0 0 0 0  Number falls in past yr: 0 0 0 0 0  Comment - - - - -  Injury with Fall? 1 0 0 0 0  Comment - - - - -  Follow up Falls evaluation completed - - - -    Functional Status Survey:    Vitals:   11/01/20 1126  BP: (!) 149/87  Pulse: 77  Resp: 16  Temp: 97.9 F (36.6 C)  SpO2: 99%  Weight: 142 lb (64.4 kg)  Height: '5\' 11"'$  (1.803 m)   Body mass index is 19.8 kg/m. Physical Exam Vitals and nursing note reviewed.  Constitutional:      General: He is not in acute distress.    Appearance: He is not diaphoretic.  HENT:     Head: Normocephalic and atraumatic.     Nose: Nose normal. No congestion.     Mouth/Throat:     Mouth: Mucous membranes are moist.     Pharynx: Oropharynx is clear. No oropharyngeal exudate.  Eyes:     Conjunctiva/sclera: Conjunctivae normal.     Pupils: Pupils are equal, round, and reactive to light.  Neck:     Thyroid: No thyromegaly.     Vascular: No JVD.     Trachea: No tracheal deviation.  Cardiovascular:     Rate and Rhythm: Normal rate and regular rhythm.     Heart sounds: No murmur heard. Pulmonary:     Effort: Pulmonary effort is normal. No respiratory distress.     Breath sounds: Normal breath sounds. No wheezing.     Comments: Rhonchi present at first but cleared with a cough  Abdominal:     General: Bowel sounds are normal. There is no distension (mild).     Palpations: Abdomen is soft.     Tenderness: There is no abdominal tenderness.  Musculoskeletal:     Right lower leg: Edema (+1) present.     Left lower leg: Edema (+1) present.  Lymphadenopathy:     Cervical: No cervical adenopathy.  Skin:    General: Skin is warm and dry.     Comments: Skin tear to left forearm x 2 covered with polymem Skin tear to right forearm covered with polymem. No spreading redness or purulent drainage.  Scattered ecchymosis to BUE and BLE.   Neurological:     General: No focal deficit present.     Mental Status: He is alert and oriented to person, place, and time.     Cranial Nerves: No cranial nerve deficit.  Psychiatric:        Mood and Affect: Mood normal.    Labs reviewed: Recent Labs     10/03/20 1207 10/10/20 0943 10/23/20 0858  NA 132* 133* 126*  K 4.5 4.4 4.4  CL 100 100 92*  CO2 '25 25 26  '$ GLUCOSE 112* 110* 106*  BUN '13 15 13  '$ CREATININE 0.86 0.76 0.79  CALCIUM 9.1 9.3 9.0   Recent Labs    10/03/20 1207 10/10/20 0943 10/23/20 0858  AST '15 16 21  '$ ALT '12 12 17  '$ ALKPHOS 74 79 79  BILITOT 0.3 0.6 0.4  PROT 6.4* 6.5 6.2*  ALBUMIN 3.3* 3.3* 3.3*   Recent Labs    10/03/20 1207 10/09/20 1330 10/10/20 0943 10/23/20 0858  WBC 7.3 7.6 6.8 4.6  NEUTROABS 5.5  --  5.0 3.2  HGB 7.3* 10.6* 9.9* 9.2*  HCT 23.2* 34.2* 31.2* 27.3*  MCV 78.4* 84.0 83.0 80.8  PLT 354 357 302 281   Lab Results  Component Value Date   TSH 1.495 10/23/2020   Lab Results  Component Value Date   HGBA1C 5.9 03/09/2012   Lab Results  Component Value Date   CHOL 189 12/16/2018   HDL 69 12/16/2018   LDLCALC 111 12/16/2018   TRIG 45 12/16/2018    Significant Diagnostic Results in last 30 days:  IR IMAGING GUIDED PORT INSERTION  Result Date: 10/09/2020 CLINICAL DATA:  RECTAL ADENOCARCINOMA EXAM: RIGHT INTERNAL JUGULAR SINGLE LUMEN POWER PORT CATHETER INSERTION Date:  10/09/2020 10/09/2020 3:17 pm Radiologist:  Jerilynn Mages. Daryll Brod, MD Guidance:  Ultrasound and fluoroscopic MEDICATIONS: 1% lidocaine local ANESTHESIA/SEDATION: Versed 1.5 mg IV; Fentanyl 100 mcg IV; Moderate Sedation Time:  26 minutes The patient was continuously monitored during the procedure by the interventional radiology nurse under my direct supervision. FLUOROSCOPY TIME:  0 minutes, 42 seconds (3 mGy) COMPLICATIONS: None immediate. CONTRAST:  None. PROCEDURE: Informed consent was obtained from the patient following explanation of the procedure, risks, benefits and alternatives. The patient understands, agrees and consents for the procedure. All questions were addressed. A time out was performed. Maximal barrier sterile technique utilized including caps, mask, sterile gowns, sterile gloves, large sterile drape, hand hygiene, and  2% chlorhexidine scrub. Under sterile conditions and local anesthesia, right internal jugular micropuncture venous access was performed. Access was performed with ultrasound. Images were obtained for documentation of the patent right internal jugular vein. A guide wire was inserted followed by a transitional dilator. This allowed insertion of a guide wire and catheter into the IVC. Measurements were obtained from the SVC / RA junction back to the right IJ venotomy site. In the right infraclavicular chest, a subcutaneous pocket was created over the second anterior rib. This was done under sterile conditions and local anesthesia. 1% lidocaine with epinephrine was utilized for this. A 2.5 cm incision was made in the skin. Blunt dissection was performed to create a subcutaneous pocket over the right pectoralis major muscle. The pocket was flushed with saline vigorously. There was adequate hemostasis. The port catheter was assembled and checked for leakage. The port catheter was secured in the pocket with two retention sutures. The tubing was tunneled subcutaneously to the right venotomy site and inserted into the SVC/RA junction through a valved peel-away sheath. Position was confirmed with fluoroscopy. Images were obtained for documentation. The patient tolerated the procedure well. No immediate complications. Incisions were closed in a two layer fashion with 4 - 0 Vicryl suture. Dermabond was applied to the skin. The port catheter was accessed, blood was aspirated followed by saline and heparin flushes. Needle was removed. A dry sterile dressing was applied. IMPRESSION: Ultrasound and fluoroscopically guided right internal jugular single lumen power port catheter insertion. Tip in the SVC/RA junction. Catheter ready for use. Electronically Signed   By: Jerilynn Mages.  Shick M.D.   On: 10/09/2020 15:32    Assessment/Plan  1. Adenocarcinoma of rectum ([place]) Has received two cycles of chemotherapy with portacath in place and has  declined since that time.  Consulting with oncology  We discussed the concept of quality of life vs quantity  and he will consider this and discuss his treatment option with oncology. Seems to weak to move forward at this time.   2. Weight loss Due to diarrhea, adenocarcinoma, and s/e of chemo Recommend nutritional consult Will check albumin   3. Diarrhea, unspecified type Improving with imodium Encourage oral hydration   4. Hyponatremia Needs follow up electrolyte check   5. Weakness Will need rehab and therapy   6. Frequent falls Needs to use his walker at all times Falls are likely due to weakness associated with treatment and dehydration   7. IDA Due to blood loss associated with ca Continues on iron supplementation Check CBC  Family/ staff Communication: discussed with resident   Labs/tests ordered:  CBC CMP

## 2020-11-01 NOTE — Progress Notes (Signed)
[name]   [phone] [phone]   Clinic Follow up Note   Patient Care Team: [name], MD as PCP - General (Internal Medicine) [facility] P[facility]ini, Curt Bears, MD as Consulting Physician (Ophthalmology) Benay Pike, MD as Consulting Physician (Hematology and Oncology) 11/01/2020  CHIEF COMPLAINT: f/u rectal cancer   ASSESSMENT & PLAN:   87 yo male   Adenocarcinoma rectum, synchronous tumors, T3N1, MSI high in one mass, MSS in second mass Moderate anemia secondary to iron deficiency and GI bleeding, status post IV Venofer and blood transfusion Retinal detachment Chronic back pain  This is a very pleasant 87 year old male patient with T3N1 synchronous tumors of the rectum, MSS in one tumor and MSI H in second tumor who is on 5-fluorouracil and Opdivo for neoadjuvant treatment here for video visit after second cycle of chemotherapy.  He tells me that the second cycle made him very tired and he fell a couple times, has not been eating well, has lost weight and he mentioned that he would not like to experience this again if possible. He also had grade 2 diarrhea at least but was very reluctant to use Imodium when he has just started using it today.  He appears to be in no acute distress although he does look tired.  We have discussed that we will follow-up with him in 2 weeks in the clinic and will make decisions if we would like to proceed with any further chemotherapy or if he would like to proceed with radiation alone and monitor.  This seemed very reasonable to him.  If he would like to attempt more treatment, I can try single agent nivolumab without the 5-FU although the microsatellite stable tumor may not respond to this approach.  Given his age and worsening performance status, he understands that standard of care treatment options are not feasible for him. He had a CBC and CMP drawn today, we do not have the hemoglobin  back however I have discussed that he may need transfusion since he has blood loss anemia and has needed transfusion in the past.  Have spoken to the nurse who has been taking care of him at [facility] who suggested that the patient will be transferred to the nearest hospital for blood transfusion if indicated. Diarrhea, grade 2 at the minimum, encouraged to use Imodium as needed for the management of diarrhea if he responds well.  At this time he appears to have responded very well to the Imodium dose. He is in agreement  I have sent an in basket message to our scheduling team to make a follow-up with me in 2 weeks for further discussion.  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  No other pathogenic or uncertain variants detected in [facility] Multi-Cancer +RNA Panel.  The report date is October 18, 2020.    The Multi-Cancer + RNA Panel offered by [facility] includes  sequencing and/or deletion/duplication analysis of the following 84 genes:  AIP*, ALK, APC*, ATM*, AXIN2*, BAP1*, BARD1*, BLM*, BMPR1A*, BRCA1*, BRCA2*, BRIP1*, CASR, CDC73*, CDH1*, CDK4, CDKN1B*, CDKN1C*, CDKN2A, CEBPA, CHEK2*, CTNNA1*, DICER1*, DIS3L2*, EGFR, EPCAM, FH*, FLCN*, GATA2*, GPC3, GREM1, HOXB13, HRAS, KIT, MAX*, MEN1*, MET, MITF, MLH1*, MSH2*, MSH3*, MSH6*, MUTYH*, NBN*, NF1*, NF2*, NTHL1*, PALB2*, PDGFRA, PHOX2B, PMS2*, POLD1*, POLE*, POT1*, PRKAR1A*, PTCH1*, PTEN*, RAD50*, RAD51C*, RAD51D*,  RB1*, RECQL4, RET, RUNX1*, SDHA*, SDHAF2*, SDHB*, SDHC*, SDHD*, SMAD4*, SMARCA4*, SMARCB1*, SMARCE1*, STK11*, SUFU*, TERC, TERT, TMEM127*, Tp53*, TSC1*, TSC2*, VHL*, WRN*, and WT1.  RNA analysis is performed for * genes.    CURRENT THERAPY: 5-fu pump infusion and nivolumab every 2 weeks, starting October 11, 2018  INTERVAL HISTORY:  Alec Weiss returns for follow-up after cycle 2, he is here for video visit with his daughter.  He is at [facility] rehab. He tells me that he has not done well after the second cycle.  He felt very tired, had a couple falls because of balance issues and he does not want to go through this experience again.  He also had more diarrhea, at baseline he has about 8-10 bowel movements but in the past few days he had more than 10 bowel movements.  He was however very reluctant to try Imodium that we discussed during his last visit.  He just started taking them in the past 2 days and has noticed a tremendous difference.  Since his Imodium dose this morning he has not had any loose bowel movement.  No fevers or chills or nausea or vomiting.  He is appetite is not that great and he has lost some weight.  He would like to take a break and discussed about moving forward with treatment during his next visit.  All other systems were reviewed with the patient and are negative.  MEDICAL HISTORY:  Past Medical History:  Diagnosis Date   Actinic keratosis 04/05/2012   Insomnia, unspecified 06/08/2008   Lumbago 01/02/2003   Neoplasm of uncertain behavior of skin 04/05/2012   Pain in limb 02/05/2011   Retinal detachment 2000   left eye   Screening cholesterol level    Shortness of breath 09/07/2008   Vitamin D deficiency 08/04/2012    SURGICAL HISTORY: Past Surgical History:  Procedure Laterality Date   BIOPSY  08/23/2020   Procedure: BIOPSY;  Surgeon: Doran Stabler, MD;  Location: [facility];  Service: Gastroenterology;;   COLONOSCOPY WITH PROPOFOL N/A 08/23/2020   Procedure:  COLONOSCOPY WITH PROPOFOL;  Surgeon: Doran Stabler, MD;  Location: [facility];  Service: Gastroenterology;  Laterality: N/A;   ESOPHAGOGASTRODUODENOSCOPY (EGD) WITH PROPOFOL N/A 08/23/2020   Procedure: ESOPHAGOGASTRODUODENOSCOPY (EGD) WITH PROPOFOL;  Surgeon: Doran Stabler, MD;  Location: [facility];  Service: Gastroenterology;  Laterality: N/A;   EXTERNAL EAR SURGERY  05/2012   left ear    INGUINAL HERNIA REPAIR Right 01/21/2005   Dr. [name]   IR IMAGING GUIDED PORT INSERTION  10/09/2020   LID LESION EXCISION  2009   MOHS SURGERY Left 05/28/2012   ear lobe Dr. [name]   RETINAL DETACHMENT SURGERY Left 1997   [facility]   SUBMUCOSAL INJECTION  08/23/2020   Procedure: SUBMUCOSAL INJECTION;  Surgeon: Doran Stabler, MD;  Location: [facility];  Service: Gastroenterology;;    I have reviewed the social history and family history with the patient and they are unchanged from previous note.  ALLERGIES:  is allergic to benzalkonium chloride, maxitrol [neomycin-polymyxin-dexameth], and neosporin [neomycin-bacitracin  zn-polymyx].  MEDICATIONS:  Current Outpatient Medications  Medication Sig Dispense Refill   dexamethasone (DECADRON) 4 MG tablet Take 2 tablets (8 mg total) by mouth daily. Start the day after chemotherapy for 2 days. Take with food. 30 tablet 1   esomeprazole (NEXIUM) 40 MG capsule TAKE ONE CAPSULE BY MOUTH DAILY 30 capsule 3   iron polysaccharides (NU-IRON) 150 MG capsule Take 1 capsule (150 mg total) by mouth daily. 30 capsule 3   loperamide (IMODIUM A-D) 2 MG tablet Take 2 mg by mouth 3 (three) times daily as needed for diarrhea or loose stools.     ondansetron (ZOFRAN) 8 MG tablet Take 1 tablet (8 mg total) by mouth 2 (two) times daily as needed for refractory nausea / vomiting. Start on day 3 after chemotherapy. 30 tablet 1   prochlorperazine (COMPAZINE) 10 MG tablet Take 1 tablet (10 mg total) by mouth every 6 (six) hours as needed (Nausea or vomiting). 30  tablet 1   vitamin B-12 (CYANOCOBALAMIN) 1000 MCG tablet Take 1 Tablet Daily 100 tablet 1   Vitamin D, Ergocalciferol, (DRISDOL) 1.25 MG (50000 UNIT) CAPS capsule TAKE ONE CAPSULE ONCE A WEEK FOR VITAMIN D SUPPLEMENT. 4 capsule 1   No current facility-administered medications for this visit.    PHYSICAL EXAMINATION: ECOG PERFORMANCE STATUS: 2 - Symptomatic, <50% confined to bed  There were no vitals filed for this visit.  There were no vitals filed for this visit.  V/S and PE deferred, video visit.  LABORATORY DATA:  I have reviewed the data as listed CBC Latest Ref Rng & Units 10/23/2020 10/10/2020 10/09/2020  WBC 4.0 - 10.5 K/uL 4.6 6.8 7.6  Hemoglobin 13.0 - 17.0 g/dL 9.2(L) 9.9(L) 10.6(L)  Hematocrit 39.0 - 52.0 % 27.3(L) 31.2(L) 34.2(L)  Platelets 150 - 400 K/uL 281 302 357     CMP Latest Ref Rng & Units 10/23/2020 10/10/2020 10/03/2020  Glucose 70 - 99 mg/dL 106(H) 110(H) 112(H)  BUN 8 - 23 mg/dL _0 Creatinine 0.61 - 1.24 mg/dL 0.79 0.76 0.86  Sodium 135 - 145 mmol/L 126(L) 133(L) 132(L)  Potassium 3.5 - 5.1 mmol/L 4.4 4.4 4.5  Chloride 98 - 111 mmol/L 92(L) 100 100  CO2 22 - 32 mmol/L _1 Calcium 8.9 - 10.3 mg/dL 9.0 9.3 9.1  Total Protein 6.5 - 8.1 g/dL 6.2(L) 6.5 6.4(L)  Total Bilirubin 0.3 - 1.2 mg/dL 0.4 0.6 0.3  Alkaline Phos 38 - 126 U/L 79 79 74  AST 15 - 41 U/L _2 ALT 0 - 44 U/L _3 RADIOGRAPHIC STUDIES: I have personally reviewed the radiological images as listed and agreed with the findings in the report. No results found.    No orders of the defined types were placed in this encounter.  All questions were answered. The patient knows to call the clinic with any problems, questions or concerns. No barriers to learning was detected. I spent 30 minutes in the care of this patient including reviewing history, discussion about the plan of care, discussion with the nursing about his labs and possible need for blood transfusion, use of  Imodium and follow-up recommendations.    Benay Pike, MD 11/01/20

## 2020-11-05 ENCOUNTER — Encounter: Payer: Self-pay | Admitting: Hematology and Oncology

## 2020-11-06 ENCOUNTER — Telehealth: Payer: Self-pay | Admitting: Internal Medicine

## 2020-11-06 ENCOUNTER — Non-Acute Institutional Stay (SKILLED_NURSING_FACILITY): Payer: Medicare PPO | Admitting: Orthopedic Surgery

## 2020-11-06 ENCOUNTER — Telehealth: Payer: Self-pay | Admitting: Hematology and Oncology

## 2020-11-06 ENCOUNTER — Encounter: Payer: Self-pay | Admitting: Orthopedic Surgery

## 2020-11-06 DIAGNOSIS — R531 Weakness: Secondary | ICD-10-CM

## 2020-11-06 DIAGNOSIS — E871 Hypo-osmolality and hyponatremia: Secondary | ICD-10-CM

## 2020-11-06 DIAGNOSIS — R634 Abnormal weight loss: Secondary | ICD-10-CM | POA: Diagnosis not present

## 2020-11-06 DIAGNOSIS — C20 Malignant neoplasm of rectum: Secondary | ICD-10-CM

## 2020-11-06 DIAGNOSIS — R197 Diarrhea, unspecified: Secondary | ICD-10-CM | POA: Diagnosis not present

## 2020-11-06 DIAGNOSIS — R413 Other amnesia: Secondary | ICD-10-CM

## 2020-11-06 DIAGNOSIS — G4701 Insomnia due to medical condition: Secondary | ICD-10-CM

## 2020-11-06 MED ORDER — MELATONIN 3 MG PO CAPS: 3.0000 mg | ORAL_CAPSULE | Freq: Every day | ORAL | 0 refills | Status: DC

## 2020-11-06 NOTE — Progress Notes (Addendum)
Location:   [facility] Room Number: [place] of Service:    Provider:  Windell Moulding, NP  [name], MD  Patient Care Team: [name], MD as PCP - General (Internal Medicine) [facility], [facility] [name], MD as Consulting Physician (Ophthalmology) Benay Pike, MD as Consulting Physician (Hematology and Oncology)  Extended Emergency Contact Information Primary Emergency Contact: [place], [place] [address] [address]te Litter of [place] Phone: [phone] Mobile Phone: [phone] Relation: Daughter  Code Status:  DNR Goals of care: Advanced Directive information Advanced Directives 11/06/2020  Does Patient Have a Medical Advance Directive? Yes  Type of Paramedic of [place];Living will;Out of facility DNR (pink MOST or yellow form)  Does patient want to make changes to medical advance directive? No - Patient declined  Copy of [place] in Chart? Yes - validated most recent copy scanned in chart (See row information)  Would patient like information on creating a medical advance directive? -  Pre-existing out of facility DNR order (yellow form or pink MOST form) Yellow form placed in chart (order not valid for inpatient use)     Chief Complaint  Patient presents with   Acute Visit    Patient presents with increased diarrhea and insomnia    HPI:  Pt is a 87 y.o. male seen today for an acute visit for increased diarrhea and insomnia.   He currently resides on the rehabilitation unit at [facility]. Past medical history includes: aortic stenosis, varicose veins, adenocarcinoma of rectum, actinic keratosis, anemia, joint pain, and insomnia.   Recently admitted to [facility] rehabilitation due to ongoing weakness, diarrhea and skin tears. He has received 2 cycles of chemotherapy with 5-flurouracil and Opdivo. 09/01 he had a video visit with oncology to discuss  further treatment. Due to ongoing weakness, weight loss and diarrhea, chemotherapy has been suspended. He plans to follow up with oncology in 2 weeks to discuss further treatment, radiation alone or continuing chemotherapy. Today, nursing reports ongoing diarrhea. Diarrhea occurring at all times of the day and interfering with sleep at night. It is unknown how many episodes of diarrhea he is having daily. He reports imodium is helping, but forgets to ask for medication. Nursing staff also reports memory issues, recent MMSE 22/30. He denies abdominal pain, blood in stool. Recent hemoglobin 9.2 10/23/2020. He received iron and blood transfusion 10/25/2020. Labs scheduled to be drawn 11/08/2020.   No recent falls or injuries. He is noted to have multiple skin tears on upper extremities, followed by wound nurse.      Past Medical History:  Diagnosis Date   Actinic keratosis 04/05/2012   Insomnia, unspecified 06/08/2008   Lumbago 01/02/2003   Neoplasm of uncertain behavior of skin 04/05/2012   Pain in limb 02/05/2011   Retinal detachment 2000   left eye   Screening cholesterol level    Shortness of breath 09/07/2008   Vitamin D deficiency 08/04/2012   Past Surgical History:  Procedure Laterality Date   BIOPSY  08/23/2020   Procedure: BIOPSY;  Surgeon: Doran Stabler, MD;  Location: [facility];  Service: Gastroenterology;;   COLONOSCOPY WITH PROPOFOL N/A 08/23/2020   Procedure: COLONOSCOPY WITH PROPOFOL;  Surgeon: Doran Stabler, MD;  Location: [facility];  Service: Gastroenterology;  Laterality: N/A;   ESOPHAGOGASTRODUODENOSCOPY (EGD) WITH PROPOFOL N/A 08/23/2020   Procedure: ESOPHAGOGASTRODUODENOSCOPY (EGD) WITH PROPOFOL;  Surgeon: Doran Stabler, MD;  Location: [facility];  Service: Gastroenterology;  Laterality: N/A;   EXTERNAL EAR SURGERY  05/2012   left ear    INGUINAL HERNIA REPAIR Right 01/21/2005   Dr. [name]   IR IMAGING GUIDED PORT INSERTION  10/09/2020   LID LESION EXCISION   2009   MOHS SURGERY Left 05/28/2012   ear lobe Dr. [name]   RETINAL DETACHMENT SURGERY Left 1997   [facility]   SUBMUCOSAL INJECTION  08/23/2020   Procedure: SUBMUCOSAL INJECTION;  Surgeon: Doran Stabler, MD;  Location: [facility];  Service: Gastroenterology;;    Allergies  Allergen Reactions   Benzalkonium Chloride Rash    Very allergic per patient    Maxitrol [Neomycin-Polymyxin-Dexameth] Itching and Rash   Neosporin [Neomycin-Bacitracin Zn-Polymyx] Itching and Rash    Allergies as of 11/06/2020       Reactions   Benzalkonium Chloride Rash   Very allergic per patient    Maxitrol [neomycin-polymyxin-dexameth] Itching, Rash   Neosporin [neomycin-bacitracin Zn-polymyx] Itching, Rash        Medication List        Accurate as of November 06, 2020 11:49 AM. If you have any questions, ask your nurse or doctor.          dexamethasone 4 MG tablet Commonly known as: DECADRON Take 2 tablets (8 mg total) by mouth daily. Start the day after chemotherapy for 2 days. Take with food.   esomeprazole 40 MG capsule Commonly known as: NEXIUM TAKE ONE CAPSULE BY MOUTH DAILY   iron polysaccharides 150 MG capsule Commonly known as: Nu-Iron Take 1 capsule (150 mg total) by mouth daily.   loperamide 2 MG tablet Commonly known as: IMODIUM A-D Take 2 mg by mouth 3 (three) times daily as needed for diarrhea or loose stools.   ondansetron 8 MG tablet Commonly known as: Zofran Take 1 tablet (8 mg total) by mouth 2 (two) times daily as needed for refractory nausea / vomiting. Start on day 3 after chemotherapy.   prochlorperazine 10 MG tablet Commonly known as: COMPAZINE Take 1 tablet (10 mg total) by mouth every 6 (six) hours as needed (Nausea or vomiting).   Systane 0.4-0.3 % Soln Generic drug: Polyethyl Glycol-Propyl Glycol Apply 2 drops to eye 3 (three) times daily as needed.   vitamin B-12 1000 MCG tablet Commonly known as: CYANOCOBALAMIN Take 1 Tablet Daily    Vitamin D (Ergocalciferol) 1.25 MG (50000 UNIT) Caps capsule Commonly known as: DRISDOL TAKE ONE CAPSULE ONCE A WEEK FOR VITAMIN D SUPPLEMENT.        Review of Systems  Constitutional:  Negative for activity change, appetite change, chills, fatigue and fever.  Respiratory:  Negative for cough, shortness of breath and wheezing.   Cardiovascular:  Negative for chest pain and leg swelling.  Gastrointestinal:  Positive for abdominal distention and diarrhea. Negative for abdominal pain, anal bleeding, blood in stool, constipation, nausea and rectal pain.  Skin:        Multiple skin tears to upper arms.   Psychiatric/Behavioral:  Positive for confusion. Negative for agitation and dysphoric mood. The patient is not nervous/anxious.    Immunization History  Administered Date(s) Administered   Hep A / Hep B 07/21/2001, 08/18/2001   Hepatitis A, Ped/Adol-2 Dose 01/24/2002   IPV 08/18/2001   Influenza Whole 12/02/2011, 12/31/2012   Influenza, High Dose Seasonal PF 12/10/2017, 12/10/2018, 12/30/2019   Influenza,inj,Quad PF,6+ Mos 12/20/2013   Influenza-Unspecified 12/21/2014, 12/27/2015, 12/22/2016, 12/10/2018   Moderna Sars-Covid-2 Vaccination 03/25/2019, 04/12/2019, 01/17/2020   Pneumococcal  Conjugate-13 11/22/2014   Pneumococcal Polysaccharide-23 07/21/2001, 12/03/2016   Td 04/12/2009   Tdap 12/10/2017, 05/07/2020   Typhoid Inactivated 07/21/2001, 04/12/2009   Zoster, Live 02/01/2011   Pertinent  Health Maintenance Due  Topic Date Due   INFLUENZA VACCINE  10/01/2020   PNA vac Low Risk Adult  Completed   Fall Risk  09/17/2020 02/08/2020 01/04/2020 12/21/2019 08/03/2019  Falls in the past year? 1 0 0 0 0  Number falls in past yr: 0 0 0 0 0  Comment - - - - -  Injury with Fall? 1 0 0 0 0  Comment - - - - -  Follow up Falls evaluation completed - - - -   Functional Status Survey:    Vitals:   11/06/20 1142  BP: (!) 141/78  Pulse: 67  Resp: 16  Temp: 98.4 F (36.9 C)  SpO2:  97%  Weight: 138 lb 9.6 oz (62.9 kg)  Height: '5\' 11"'$  (1.803 m)   Body mass index is 19.33 kg/m. Physical Exam Vitals reviewed.  Constitutional:      General: He is not in acute distress. HENT:     Head: Normocephalic.  Cardiovascular:     Rate and Rhythm: Normal rate and regular rhythm.     Pulses: Normal pulses.     Heart sounds: Normal heart sounds. No murmur heard. Pulmonary:     Effort: Pulmonary effort is normal. No respiratory distress.     Breath sounds: Normal breath sounds. No wheezing.  Abdominal:     General: Bowel sounds are normal. There is distension.     Palpations: There is no mass.     Tenderness: There is no abdominal tenderness. There is no guarding.     Hernia: No hernia is present.  Skin:    General: Skin is warm and dry.     Capillary Refill: Capillary refill takes less than 2 seconds.     Comments: 3 skin tears to left upper arm, CDI, covered with Allevyn dressing. 1 skin tear noted to right upper arm, CDI, covered with Allevyn dressing.   Neurological:     General: No focal deficit present.     Mental Status: He is alert. Mental status is at baseline.     Motor: Weakness present.     Gait: Gait abnormal.     Comments: Walker  Psychiatric:        Mood and Affect: Mood normal.        Behavior: Behavior normal.        Cognition and Memory: Memory is impaired.    Labs reviewed: Recent Labs    10/03/20 1207 10/10/20 0943 10/23/20 0858  NA 132* 133* 126*  K 4.5 4.4 4.4  CL 100 100 92*  CO2 '25 25 26  '$ GLUCOSE 112* 110* 106*  BUN '13 15 13  '$ CREATININE 0.86 0.76 0.79  CALCIUM 9.1 9.3 9.0   Recent Labs    10/03/20 1207 10/10/20 0943 10/23/20 0858  AST '15 16 21  '$ ALT '12 12 17  '$ ALKPHOS 74 79 79  BILITOT 0.3 0.6 0.4  PROT 6.4* 6.5 6.2*  ALBUMIN 3.3* 3.3* 3.3*   Recent Labs    10/03/20 1207 10/09/20 1330 10/10/20 0943 10/23/20 0858  WBC 7.3 7.6 6.8 4.6  NEUTROABS 5.5  --  5.0 3.2  HGB 7.3* 10.6* 9.9* 9.2*  HCT 23.2* 34.2* 31.2* 27.3*   MCV 78.4* 84.0 83.0 80.8  PLT 354 357 302 281   Lab Results  Component Value Date  TSH 1.495 10/23/2020   Lab Results  Component Value Date   HGBA1C 5.9 03/09/2012   Lab Results  Component Value Date   CHOL 189 12/16/2018   HDL 69 12/16/2018   LDLCALC 111 12/16/2018   TRIG 45 12/16/2018    Significant Diagnostic Results in last 30 days:  IR IMAGING GUIDED PORT INSERTION  Result Date: 10/09/2020 CLINICAL DATA:  RECTAL ADENOCARCINOMA EXAM: RIGHT INTERNAL JUGULAR SINGLE LUMEN POWER PORT CATHETER INSERTION Date:  10/09/2020 10/09/2020 3:17 pm Radiologist:  Jerilynn Mages. Daryll Brod, MD Guidance:  Ultrasound and fluoroscopic MEDICATIONS: 1% lidocaine local ANESTHESIA/SEDATION: Versed 1.5 mg IV; Fentanyl 100 mcg IV; Moderate Sedation Time:  26 minutes The patient was continuously monitored during the procedure by the interventional radiology nurse under my direct supervision. FLUOROSCOPY TIME:  0 minutes, 42 seconds (3 mGy) COMPLICATIONS: None immediate. CONTRAST:  None. PROCEDURE: Informed consent was obtained from the patient following explanation of the procedure, risks, benefits and alternatives. The patient understands, agrees and consents for the procedure. All questions were addressed. A time out was performed. Maximal barrier sterile technique utilized including caps, mask, sterile gowns, sterile gloves, large sterile drape, hand hygiene, and 2% chlorhexidine scrub. Under sterile conditions and local anesthesia, right internal jugular micropuncture venous access was performed. Access was performed with ultrasound. Images were obtained for documentation of the patent right internal jugular vein. A guide wire was inserted followed by a transitional dilator. This allowed insertion of a guide wire and catheter into the IVC. Measurements were obtained from the SVC / RA junction back to the right IJ venotomy site. In the right infraclavicular chest, a subcutaneous pocket was created over the second anterior  rib. This was done under sterile conditions and local anesthesia. 1% lidocaine with epinephrine was utilized for this. A 2.5 cm incision was made in the skin. Blunt dissection was performed to create a subcutaneous pocket over the right pectoralis major muscle. The pocket was flushed with saline vigorously. There was adequate hemostasis. The port catheter was assembled and checked for leakage. The port catheter was secured in the pocket with two retention sutures. The tubing was tunneled subcutaneously to the right venotomy site and inserted into the SVC/RA junction through a valved peel-away sheath. Position was confirmed with fluoroscopy. Images were obtained for documentation. The patient tolerated the procedure well. No immediate complications. Incisions were closed in a two layer fashion with 4 - 0 Vicryl suture. Dermabond was applied to the skin. The port catheter was accessed, blood was aspirated followed by saline and heparin flushes. Needle was removed. A dry sterile dressing was applied. IMPRESSION: Ultrasound and fluoroscopically guided right internal jugular single lumen power port catheter insertion. Tip in the SVC/RA junction. Catheter ready for use. Electronically Signed   By: Jerilynn Mages.  Shick M.D.   On: 10/09/2020 15:32    Assessment/Plan 1. Adenocarcinoma of rectum ([place]) - followed by oncology - He has received 2 cycles of chemotherapy with 5-flurouracil and Opdivo - treatment held due to weakness - he plans to see oncology in 2 weeks to discuss treatment options - cbc/diff to be drawn 11/08/2020  2. Weight loss - ongoing, related to diarrhea - nutrition consult done - cont Boost daily  3. Diarrhea, unspecified type - ongoing, related to rectal cancer/chemo - increase imodium 2 mg po bid prn - nursing to count episodes of diarrhea and report daily number in 3 days  4. Weakness - cont PT/OT  5. Hyponatremia - Na 126 10/23/2020 - bmp to be drawn 11/08/2020  6. Insomnia due to  medical condition - diarrhea disrupting sleep - start imodium 2 mg po qhs  - melatonin 3 mg po qhs  7. Impaired memory - recent MMSE 22/30 - CT head 06/24/2020- mild white matter changes noted   Family/ staff Communication: plan discussed with patient and nurse  Labs/tests ordered:  cbc/diff, bmp to be drawn 11/08/2020

## 2020-11-06 NOTE — Addendum Note (Signed)
Addended byWindell Moulding E on: 11/06/2020 07:49 PM   Modules accepted: Orders

## 2020-11-06 NOTE — Telephone Encounter (Signed)
Cancelled appts and made f/u next week per sch msg. Called and spoke with patients daughter. Confirmed appts

## 2020-11-07 ENCOUNTER — Inpatient Hospital Stay: Payer: Medicare PPO

## 2020-11-07 ENCOUNTER — Inpatient Hospital Stay: Payer: Medicare PPO | Admitting: Hematology and Oncology

## 2020-11-08 ENCOUNTER — Non-Acute Institutional Stay (SKILLED_NURSING_FACILITY): Payer: Medicare PPO | Admitting: Internal Medicine

## 2020-11-08 ENCOUNTER — Encounter: Payer: Self-pay | Admitting: Internal Medicine

## 2020-11-08 DIAGNOSIS — E871 Hypo-osmolality and hyponatremia: Secondary | ICD-10-CM

## 2020-11-08 DIAGNOSIS — R634 Abnormal weight loss: Secondary | ICD-10-CM

## 2020-11-08 DIAGNOSIS — R531 Weakness: Secondary | ICD-10-CM | POA: Diagnosis not present

## 2020-11-08 DIAGNOSIS — C20 Malignant neoplasm of rectum: Secondary | ICD-10-CM

## 2020-11-08 DIAGNOSIS — R413 Other amnesia: Secondary | ICD-10-CM

## 2020-11-08 DIAGNOSIS — T148XXA Other injury of unspecified body region, initial encounter: Secondary | ICD-10-CM

## 2020-11-08 DIAGNOSIS — R197 Diarrhea, unspecified: Secondary | ICD-10-CM | POA: Diagnosis not present

## 2020-11-08 DIAGNOSIS — D500 Iron deficiency anemia secondary to blood loss (chronic): Secondary | ICD-10-CM

## 2020-11-08 DIAGNOSIS — R296 Repeated falls: Secondary | ICD-10-CM

## 2020-11-08 LAB — CBC AND DIFFERENTIAL
HCT: 28 — AB (ref 41–53)
Hemoglobin: 9.6 — AB (ref 13.5–17.5)
Platelets: 185 (ref 150–399)
WBC: 3.8

## 2020-11-08 LAB — BASIC METABOLIC PANEL
BUN: 15 (ref 4–21)
CO2: 26 — AB (ref 13–22)
Chloride: 93 — AB (ref 99–108)
Creatinine: 0.7 (ref 0.6–1.3)
Glucose: 109
Potassium: 4.7 (ref 3.4–5.3)
Sodium: 128 — AB (ref 137–147)

## 2020-11-08 LAB — CBC: RBC: 3.45 — AB (ref 3.87–5.11)

## 2020-11-08 LAB — COMPREHENSIVE METABOLIC PANEL: Calcium: 9.1 (ref 8.7–10.7)

## 2020-11-08 NOTE — Progress Notes (Signed)
Provider:  Veleta Miners MD Location:   [facility] Room Number: [id number] Place of Service:  SNF ([phone])  PCP: [name], MD Patient Care Team: [name], MD as PCP - General (Internal Medicine) [facility] [name], MD as Consulting Physician (Ophthalmology) Benay Pike, MD as Consulting Physician (Hematology and Oncology)  Extended Emergency Contact Information Primary Emergency Contact: [place], [place] [address] Johnnette Litter of [place] Phone: [phone] Mobile Phone: [phone] Relation: Daughter  Code Status: DNR Goals of Care: Advanced Directive information Advanced Directives 11/08/2020  Does Patient Have a Medical Advance Directive? Yes  Type of Paramedic of [place];Living will;Out of facility DNR (pink MOST or yellow form)  Does patient want to make changes to medical advance directive? No - Patient declined  Copy of [place] in Chart? Yes - validated most recent copy scanned in chart (See row information)  Would patient like information on creating a medical advance directive? -  Pre-existing out of facility DNR order (yellow form or pink MOST form) Yellow form placed in chart (order not valid for inpatient use)      Chief Complaint  Patient presents with   New Admit To SNF    Admission to SNF    HPI: Patient is a 87 y.o. male seen today for admission to SNF for Weakness and Recurrent falls at home  Patient with recent diagnosis of adenocarcinoma of rectum . Patient received 2 rounds of 5-FU and Opdivo.  He states that he got very weak after the second dose. He was so weak that he could not take care of himself in his apartment and is now moved to SNF. Patient continues to have severe diarrhea.  According to the nurses he is going to the bathroom every hour.  Has not responded to as needed Imodium But otherwise he feels  better.  Able to get up with mild assist and his walker.  Able to walk without any dizziness.  Is eating well and not having any abdominal pain  no blood in stool. No fever no nausea vomiting. Patient did state that he does not think he can go through another round of chemo.  He does have appointment with hematology oncology. He is also on doxycycline for a wound infection in his left.  He had a laceration when he fell. He is also on iron infusion per hematology for his anemia Nurses have also noticed cognitive impairment.  MMSE was 22 out of 30 Has lost almost 15 pounds since my last visit few months ago  Past Medical History:  Diagnosis Date   Actinic keratosis 04/05/2012   Insomnia, unspecified 06/08/2008   Lumbago 01/02/2003   Neoplasm of uncertain behavior of skin 04/05/2012   Pain in limb 02/05/2011   Retinal detachment 2000   left eye   Screening cholesterol level    Shortness of breath 09/07/2008   Vitamin D deficiency 08/04/2012   Past Surgical History:  Procedure Laterality Date   BIOPSY  08/23/2020   Procedure: BIOPSY;  Surgeon: Doran Stabler, MD;  Location: [facility];  Service: Gastroenterology;;   COLONOSCOPY WITH PROPOFOL N/A 08/23/2020   Procedure: COLONOSCOPY WITH PROPOFOL;  Surgeon: Doran Stabler, MD;  Location: [facility];  Service: Gastroenterology;  Laterality: N/A;   ESOPHAGOGASTRODUODENOSCOPY (EGD) WITH PROPOFOL N/A 08/23/2020   Procedure: ESOPHAGOGASTRODUODENOSCOPY (EGD) WITH PROPOFOL;  Surgeon: [name],  MD;  Location: [facility];  Service: Gastroenterology;  Laterality: N/A;   EXTERNAL EAR SURGERY  05/2012   left ear    INGUINAL HERNIA REPAIR Right 01/21/2005   Dr. [name]   IR IMAGING GUIDED PORT INSERTION  10/09/2020   LID LESION EXCISION  2009   MOHS SURGERY Left 05/28/2012   ear lobe Dr. [name]   RETINAL DETACHMENT SURGERY Left 1997   [facility]   SUBMUCOSAL INJECTION  08/23/2020   Procedure: SUBMUCOSAL INJECTION;  Surgeon: Doran Stabler, MD;  Location: [facility];  Service: Gastroenterology;;    reports that he has never smoked. He has never used smokeless tobacco. He reports current alcohol use of about 1.0 standard drink per week. He reports that he does not use drugs. Social History   Socioeconomic History   Marital status: Widowed    Spouse name: Not on file   Number of children: Not on file   Years of education: Not on file   Highest education level: Not on file  Occupational History   Occupation: retired Hydrologist professor  Tobacco Use   Smoking status: Never   Smokeless tobacco: Never  Vaping Use   Vaping Use: Never used  Substance and Sexual Activity   Alcohol use: Yes    Alcohol/week: 1.0 standard drink    Types: 1 Glasses of wine per week    Comment: at dinner every night.   Drug use: No   Sexual activity: Never  Other Topics Concern   Not on file  Social History Narrative   Patient is  Married [name]) since 1958--widowed a few years ago (updating 2021). Retired professor, Hydrologist. Lives in apartment, Independent Living section at [facility] since 02/2012.       No Smoking history, drinks moderate amount of alcohol    Patient has Advanced planning documents: DNR   Exercise walking - daily ~1 1/2 - 2 miles (~ 1hr daily) as well as [employer] Exercise classes.   Social Determinants of Health   Financial Resource Strain: Not on file  Food Insecurity: Not on file  Transportation Needs: Not on file  Physical Activity: Not on file  Stress: Not on file  Social Connections: Not on file  Intimate Partner Violence: Not on file    Functional Status Survey:    Family History  Problem Relation Age of Onset   Breast cancer Mother        dx after 32    Health Maintenance  Topic Date Due   Zoster Vaccines- Shingrix (1 of 2) Never done   COVID-19 Vaccine (4 - Booster) 04/10/2020   INFLUENZA VACCINE  10/01/2020   TETANUS/TDAP  05/08/2030   PNA vac Low Risk Adult  Completed    HPV VACCINES  Aged Out    Allergies  Allergen Reactions   Benzalkonium Chloride Rash    Very allergic per patient    Maxitrol [Neomycin-Polymyxin-Dexameth] Itching and Rash   Neosporin [Neomycin-Bacitracin Zn-Polymyx] Itching and Rash    Allergies as of 11/08/2020       Reactions   Benzalkonium Chloride Rash   Very allergic per patient    Maxitrol [neomycin-polymyxin-dexameth] Itching, Rash   Neosporin [neomycin-bacitracin Zn-polymyx] Itching, Rash        Medication List        Accurate as of November 08, 2020  9:02 AM. If you have any questions, ask your nurse or doctor.          dexamethasone 4 MG tablet Commonly known  as: DECADRON Take 2 tablets (8 mg total) by mouth daily. Start the day after chemotherapy for 2 days. Take with food.   doxycycline 100 MG tablet Commonly known as: VIBRA-TABS Take 100 mg by mouth 2 (two) times daily.   esomeprazole 40 MG capsule Commonly known as: NEXIUM TAKE ONE CAPSULE BY MOUTH DAILY   iron polysaccharides 150 MG capsule Commonly known as: Nu-Iron Take 1 capsule (150 mg total) by mouth daily.   loperamide 2 MG tablet Commonly known as: IMODIUM A-D Take 2 mg by mouth 2 (two) times daily as needed for diarrhea or loose stools.   loperamide 2 MG tablet Commonly known as: IMODIUM A-D Take 2 mg by mouth at bedtime.   Melatonin 3 MG Caps Take 1 capsule (3 mg total) by mouth at bedtime.   ondansetron 8 MG tablet Commonly known as: Zofran Take 1 tablet (8 mg total) by mouth 2 (two) times daily as needed for refractory nausea / vomiting. Start on day 3 after chemotherapy.   prochlorperazine 10 MG tablet Commonly known as: COMPAZINE Take 1 tablet (10 mg total) by mouth every 6 (six) hours as needed (Nausea or vomiting).   Systane 0.4-0.3 % Soln Generic drug: Polyethyl Glycol-Propyl Glycol Apply 2 drops to eye 3 (three) times daily as needed.   vitamin B-12 1000 MCG tablet Commonly known as: CYANOCOBALAMIN Take 1 Tablet  Daily   Vitamin D (Ergocalciferol) 1.25 MG (50000 UNIT) Caps capsule Commonly known as: DRISDOL TAKE ONE CAPSULE ONCE A WEEK FOR VITAMIN D SUPPLEMENT.        Review of Systems  Constitutional:  Positive for activity change and appetite change.  HENT: Negative.    Respiratory: Negative.    Cardiovascular: Negative.   Gastrointestinal:  Positive for abdominal distention and diarrhea.  Genitourinary: Negative.   Musculoskeletal:  Positive for gait problem.  Skin:  Positive for wound.  Neurological:  Positive for weakness.  Psychiatric/Behavioral:  Positive for confusion.    Vitals:   11/08/20 0852  BP: 125/64  Pulse: 77  Resp: 16  Temp: 98 F (36.7 C)  SpO2: 97%  Weight: 138 lb 9.6 oz (62.9 kg)  Height: '5\' 11"'$  (1.803 m)   Body mass index is 19.33 kg/m. Physical Exam Constitutional: Oriented to person, place, and time. Well-developed and well-nourished.  HENT:  Head: Normocephalic.  Mouth/Throat: Oropharynx is clear and moist.  Eyes: Pupils are equal, round, and reactive to light.  Neck: Neck supple.  Cardiovascular: Normal rate and normal heart sounds.  Murmur Present Pulmonary/Chest: Effort normal and breath sounds normal. No respiratory distress. No wheezes. Has no rales.  Abdominal: Soft.Mild Distension Bowel sounds are normal. . There is no tenderness. There is no rebound.  Musculoskeletal: No edema.  Lymphadenopathy: none Neurological: Alert and oriented to person, place, and time. Walks with his walker and assist Skin: Skin is warm and dry. Has multiple abrasion and bruises due to fall One in Upper Left side has Redness and Mild Discharge Psychiatric: Normal mood and affect. Behavior is normal. Thought content normal.   Labs reviewed: Basic Metabolic Panel: Recent Labs    10/03/20 1207 10/10/20 0943 10/23/20 0858 11/01/20 0000  NA 132* 133* 126* 131*  K 4.5 4.4 4.4 4.3  CL 100 100 92* 96*  CO2 '25 25 26 '$ 27*  GLUCOSE 112* 110* 106*  --   BUN '13 15 13  14  '$ CREATININE 0.86 0.76 0.79 0.8  CALCIUM 9.1 9.3 9.0 8.8   Liver Function Tests: Recent Labs  10/03/20 1207 10/10/20 0943 10/23/20 0858 11/01/20 0000  AST '15 16 21 17  '$ ALT '12 12 17 21  '$ ALKPHOS 74 79 79 89  BILITOT 0.3 0.6 0.4  --   PROT 6.4* 6.5 6.2*  --   ALBUMIN 3.3* 3.3* 3.3* 3.6   No results for input(s): LIPASE, AMYLASE in the last 8760 hours. No results for input(s): AMMONIA in the last 8760 hours. CBC: Recent Labs    10/03/20 1207 10/09/20 1330 10/10/20 0943 10/23/20 0858 11/01/20 0000  WBC 7.3 7.6 6.8 4.6 6.5  NEUTROABS 5.5  --  5.0 3.2  --   HGB 7.3* 10.6* 9.9* 9.2* 10.1*  HCT 23.2* 34.2* 31.2* 27.3* 30*  MCV 78.4* 84.0 83.0 80.8  --   PLT 354 357 302 281 369   Cardiac Enzymes: No results for input(s): CKTOTAL, CKMB, CKMBINDEX, TROPONINI in the last 8760 hours. BNP: Invalid input(s): POCBNP Lab Results  Component Value Date   HGBA1C 5.9 03/09/2012   Lab Results  Component Value Date   TSH 1.495 10/23/2020   Lab Results  Component Value Date   [id number] 05/17/2020   No results found for: FOLATE Lab Results  Component Value Date   IRON 16 09/11/2020   TIBC 369 09/11/2020   FERRITIN 7.04 09/11/2020    Imaging and Procedures obtained prior to SNF admission: IR IMAGING GUIDED PORT INSERTION  Result Date: 10/09/2020 CLINICAL DATA:  RECTAL ADENOCARCINOMA EXAM: RIGHT INTERNAL JUGULAR SINGLE LUMEN POWER PORT CATHETER INSERTION Date:  10/09/2020 10/09/2020 3:17 pm Radiologist:  Jerilynn Mages. Daryll Brod, MD Guidance:  Ultrasound and fluoroscopic MEDICATIONS: 1% lidocaine local ANESTHESIA/SEDATION: Versed 1.5 mg IV; Fentanyl 100 mcg IV; Moderate Sedation Time:  26 minutes The patient was continuously monitored during the procedure by the interventional radiology nurse under my direct supervision. FLUOROSCOPY TIME:  0 minutes, 42 seconds (3 mGy) COMPLICATIONS: None immediate. CONTRAST:  None. PROCEDURE: Informed consent was obtained from the patient following  explanation of the procedure, risks, benefits and alternatives. The patient understands, agrees and consents for the procedure. All questions were addressed. A time out was performed. Maximal barrier sterile technique utilized including caps, mask, sterile gowns, sterile gloves, large sterile drape, hand hygiene, and 2% chlorhexidine scrub. Under sterile conditions and local anesthesia, right internal jugular micropuncture venous access was performed. Access was performed with ultrasound. Images were obtained for documentation of the patent right internal jugular vein. A guide wire was inserted followed by a transitional dilator. This allowed insertion of a guide wire and catheter into the IVC. Measurements were obtained from the SVC / RA junction back to the right IJ venotomy site. In the right infraclavicular chest, a subcutaneous pocket was created over the second anterior rib. This was done under sterile conditions and local anesthesia. 1% lidocaine with epinephrine was utilized for this. A 2.5 cm incision was made in the skin. Blunt dissection was performed to create a subcutaneous pocket over the right pectoralis major muscle. The pocket was flushed with saline vigorously. There was adequate hemostasis. The port catheter was assembled and checked for leakage. The port catheter was secured in the pocket with two retention sutures. The tubing was tunneled subcutaneously to the right venotomy site and inserted into the SVC/RA junction through a valved peel-away sheath. Position was confirmed with fluoroscopy. Images were obtained for documentation. The patient tolerated the procedure well. No immediate complications. Incisions were closed in a two layer fashion with 4 - 0 Vicryl suture. Dermabond was applied to the skin. The port  catheter was accessed, blood was aspirated followed by saline and heparin flushes. Needle was removed. A dry sterile dressing was applied. IMPRESSION: Ultrasound and fluoroscopically  guided right internal jugular single lumen power port catheter insertion. Tip in the SVC/RA junction. Catheter ready for use. Electronically Signed   By: Jerilynn Mages.  Shick M.D.   On: 10/09/2020 15:32    Assessment/Plan Diarrhea, unspecified type Check Stool for C Diff anfd GI pathogens Try Questran 4 gms BID  Continue Imodium q4 PRN Also Do Abdominal KUB to rule our Constipation or Obstruction  Adenocarcinoma of rectum ([place]) Has follow up with Oncology  Did not  tolerate Chemo  He is telling me he is not interested in another round of Chemo Possible radiation   Weakness Continue to work with Therapy  Weight loss Appetite seems better now Working with Dietary Hyponatremia Sodium today was 128 Liberalize Diet for now Repeat BMP in 1 week Frequent falls Due to Weakness Continue to work here  [employer] Impairment MMSE 22/30   Iron deficiency anemia due to chronic blood loss On Infusion per hematology PRN HGB 9.4 today Multiple skin tears One of it is infected On Doxycyline for 5 days Looks Better Dry dressing for now   Family/ staff Communication:   Labs/tests ordered: BMP in 1 week to follow Sodium

## 2020-11-09 ENCOUNTER — Encounter: Payer: Self-pay | Admitting: Internal Medicine

## 2020-11-09 ENCOUNTER — Inpatient Hospital Stay: Payer: Medicare PPO

## 2020-11-10 ENCOUNTER — Encounter: Payer: Self-pay | Admitting: Internal Medicine

## 2020-11-12 ENCOUNTER — Encounter: Payer: Self-pay | Admitting: Internal Medicine

## 2020-11-12 ENCOUNTER — Non-Acute Institutional Stay (SKILLED_NURSING_FACILITY): Payer: Medicare PPO | Admitting: Internal Medicine

## 2020-11-12 ENCOUNTER — Other Ambulatory Visit: Payer: Medicare PPO

## 2020-11-12 ENCOUNTER — Encounter: Payer: Self-pay | Admitting: Hematology and Oncology

## 2020-11-12 ENCOUNTER — Ambulatory Visit: Payer: Medicare PPO | Admitting: Hematology and Oncology

## 2020-11-12 ENCOUNTER — Ambulatory Visit: Payer: Medicare PPO

## 2020-11-12 DIAGNOSIS — R4189 Other symptoms and signs involving cognitive functions and awareness: Secondary | ICD-10-CM

## 2020-11-12 DIAGNOSIS — C20 Malignant neoplasm of rectum: Secondary | ICD-10-CM | POA: Diagnosis not present

## 2020-11-12 DIAGNOSIS — E871 Hypo-osmolality and hyponatremia: Secondary | ICD-10-CM | POA: Diagnosis not present

## 2020-11-12 DIAGNOSIS — R197 Diarrhea, unspecified: Secondary | ICD-10-CM | POA: Diagnosis not present

## 2020-11-12 DIAGNOSIS — R531 Weakness: Secondary | ICD-10-CM | POA: Diagnosis not present

## 2020-11-12 DIAGNOSIS — T148XXA Other injury of unspecified body region, initial encounter: Secondary | ICD-10-CM

## 2020-11-12 DIAGNOSIS — D649 Anemia, unspecified: Secondary | ICD-10-CM

## 2020-11-12 NOTE — Progress Notes (Signed)
Location:   [place] Room Number: 156 Place of Service:  SNF [phone]) Provider:  Veleta Miners MD  [name], MD  Patient Care Team: [name], MD as PCP - General (Internal Medicine) Community, [facility] [name], MD as Consulting Physician (Ophthalmology) Benay Pike, MD as Consulting Physician (Hematology and Oncology)  Extended Emergency Contact Information Primary Emergency Contact: [place] [place], [place] [address] Johnnette Litter of [place] Phone: [phone] Mobile Phone: [phone] Relation: Daughter  Code Status:  DNR Goals of care: Advanced Directive information Advanced Directives 11/12/2020  Does Patient Have a Medical Advance Directive? Yes  Type of Paramedic of [place];Living will;Out of facility DNR (pink MOST or yellow form)  Does patient want to make changes to medical advance directive? No - Patient declined  Copy of [place] in Chart? Yes - validated most recent copy scanned in chart (See row information)  Would patient like information on creating a medical advance directive? -  Pre-existing out of facility DNR order (yellow form or pink MOST form) Yellow form placed in chart (order not valid for inpatient use)     Chief Complaint  Patient presents with   Acute Visit    HPI:  Pt is a 87 y.o. male seen today for an acute visit for Diarrhea Rectal urgency and possible Transition to Hospice  Patient with recent diagnosis of adenocarcinoma of rectum . Patient received 2 rounds of 5-FU and Opdivo.  He states that he got very weak after the second dose. He was so weak that he could not take care of himself in his apartment and is now moved to SNF.  His main Complain here was Diarrhea / Going to the Bathroom every hour Was started on Questran on Fri with Imodium C Diff was negative Gi Pathogen negative Abdominal Xray showed  Mild Constipation But Questran has helped him some and making fewer trips to bathroom  Today his Abdomen seems more distended But he has no discomfort. No Pain No Nausea or vomiting Eating well. Continues to  also have Cognitive issues With taking care of his ADLS Also feels weak. Breathing seems more harder ? Due to Distended Abdomen. POX 98% on RA  Also discussed about his Further plan. He is definite he does not want Chemo another round. He did not seem very keen about Radiation either    Past Medical History:  Diagnosis Date   Actinic keratosis 04/05/2012   Insomnia, unspecified 06/08/2008   Lumbago 01/02/2003   Neoplasm of uncertain behavior of skin 04/05/2012   Pain in limb 02/05/2011   Retinal detachment 2000   left eye   Screening cholesterol level    Shortness of breath 09/07/2008   Vitamin D deficiency 08/04/2012   Past Surgical History:  Procedure Laterality Date   BIOPSY  08/23/2020   Procedure: BIOPSY;  Surgeon: Doran Stabler, MD;  Location: [facility];  Service: Gastroenterology;;   COLONOSCOPY WITH PROPOFOL N/A 08/23/2020   Procedure: COLONOSCOPY WITH PROPOFOL;  Surgeon: Doran Stabler, MD;  Location: [facility];  Service: Gastroenterology;  Laterality: N/A;   ESOPHAGOGASTRODUODENOSCOPY (EGD) WITH PROPOFOL N/A 08/23/2020   Procedure: ESOPHAGOGASTRODUODENOSCOPY (EGD) WITH PROPOFOL;  Surgeon: Doran Stabler, MD;  Location: [facility];  Service: Gastroenterology;  Laterality: N/A;   EXTERNAL EAR SURGERY  05/2012   left ear    INGUINAL HERNIA REPAIR Right 01/21/2005  Dr. [name]   IR IMAGING GUIDED PORT INSERTION  10/09/2020   LID LESION EXCISION  2009   MOHS SURGERY Left 05/28/2012   ear lobe Dr. [name]   RETINAL DETACHMENT SURGERY Left 1997   [facility]   SUBMUCOSAL INJECTION  08/23/2020   Procedure: SUBMUCOSAL INJECTION;  Surgeon: Doran Stabler, MD;  Location: [facility];  Service: Gastroenterology;;    Allergies  Allergen Reactions    Benzalkonium Chloride Rash    Very allergic per patient    Maxitrol [Neomycin-Polymyxin-Dexameth] Itching and Rash   Neosporin [Neomycin-Bacitracin Zn-Polymyx] Itching and Rash    Allergies as of 11/12/2020       Reactions   Benzalkonium Chloride Rash   Very allergic per patient    Maxitrol [neomycin-polymyxin-dexameth] Itching, Rash   Neosporin [neomycin-bacitracin Zn-polymyx] Itching, Rash        Medication List        Accurate as of November 12, 2020 11:12 AM. If you have any questions, ask your nurse or doctor.          cholestyramine 4 GM/DOSE powder Commonly known as: QUESTRAN Take by mouth 2 (two) times daily with a meal.   dexamethasone 4 MG tablet Commonly known as: DECADRON Take 2 tablets (8 mg total) by mouth daily. Start the day after chemotherapy for 2 days. Take with food.   doxycycline 100 MG tablet Commonly known as: VIBRA-TABS Take 100 mg by mouth 2 (two) times daily.   esomeprazole 40 MG capsule Commonly known as: NEXIUM TAKE ONE CAPSULE BY MOUTH DAILY   iron polysaccharides 150 MG capsule Commonly known as: Nu-Iron Take 1 capsule (150 mg total) by mouth daily.   loperamide 2 MG tablet Commonly known as: IMODIUM A-D Take 2 mg by mouth 2 (two) times daily as needed for diarrhea or loose stools.   loperamide 2 MG tablet Commonly known as: IMODIUM A-D Take 2 mg by mouth at bedtime.   Melatonin 3 MG Caps Take 1 capsule (3 mg total) by mouth at bedtime.   ondansetron 8 MG tablet Commonly known as: Zofran Take 1 tablet (8 mg total) by mouth 2 (two) times daily as needed for refractory nausea / vomiting. Start on day 3 after chemotherapy.   prochlorperazine 10 MG tablet Commonly known as: COMPAZINE Take 1 tablet (10 mg total) by mouth every 6 (six) hours as needed (Nausea or vomiting).   Systane 0.4-0.3 % Soln Generic drug: Polyethyl Glycol-Propyl Glycol Apply 2 drops to eye 3 (three) times daily as needed.   vitamin B-12 1000 MCG  tablet Commonly known as: CYANOCOBALAMIN Take 1 Tablet Daily   Vitamin D (Ergocalciferol) 1.25 MG (50000 UNIT) Caps capsule Commonly known as: DRISDOL TAKE ONE CAPSULE ONCE A WEEK FOR VITAMIN D SUPPLEMENT.        Review of Systems  Constitutional:  Positive for activity change.  HENT: Negative.    Respiratory: Negative.    Cardiovascular:  Positive for leg swelling.  Gastrointestinal:  Positive for abdominal distention and diarrhea.  Genitourinary: Negative.   Musculoskeletal: Negative.   Skin:  Positive for wound.  Neurological:  Positive for weakness.  Psychiatric/Behavioral:  Positive for confusion.    Immunization History  Administered Date(s) Administered   Hep A / Hep B 07/21/2001, 08/18/2001   Hepatitis A, Ped/Adol-2 Dose 01/24/2002   IPV 08/18/2001   Influenza Whole 12/02/2011, 12/31/2012   Influenza, High Dose Seasonal PF 12/10/2017, 12/10/2018, 12/30/2019   Influenza,inj,Quad PF,6+ Mos 12/20/2013   Influenza-Unspecified 12/21/2014, 12/27/2015, 12/22/2016, 12/10/2018  Moderna Sars-Covid-2 Vaccination 03/25/2019, 04/12/2019, 01/17/2020   Pneumococcal Conjugate-13 11/22/2014   Pneumococcal Polysaccharide-23 07/21/2001, 12/03/2016   Td 04/12/2009   Tdap 12/10/2017, 05/07/2020   Typhoid Inactivated 07/21/2001, 04/12/2009   Zoster, Live 02/01/2011   Pertinent  Health Maintenance Due  Topic Date Due   INFLUENZA VACCINE  10/01/2020   PNA vac Low Risk Adult  Completed   Fall Risk  09/17/2020 02/08/2020 01/04/2020 12/21/2019 08/03/2019  Falls in the past year? 1 0 0 0 0  Number falls in past yr: 0 0 0 0 0  Comment - - - - -  Injury with Fall? 1 0 0 0 0  Comment - - - - -  Follow up Falls evaluation completed - - - -   Functional Status Survey:    Vitals:   11/12/20 1100  BP: 132/71  Pulse: 71  Resp: 18  Temp: 97.8 F (36.6 C)  SpO2: 97%  Weight: 138 lb 9.6 oz (62.9 kg)  Height: '5\' 11"'$  (1.803 m)   Body mass index is 19.33 kg/m. Physical  Exam Constitutional: Oriented to person, place, and time. Well-developed and well-nourished.  HENT:  Head: Normocephalic.  Mouth/Throat: Oropharynx is clear and moist.  Eyes: Pupils are equal, round, and reactive to light.  Neck: Neck supple.  Cardiovascular: Normal rate and normal heart sounds.   murmur heard. Pulmonary/Chest: Effort normal and breath sounds normal. No respiratory distress. No wheezes he has no rales.  Abdominal: Soft. Distended Not tender BS present  Musculoskeletal: No edema. Chronic Venous changes Lymphadenopathy: none Neurological: Alert and oriented to person, place, and time.  Skin: Skin is warm and dry.  Psychiatric: Normal mood and affect. Behavior is normal. Thought content normal.   Labs reviewed: Recent Labs    10/03/20 1207 10/10/20 0943 10/23/20 0858 11/01/20 0000 11/08/20 0000  NA 132* 133* 126* 131* 128*  K 4.5 4.4 4.4 4.3 4.7  CL 100 100 92* 96* 93*  CO2 '25 25 26 '$ 27* 26*  GLUCOSE 112* 110* 106*  --   --   BUN '13 15 13 14 15  '$ CREATININE 0.86 0.76 0.79 0.8 0.7  CALCIUM 9.1 9.3 9.0 8.8 9.1   Recent Labs    10/03/20 1207 10/10/20 0943 10/23/20 0858 11/01/20 0000  AST '15 16 21 17  '$ ALT '12 12 17 21  '$ ALKPHOS 74 79 79 89  BILITOT 0.3 0.6 0.4  --   PROT 6.4* 6.5 6.2*  --   ALBUMIN 3.3* 3.3* 3.3* 3.6   Recent Labs    10/03/20 1207 10/09/20 1330 10/10/20 0943 10/23/20 0858 11/01/20 0000 11/08/20 0000  WBC 7.3 7.6 6.8 4.6 6.5 3.8  NEUTROABS 5.5  --  5.0 3.2  --   --   HGB 7.3* 10.6* 9.9* 9.2* 10.1* 9.6*  HCT 23.2* 34.2* 31.2* 27.3* 30* 28*  MCV 78.4* 84.0 83.0 80.8  --   --   PLT 354 357 302 281 369 185   Lab Results  Component Value Date   TSH 1.495 10/23/2020   Lab Results  Component Value Date   HGBA1C 5.9 03/09/2012   Lab Results  Component Value Date   CHOL 189 12/16/2018   HDL 69 12/16/2018   LDLCALC 111 12/16/2018   TRIG 45 12/16/2018    Significant Diagnostic Results in last 30 days:  No results  found.  Assessment/Plan Diarrhea, unspecified type Has responded to [place] it to 2 mg QPM to give him rest at night Change imodium to PRN X  ray did show some Stool burden Trying to help his symptoms with Questran C Diff and Gi Pathogen negative Adenocarcinoma of rectum ([place]) Refusing another round of Chemo He said he is is not sure he wants to go through Radiation either I did mention Hospice discussed with his Daughter also They are open to it. He is very keen about it  Will wait for Dr Chryl Heck tomorrow follow up and see if that is way he should go.  Weakness Will continue to stay here in SNF I don't think he can go back to his Apartment D/W the daughter and she understands Hyponatremia Repeat BMP pending  Cognitive impairment Worsening recently especially needs Cueing with his ADLS MMSE [id number]  Anemia, unspecified type On Iron Multiple skin tears Finishing Doxycyline Looks better   Family/ staff Communication:   Labs/tests ordered:

## 2020-11-13 ENCOUNTER — Inpatient Hospital Stay (HOSPITAL_BASED_OUTPATIENT_CLINIC_OR_DEPARTMENT_OTHER): Payer: Medicare PPO | Admitting: Hematology and Oncology

## 2020-11-13 ENCOUNTER — Encounter: Payer: Self-pay | Admitting: Hematology and Oncology

## 2020-11-13 DIAGNOSIS — C20 Malignant neoplasm of rectum: Secondary | ICD-10-CM

## 2020-11-13 NOTE — Progress Notes (Signed)
[place]   [phone] [phone]   Clinic Follow up Note   Patient Care Team: [name], MD as PCP - General (Internal Medicine) Community, Well Rise Paganini, Curt Bears, MD as Consulting Physician (Ophthalmology) Benay Pike, MD as Consulting Physician (Hematology and Oncology) 11/13/2020  CHIEF COMPLAINT: f/u rectal cancer   ASSESSMENT & PLAN:   87 yo male   Adenocarcinoma rectum, synchronous tumors, T3N1, MSI high in one mass, MSS in second mass Moderate anemia secondary to iron deficiency and GI bleeding, status post IV Venofer and blood transfusion Retinal detachment Chronic back pain  This is a very pleasant 87 year old male patient with T3N1 synchronous tumors of the rectum, MSS in one tumor and MSI H in second tumor who is on 5-fluorouracil and Opdivo for neoadjuvant treatment, received 2 cycles and could not tolerate or schedule further treatment who is here for video visit. He is doing well although still remains in rehab.  He is able to walk well with a walker but still needs help with activities of daily living such as dressing up taking a bath etc.  He tells me that he has definitely made up his mind that he does not want to do any more anticancer treatments at this time.  He is not quite ready to transition to hospice but he is amenable to palliative care engagement.  I have discussed with his nurse Ubaldo Glassing from rehab to engage palliative care with plans to transition to home hospice whenever he clinically deteriorates. I believe the whole family is in agreement with this decision.  His daughter who was right next to him is very agreeable to this plan. I have asked him to keep me updated about his status or to contact me if any further help is needed.  At this time active oncology follow-up will be discontinued.  We will remain available to him as he needs this.  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  No other pathogenic or uncertain variants detected in [facility] Multi-Cancer +RNA Panel.  The report date is October 18, 2020.    The Multi-Cancer + RNA Panel offered by [facility] includes sequencing and/or deletion/duplication analysis of the following 84 genes:  AIP*, ALK, APC*, ATM*, AXIN2*, BAP1*, BARD1*, BLM*, BMPR1A*, BRCA1*, BRCA2*, BRIP1*, CASR, CDC73*, CDH1*, CDK4, CDKN1B*, CDKN1C*, CDKN2A, CEBPA, CHEK2*, CTNNA1*, DICER1*, DIS3L2*, EGFR, EPCAM, FH*, FLCN*, GATA2*, GPC3, GREM1, HOXB13, HRAS, KIT, MAX*, MEN1*, MET, MITF, MLH1*, MSH2*, MSH3*, MSH6*, MUTYH*, NBN*, NF1*, NF2*, NTHL1*, PALB2*, PDGFRA, PHOX2B, PMS2*, POLD1*, POLE*, POT1*, PRKAR1A*, PTCH1*, PTEN*, RAD50*, RAD51C*, RAD51D*, RB1*, RECQL4, RET, RUNX1*, SDHA*, SDHAF2*, SDHB*, SDHC*, SDHD*, SMAD4*, SMARCA4*, SMARCB1*, SMARCE1*, STK11*, SUFU*, TERC, TERT, TMEM127*, Tp53*, TSC1*, TSC2*, VHL*, WRN*, and WT1.  RNA analysis is performed for * genes.    CURRENT THERAPY: 5-fu pump infusion and nivolumab every 2 weeks, starting October 11, 2018  INTERVAL HISTORY:  Mr. Krohn returns is  here for follow-up, video visit along with his daughter and nurse Ubaldo Glassing who is at the rehab. According to him, his immediate goal is to be able to walk around safely and be independent again if possible. His diarrhea still continues about 5-7 bowel movements a day, did not quite improve with Imodium.  Some bowel movements are large, some bowel  movements are small, occasional mucus and blood noted according to the nurse.  He has lost about 10 pounds of weight overall since he last visited [place].  He definitely lost some weight compared to his last video visit. He does have a decent appetite, has been eating what is provided.  He has some new cough, scant sputum, he cannot describe the sputum at this time.  Daughter thinks his belly is a bit distended. He definitely has made up his mind about not trying any more anticancer therapies.  He understands that hospice is an option but he would like to think about it a bit more. No other concerning review of systems discussed today.  MEDICAL HISTORY:  Past Medical History:  Diagnosis Date   Actinic keratosis 04/05/2012   Insomnia, unspecified 06/08/2008   Lumbago 01/02/2003   Neoplasm of uncertain behavior of skin 04/05/2012   Pain in limb 02/05/2011   Retinal detachment 2000   left eye   Screening cholesterol level    Shortness of breath 09/07/2008   Vitamin D deficiency 08/04/2012    SURGICAL HISTORY: Past Surgical History:  Procedure Laterality Date   BIOPSY  08/23/2020   Procedure: BIOPSY;  Surgeon: Doran Stabler, MD;  Location: [facility];  Service: Gastroenterology;;   COLONOSCOPY WITH PROPOFOL N/A 08/23/2020   Procedure: COLONOSCOPY WITH PROPOFOL;  Surgeon: Doran Stabler, MD;  Location: [facility];  Service: Gastroenterology;  Laterality: N/A;   ESOPHAGOGASTRODUODENOSCOPY (EGD) WITH PROPOFOL N/A 08/23/2020   Procedure: ESOPHAGOGASTRODUODENOSCOPY (EGD) WITH PROPOFOL;  Surgeon: Doran Stabler, MD;  Location: [facility];  Service: Gastroenterology;  Laterality: N/A;   EXTERNAL EAR SURGERY  05/2012   left ear    INGUINAL HERNIA REPAIR Right 01/21/2005   Dr. [name]   IR IMAGING GUIDED PORT INSERTION  10/09/2020   LID LESION EXCISION  2009   MOHS SURGERY Left 05/28/2012   ear lobe Dr. [name]   RETINAL DETACHMENT SURGERY Left 1997   [facility]   SUBMUCOSAL INJECTION   08/23/2020   Procedure: SUBMUCOSAL INJECTION;  Surgeon: Doran Stabler, MD;  Location: [facility];  Service: Gastroenterology;;    I have reviewed the social history and family history with the patient and they are unchanged from previous note.  ALLERGIES:  is allergic to benzalkonium chloride, maxitrol [neomycin-polymyxin-dexameth], and neosporin [neomycin-bacitracin zn-polymyx].  MEDICATIONS:  Current Outpatient Medications  Medication Sig Dispense Refill   cholestyramine (QUESTRAN) 4 GM/DOSE powder Take 2 g by mouth every evening.     dexamethasone (DECADRON) 4 MG tablet Take 2 tablets (8 mg total) by mouth daily. Start the day after chemotherapy for 2 days. Take with food. 30 tablet 1   doxycycline (VIBRA-TABS) 100 MG tablet Take 100 mg by mouth 2 (two) times daily.     esomeprazole (NEXIUM) 40 MG capsule TAKE ONE CAPSULE BY MOUTH DAILY 30 capsule 3   iron polysaccharides (NU-IRON) 150 MG capsule Take 1 capsule (150 mg total) by mouth daily. 30 capsule 3   loperamide (IMODIUM A-D) 2 MG tablet Take 2 mg by mouth 2 (two) times daily as needed for diarrhea or loose  stools.     melatonin 3 MG TABS tablet Take 3 mg by mouth at bedtime.     ondansetron (ZOFRAN) 8 MG tablet Take 1 tablet (8 mg total) by mouth 2 (two) times daily as needed for refractory nausea / vomiting. Start on day 3 after chemotherapy. 30 tablet 1   Polyethyl Glycol-Propyl Glycol (SYSTANE) 0.4-0.3 % SOLN Apply 2 drops to eye 3 (three) times daily as needed.     prochlorperazine (COMPAZINE) 10 MG tablet Take 1 tablet (10 mg total) by mouth every 6 (six) hours as needed (Nausea or vomiting). 30 tablet 1   vitamin B-12 (CYANOCOBALAMIN) 1000 MCG tablet Take 1 Tablet Daily 100 tablet 1   Vitamin D, Ergocalciferol, (DRISDOL) 1.25 MG (50000 UNIT) CAPS capsule TAKE ONE CAPSULE ONCE A WEEK FOR VITAMIN D SUPPLEMENT. 4 capsule 1   No current facility-administered medications for this visit.    PHYSICAL EXAMINATION: ECOG  PERFORMANCE STATUS: 2 - Symptomatic, <50% confined to bed  There were no vitals filed for this visit.  There were no vitals filed for this visit.  V/S and PE deferred, video visit.  LABORATORY DATA:  I have reviewed the data as listed CBC Latest Ref Rng & Units 11/08/2020 11/01/2020 10/23/2020  WBC - 3.8 6.5 4.6  Hemoglobin 13.5 - 17.5 9.6(A) 10.1(A) 9.2(L)  Hematocrit 41 - 53 28(A) 30(A) 27.3(L)  Platelets 150 - 399 185 369 281     CMP Latest Ref Rng & Units 11/08/2020 11/01/2020 10/23/2020  Glucose 70 - 99 mg/dL - - 106(H)  BUN 4 - _0 Creatinine 0.6 - 1.3 0.7 0.8 0.79  Sodium 137 - 147 128(A) 131(A) 126(L)  Potassium 3.4 - 5.3 4.7 4.3 4.4  Chloride 99 - 108 93(A) 96(A) 92(L)  CO2 13 - 22 26(A) 27(A) 26  Calcium 8.7 - 10.7 9.1 8.8 9.0  Total Protein 6.5 - 8.1 g/dL - - 6.2(L)  Total Bilirubin 0.3 - 1.2 mg/dL - - 0.4  Alkaline Phos 25 - 125 - 89 79  AST 14 - 40 - 17 21  ALT 10 - 40 - 21 17    RADIOGRAPHIC STUDIES: I have personally reviewed the radiological images as listed and agreed with the findings in the report. No results found.   I connected with  Shelby Dubin on 11/13/20 by a video enabled telemedicine application and verified that I am speaking with the correct person using two identifiers.   I discussed the limitations of evaluation and management by telemedicine. The patient expressed understanding and agreed to proceed.  All questions were answered. The patient knows to call the clinic with any problems, questions or concerns. No barriers to learning was detected. I spent 20 minutes in the care of this patient including reviewing history, discussion about the plan of care, discussion with the nursing about engaging palliative care. We have discussed about considering hospice since he declined any anticancer treatments at this time.  He would definitely like to think about it, he is acceptable to engaging palliative care at this time. Since he does not want to  proceed with any further anticancer treatment, will be available to him as needed at this point.    Benay Pike, MD 11/13/20

## 2020-11-15 LAB — BASIC METABOLIC PANEL
BUN: 14 (ref 4–21)
CO2: 29 — AB (ref 13–22)
Chloride: 90 — AB (ref 99–108)
Creatinine: 0.7 (ref 0.6–1.3)
Glucose: 100
Potassium: 4.6 (ref 3.4–5.3)
Sodium: 128 — AB (ref 137–147)

## 2020-11-15 LAB — COMPREHENSIVE METABOLIC PANEL: Calcium: 9.4 (ref 8.7–10.7)

## 2020-11-19 ENCOUNTER — Encounter: Payer: Self-pay | Admitting: Hematology and Oncology

## 2020-11-20 ENCOUNTER — Inpatient Hospital Stay: Payer: Medicare PPO

## 2020-11-20 ENCOUNTER — Inpatient Hospital Stay: Payer: Medicare PPO | Admitting: Hematology and Oncology

## 2020-11-22 ENCOUNTER — Inpatient Hospital Stay: Payer: Medicare PPO

## 2020-11-22 ENCOUNTER — Telehealth: Payer: Self-pay | Admitting: Hematology and Oncology

## 2020-11-22 LAB — BASIC METABOLIC PANEL
BUN: 17 (ref 4–21)
CO2: 26 — AB (ref 13–22)
Chloride: 92 — AB (ref 99–108)
Creatinine: 0.6 (ref 0.6–1.3)
Glucose: 98
Potassium: 4.7 (ref 3.4–5.3)
Sodium: 126 — AB (ref 137–147)

## 2020-11-22 LAB — COMPREHENSIVE METABOLIC PANEL: Calcium: 9 (ref 8.7–10.7)

## 2020-11-22 NOTE — Telephone Encounter (Signed)
Scheduled per sch msg. Called not able to leave msg. Mailed printout

## 2020-11-23 ENCOUNTER — Non-Acute Institutional Stay (SKILLED_NURSING_FACILITY): Payer: Medicare PPO | Admitting: Adult Health

## 2020-11-23 ENCOUNTER — Encounter: Payer: Self-pay | Admitting: Adult Health

## 2020-11-23 DIAGNOSIS — R197 Diarrhea, unspecified: Secondary | ICD-10-CM | POA: Diagnosis not present

## 2020-11-23 DIAGNOSIS — E871 Hypo-osmolality and hyponatremia: Secondary | ICD-10-CM | POA: Diagnosis not present

## 2020-11-23 DIAGNOSIS — C20 Malignant neoplasm of rectum: Secondary | ICD-10-CM | POA: Diagnosis not present

## 2020-11-23 NOTE — Progress Notes (Signed)
Location:   [facility] Room Number: 156 Place of Service:  SNF [phone]) Provider:  Royal Hawthorn, NP  [name], MD  Patient Care Team: [name], MD as PCP - General (Internal Medicine) Community, [facility] [name], MD as Consulting Physician (Ophthalmology) Benay Pike, MD as Consulting Physician (Hematology and Oncology)  Extended Emergency Contact Information Primary Emergency Contact: [place], [place] [address] Johnnette Litter of [place] Phone: [phone] Mobile Phone: [phone] Relation: Daughter  Code Status:  DNR Goals of care: Advanced Directive information Advanced Directives 11/23/2020  Does Patient Have a Medical Advance Directive? Yes  Type of Paramedic of [place];Living will;Out of facility DNR (pink MOST or yellow form)  Does patient want to make changes to medical advance directive? No - Patient declined  Copy of [place] in Chart? Yes - validated most recent copy scanned in chart (See row information)  Would patient like information on creating a medical advance directive? -  Pre-existing out of facility DNR order (yellow form or pink MOST form) Yellow form placed in chart (order not valid for inpatient use)     Chief Complaint  Patient presents with   Acute Visit    Diarrhea     HPI:  Pt is a 87 y.o. male seen today for an acute visit for diarrhea and hyponatremia. PMH significant for iron def anemia due to chronic blood loss and adenocarcinoma of the rectum.  He has received 2 cycles of chemotherapy with 5-fluorouracil and Opdivo but decided to hold off on further treatment due to weakness and diarrhea. He is considering hospice and is moving to skilled care.   While in rehab he has gained strength and is ambulatory with a walker. We have been monitoring his sodium which was running 128 despite encouraged fluid  intake. BMP returned on 9/22 with NA of 126. He is having loose stools. This is some better with [place]. He does not feel any different. He does feel dizzy when standing quickly and is encouraged to rise slowly. When I went to visit him he had just taken a lap around the unit and was feeling well.  No abd pain or nausea.    Past Medical History:  Diagnosis Date   Actinic keratosis 04/05/2012   Insomnia, unspecified 06/08/2008   Lumbago 01/02/2003   Neoplasm of uncertain behavior of skin 04/05/2012   Pain in limb 02/05/2011   Retinal detachment 2000   left eye   Screening cholesterol level    Shortness of breath 09/07/2008   Vitamin D deficiency 08/04/2012   Past Surgical History:  Procedure Laterality Date   BIOPSY  08/23/2020   Procedure: BIOPSY;  Surgeon: Doran Stabler, MD;  Location: [facility];  Service: Gastroenterology;;   COLONOSCOPY WITH PROPOFOL N/A 08/23/2020   Procedure: COLONOSCOPY WITH PROPOFOL;  Surgeon: Doran Stabler, MD;  Location: [facility];  Service: Gastroenterology;  Laterality: N/A;   ESOPHAGOGASTRODUODENOSCOPY (EGD) WITH PROPOFOL N/A 08/23/2020   Procedure: ESOPHAGOGASTRODUODENOSCOPY (EGD) WITH PROPOFOL;  Surgeon: Doran Stabler, MD;  Location: [facility];  Service: Gastroenterology;  Laterality: N/A;   EXTERNAL EAR SURGERY  05/2012   left ear    INGUINAL HERNIA REPAIR Right 01/21/2005   Dr. [name]   IR IMAGING GUIDED PORT INSERTION  10/09/2020   LID LESION EXCISION  2009   MOHS SURGERY Left 05/28/2012   ear lobe Dr.  Myracle   RETINAL DETACHMENT SURGERY Left [place] INJECTION  08/23/2020   Procedure: SUBMUCOSAL INJECTION;  Surgeon: Doran Stabler, MD;  Location: [facility];  Service: Gastroenterology;;    Allergies  Allergen Reactions   Benzalkonium Chloride Rash    Very allergic per patient    Maxitrol [Neomycin-Polymyxin-Dexameth] Itching and Rash   Neosporin [Neomycin-Bacitracin Zn-Polymyx] Itching and Rash     Allergies as of 11/23/2020       Reactions   Benzalkonium Chloride Rash   Very allergic per patient    Maxitrol [neomycin-polymyxin-dexameth] Itching, Rash   Neosporin [neomycin-bacitracin Zn-polymyx] Itching, Rash        Medication List        Accurate as of November 23, 2020 12:53 PM. If you have any questions, ask your nurse or doctor.          cholestyramine 4 GM/DOSE powder Commonly known as: QUESTRAN Take 2-4 g by mouth every evening.   dexamethasone 4 MG tablet Commonly known as: DECADRON Take 2 tablets (8 mg total) by mouth daily. Start the day after chemotherapy for 2 days. Take with food.   esomeprazole 40 MG capsule Commonly known as: NEXIUM TAKE ONE CAPSULE BY MOUTH DAILY   iron polysaccharides 150 MG capsule Commonly known as: Nu-Iron Take 1 capsule (150 mg total) by mouth daily.   loperamide 2 MG tablet Commonly known as: IMODIUM A-D Take 2 mg by mouth 2 (two) times daily as needed for diarrhea or loose stools.   melatonin 3 MG Tabs tablet Take 3 mg by mouth at bedtime.   ondansetron 8 MG tablet Commonly known as: Zofran Take 1 tablet (8 mg total) by mouth 2 (two) times daily as needed for refractory nausea / vomiting. Start on day 3 after chemotherapy.   prochlorperazine 10 MG tablet Commonly known as: COMPAZINE Take 1 tablet (10 mg total) by mouth every 6 (six) hours as needed (Nausea or vomiting).   Systane 0.4-0.3 % Soln Generic drug: Polyethyl Glycol-Propyl Glycol Apply 2 drops to eye 3 (three) times daily as needed.   vitamin B-12 1000 MCG tablet Commonly known as: CYANOCOBALAMIN Take 1 Tablet Daily   Vitamin D (Ergocalciferol) 1.25 MG (50000 UNIT) Caps capsule Commonly known as: DRISDOL TAKE ONE CAPSULE ONCE A WEEK FOR VITAMIN D SUPPLEMENT.        Review of Systems  Constitutional:  Negative for activity change, appetite change, chills, diaphoresis, fatigue, fever and unexpected weight change.  Respiratory:  Negative for  cough, shortness of breath, wheezing and stridor.   Cardiovascular:  Negative for chest pain, palpitations and leg swelling.  Gastrointestinal:  Positive for diarrhea. Negative for abdominal distention, abdominal pain, constipation, nausea, rectal pain and vomiting.  Genitourinary:  Negative for difficulty urinating and dysuria.  Musculoskeletal:  Positive for gait problem. Negative for arthralgias, back pain, joint swelling and myalgias.  Neurological:  Negative for dizziness (when standing quickly), seizures, syncope, facial asymmetry, speech difficulty, weakness and headaches.  Hematological:  Negative for adenopathy. Does not bruise/bleed easily.  Psychiatric/Behavioral:  Negative for agitation, behavioral problems and confusion.    Immunization History  Administered Date(s) Administered   Hep A / Hep B 07/21/2001, 08/18/2001   Hepatitis A, Ped/Adol-2 Dose 01/24/2002   IPV 08/18/2001   Influenza Whole 12/02/2011, 12/31/2012   Influenza, High Dose Seasonal PF 12/10/2017, 12/10/2018, 12/30/2019   Influenza,inj,Quad PF,6+ Mos 12/20/2013   Influenza-Unspecified 12/21/2014, 12/27/2015, 12/22/2016, 12/10/2018   Moderna Sars-Covid-2 Vaccination 03/25/2019, 04/12/2019, 01/17/2020  Pneumococcal Conjugate-13 11/22/2014   Pneumococcal Polysaccharide-23 07/21/2001, 12/03/2016   Td 04/12/2009   Tdap 12/10/2017, 05/07/2020   Typhoid Inactivated 07/21/2001, 04/12/2009   Zoster, Live 02/01/2011   Pertinent  Health Maintenance Due  Topic Date Due   INFLUENZA VACCINE  10/01/2020   Fall Risk  09/17/2020 02/08/2020 01/04/2020 12/21/2019 08/03/2019  Falls in the past year? 1 0 0 0 0  Number falls in past yr: 0 0 0 0 0  Comment - - - - -  Injury with Fall? 1 0 0 0 0  Comment - - - - -  Follow up Falls evaluation completed - - - -   Functional Status Survey:    Vitals:   11/23/20 0852  BP: (!) 159/76  Pulse: 70  Resp: 20  Temp: 97.9 F (36.6 C)  SpO2: 97%  Weight: 136 lb 6.4 oz (61.9 kg)   Height: 5\' 11"  (1.803 m)   Body mass index is 19.02 kg/m. Physical Exam Vitals and nursing note reviewed.  Constitutional:      General: He is not in acute distress.    Appearance: He is not diaphoretic.  HENT:     Head: Normocephalic and atraumatic.  Neck:     Thyroid: No thyromegaly.     Vascular: No JVD.     Trachea: No tracheal deviation.  Cardiovascular:     Rate and Rhythm: Normal rate and regular rhythm.     Heart sounds: Murmur heard.  Pulmonary:     Effort: Pulmonary effort is normal. No respiratory distress.     Breath sounds: Normal breath sounds. No wheezing.  Abdominal:     General: Bowel sounds are normal. There is no distension.     Palpations: Abdomen is soft.     Tenderness: There is no abdominal tenderness.  Musculoskeletal:     Right lower leg: No edema.     Left lower leg: No edema.  Lymphadenopathy:     Cervical: No cervical adenopathy.  Skin:    General: Skin is warm and dry.  Neurological:     Mental Status: He is alert and oriented to person, place, and time.     Cranial Nerves: No cranial nerve deficit.    Labs reviewed: Recent Labs    10/03/20 1207 10/10/20 0943 10/23/20 0858 11/01/20 0000 11/08/20 0000 11/15/20 0000 11/22/20 0000  NA 132* 133* 126*   < > 128* 128* 126*  K 4.5 4.4 4.4   < > 4.7 4.6 4.7  CL 100 100 92*   < > 93* 90* 92*  CO2 25 25 26    < > 26* 29* 26*  GLUCOSE 112* 110* 106*  --   --   --   --   BUN 13 15 13    < > 15 14 17   CREATININE 0.86 0.76 0.79   < > 0.7 0.7 0.6  CALCIUM 9.1 9.3 9.0   < > 9.1 9.4 9.0   < > = values in this interval not displayed.   Recent Labs    10/03/20 1207 10/10/20 0943 10/23/20 0858 11/01/20 0000  AST 15 16 21 17   ALT 12 12 17 21   ALKPHOS 74 79 79 89  BILITOT 0.3 0.6 0.4  --   PROT 6.4* 6.5 6.2*  --   ALBUMIN 3.3* 3.3* 3.3* 3.6   Recent Labs    10/03/20 1207 10/09/20 1330 10/10/20 0943 10/23/20 0858 11/01/20 0000 11/08/20 0000  WBC 7.3 7.6 6.8 4.6 6.5 3.8  NEUTROABS  5.5  --  5.0 3.2  --   --   HGB 7.3* 10.6* 9.9* 9.2* 10.1* 9.6*  HCT 23.2* 34.2* 31.2* 27.3* 30* 28*  MCV 78.4* 84.0 83.0 80.8  --   --   PLT 354 357 302 281 369 185   Lab Results  Component Value Date   TSH 1.495 10/23/2020   Lab Results  Component Value Date   HGBA1C 5.9 03/09/2012   Lab Results  Component Value Date   CHOL 189 12/16/2018   HDL 69 12/16/2018   LDLCALC 111 12/16/2018   TRIG 45 12/16/2018    Significant Diagnostic Results in last 30 days:  No results found.  Assessment/Plan  1. Hyponatremia Recommend substituting gatorade and/or broth for water more frequently. He is minimally symptomatic so we are not going the route of IVF.   Recheck BMP 1 week  2. Diarrhea, unspecified type He continues to have loose stools with some improvement with Questran Will add imodium each night. Monitor for constipation   3. Adenocarcinoma of [facility]um [facility]) S/p Chemo, no further treatment per pt Considering hospice.    Family/ staff Communication: discussed with his nurse Dr Jeannine Kitten  Labs/tests ordered:   repeat bmp 1 week

## 2020-11-26 ENCOUNTER — Telehealth: Payer: Self-pay | Admitting: Radiation Oncology

## 2020-11-26 ENCOUNTER — Ambulatory Visit: Payer: Medicare PPO | Admitting: Hematology and Oncology

## 2020-11-26 ENCOUNTER — Ambulatory Visit: Payer: Medicare PPO

## 2020-11-26 ENCOUNTER — Other Ambulatory Visit: Payer: Medicare PPO

## 2020-11-26 NOTE — Telephone Encounter (Signed)
Called to r/s appt. Patient has changed his mind and is no longer interested in consult or XRT at this time. PA advised.

## 2020-11-30 ENCOUNTER — Non-Acute Institutional Stay (SKILLED_NURSING_FACILITY): Payer: Medicare PPO | Admitting: Adult Health

## 2020-11-30 ENCOUNTER — Encounter: Payer: Self-pay | Admitting: Adult Health

## 2020-11-30 DIAGNOSIS — R0602 Shortness of breath: Secondary | ICD-10-CM

## 2020-11-30 DIAGNOSIS — C20 Malignant neoplasm of rectum: Secondary | ICD-10-CM | POA: Diagnosis not present

## 2020-11-30 DIAGNOSIS — E871 Hypo-osmolality and hyponatremia: Secondary | ICD-10-CM | POA: Diagnosis not present

## 2020-11-30 DIAGNOSIS — R140 Abdominal distension (gaseous): Secondary | ICD-10-CM

## 2020-11-30 DIAGNOSIS — R197 Diarrhea, unspecified: Secondary | ICD-10-CM | POA: Diagnosis not present

## 2020-11-30 LAB — BASIC METABOLIC PANEL
BUN: 18 (ref 4–21)
CO2: 23 — AB (ref 13–22)
Chloride: 95 — AB (ref 99–108)
Creatinine: 0.7 (ref 0.6–1.3)
Glucose: 91
Potassium: 4.5 (ref 3.4–5.3)
Sodium: 131 — AB (ref 137–147)

## 2020-11-30 LAB — COMPREHENSIVE METABOLIC PANEL: Calcium: 9.7 (ref 8.7–10.7)

## 2020-11-30 MED ORDER — MORPHINE SULFATE (CONCENTRATE) 20 MG/ML PO SOLN: 5.0000 mg | ORAL | 0 refills | Status: DC | PRN

## 2020-11-30 MED ORDER — LORAZEPAM 0.5 MG PO TABS: 0.5000 mg | ORAL_TABLET | ORAL | 0 refills | Status: DC | PRN

## 2020-11-30 NOTE — Progress Notes (Signed)
Location:  Occupational psychologist of Service:  SNF (31) Provider:   Cindi [place], [place] [phone]   [name], MD  Patient Care Team: [name], MD as PCP - General (Internal Medicine) Community, [facility] [name], MD as Consulting Physician (Ophthalmology) Benay Pike, MD as Consulting Physician (Hematology and Oncology)  Extended Emergency Contact Information Primary Emergency Contact: [place], [place] [address] Johnnette Litter of [place] Phone: [phone] Mobile Phone: [phone] Relation: Daughter  Code Status:  DNR Goals of care: Advanced Directive information Advanced Directives 11/23/2020  Does Patient Have a Medical Advance Directive? Yes  Type of Paramedic of [place];Living will;Out of facility DNR (pink MOST or yellow form)  Does patient want to make changes to medical advance directive? No - Patient declined  Copy of [place] in Chart? Yes - validated most recent copy scanned in chart (See row information)  Would patient like information on creating a medical advance directive? -  Pre-existing out of facility DNR order (yellow form or pink MOST form) Yellow form placed in chart (order not valid for inpatient use)     Chief Complaint  Patient presents with   Acute Visit    sob    HPI:  Pt is a 87 y.o. male seen today for an acute visit for sob, confusion, and diaphoresis. PMH significant for iron def anemia due to chronic blood loss and adenocarcinoma of the rectum. He has received 2 cycles of chemotherapy with 5-fluorouracil and Opdivo but decided to hold off on further treatment due to weakness and diarrhea.  Nurse reports this am he was more confused, weak sob, and diaphoretic. Oxygen was applied due to sob sats are in the 90s before and after 02. He had some mucus an congestion in the back of his throat as  well so the nurse is getting suction to the bedside.  He denies any abd pain. Has frequent small loose stools. NO fever. Abd is more distended.  NA has been running low, last one 126. Labs drawn this morning and pending.    Past Medical History:  Diagnosis Date   Actinic keratosis 04/05/2012   Insomnia, unspecified 06/08/2008   Lumbago 01/02/2003   Neoplasm of uncertain behavior of skin 04/05/2012   Pain in limb 02/05/2011   Retinal detachment 2000   left eye   Screening cholesterol level    Shortness of breath 09/07/2008   Vitamin D deficiency 08/04/2012   Past Surgical History:  Procedure Laterality Date   BIOPSY  08/23/2020   Procedure: BIOPSY;  Surgeon: Doran Stabler, MD;  Location: [facility];  Service: Gastroenterology;;   COLONOSCOPY WITH PROPOFOL N/A 08/23/2020   Procedure: COLONOSCOPY WITH PROPOFOL;  Surgeon: Doran Stabler, MD;  Location: [facility];  Service: Gastroenterology;  Laterality: N/A;   ESOPHAGOGASTRODUODENOSCOPY (EGD) WITH PROPOFOL N/A 08/23/2020   Procedure: ESOPHAGOGASTRODUODENOSCOPY (EGD) WITH PROPOFOL;  Surgeon: Doran Stabler, MD;  Location: [facility];  Service: Gastroenterology;  Laterality: N/A;   EXTERNAL EAR SURGERY  05/2012   left ear    INGUINAL HERNIA REPAIR Right 01/21/2005   Dr. [name]   IR IMAGING GUIDED PORT INSERTION  10/09/2020   LID LESION EXCISION  2009   MOHS SURGERY Left 05/28/2012   ear lobe Dr. [name]   RETINAL DETACHMENT SURGERY Left 1997   [facility]   SUBMUCOSAL INJECTION  08/23/2020   Procedure: SUBMUCOSAL INJECTION;  Surgeon: Doran Stabler, MD;  Location: [facility];  Service: Gastroenterology;;    Allergies  Allergen Reactions   Benzalkonium Chloride Rash    Very allergic per patient    Maxitrol [Neomycin-Polymyxin-Dexameth] Itching and Rash   Neosporin [Neomycin-Bacitracin Zn-Polymyx] Itching and Rash    Outpatient Encounter Medications as of 11/30/2020  Medication Sig   cholestyramine (QUESTRAN) 4 GM/DOSE  powder Take 2-4 g by mouth 2 (two) times daily with a meal. 2 grams in the am and 4 grams in the pm   esomeprazole (NEXIUM) 40 MG capsule TAKE ONE CAPSULE BY MOUTH DAILY   iron polysaccharides (NU-IRON) 150 MG capsule Take 1 capsule (150 mg total) by mouth daily.   loperamide (IMODIUM A-D) 2 MG tablet Take 2 mg by mouth 2 (two) times daily as needed for diarrhea or loose stools.   loperamide (IMODIUM) 2 MG capsule Take 2 mg by mouth at bedtime.   melatonin 3 MG TABS tablet Take 3 mg by mouth at bedtime.   ondansetron (ZOFRAN) 8 MG tablet Take 1 tablet (8 mg total) by mouth 2 (two) times daily as needed for refractory nausea / vomiting. Start on day 3 after chemotherapy.   Polyethyl Glycol-Propyl Glycol (SYSTANE) 0.4-0.3 % SOLN Apply 2 drops to eye 3 (three) times daily as needed.   prochlorperazine (COMPAZINE) 10 MG tablet Take 1 tablet (10 mg total) by mouth every 6 (six) hours as needed (Nausea or vomiting).   vitamin B-12 (CYANOCOBALAMIN) 1000 MCG tablet Take 1 Tablet Daily   Vitamin D, Ergocalciferol, (DRISDOL) 1.25 MG (50000 UNIT) CAPS capsule TAKE ONE CAPSULE ONCE A WEEK FOR VITAMIN D SUPPLEMENT.   No facility-administered encounter medications on file as of 11/30/2020.    Review of Systems  Constitutional:  Positive for activity change, appetite change, diaphoresis and fatigue. Negative for chills, fever and unexpected weight change.  HENT:  Positive for congestion.   Respiratory:  Positive for shortness of breath. Negative for cough, wheezing and stridor.   Cardiovascular:  Negative for chest pain, palpitations and leg swelling.  Gastrointestinal:  Positive for abdominal pain, blood in stool and diarrhea. Negative for abdominal distention, constipation and nausea.  Genitourinary:  Negative for difficulty urinating and dysuria.  Musculoskeletal:  Positive for gait problem. Negative for arthralgias, back pain, joint swelling and myalgias.  Neurological:  Positive for weakness. Negative  for dizziness, seizures, syncope, facial asymmetry, speech difficulty and headaches.  Hematological:  Negative for adenopathy. Does not bruise/bleed easily.  Psychiatric/Behavioral:  Positive for confusion. Negative for agitation and behavioral problems.    Immunization History  Administered Date(s) Administered   Hep A / Hep B 07/21/2001, 08/18/2001   Hepatitis A, Ped/Adol-2 Dose 01/24/2002   IPV 08/18/2001   Influenza Whole 12/02/2011, 12/31/2012   Influenza, High Dose Seasonal PF 12/10/2017, 12/10/2018, 12/30/2019   Influenza,inj,Quad PF,6+ Mos 12/20/2013   Influenza-Unspecified 12/21/2014, 12/27/2015, 12/22/2016, 12/10/2018   Moderna Sars-Covid-2 Vaccination 03/25/2019, 04/12/2019, 01/17/2020   Pneumococcal Conjugate-13 11/22/2014   Pneumococcal Polysaccharide-23 07/21/2001, 12/03/2016   Td 04/12/2009   Tdap 12/10/2017, 05/07/2020   Typhoid Inactivated 07/21/2001, 04/12/2009   Zoster, Live 02/01/2011   Pertinent  Health Maintenance Due  Topic Date Due   INFLUENZA VACCINE  10/01/2020   Fall Risk  09/17/2020 02/08/2020 01/04/2020 12/21/2019 08/03/2019  Falls in the past year? 1 0 0 0 0  Number falls in past yr: 0 0 0 0 0  Comment - - - - -  Injury with  Fall? 1 0 0 0 0  Comment - - - - -  Follow up Falls evaluation completed - - - -   Functional Status Survey:    Vitals:   11/30/20 0916  BP: (!) 145/94  Pulse: 85  Resp: (!) 22  Temp: 97.9 F (36.6 C)  SpO2: 98%   There is no height or weight on file to calculate BMI. Physical Exam Vitals and nursing note reviewed.  Constitutional:      General: He is not in acute distress.    Appearance: He is not diaphoretic.  HENT:     Head: Normocephalic and atraumatic.     Nose: Nose normal. No congestion.     Mouth/Throat:     Mouth: Mucous membranes are moist.     Pharynx: Oropharynx is clear.  Neck:     Thyroid: No thyromegaly.     Vascular: No JVD.     Trachea: No tracheal deviation.  Cardiovascular:     Rate and  Rhythm: Normal rate and regular rhythm.     Heart sounds: No murmur heard. Pulmonary:     Effort: Pulmonary effort is normal. No respiratory distress.     Breath sounds: Normal breath sounds. No wheezing.  Abdominal:     General: There is distension.     Palpations: Abdomen is soft.     Tenderness: There is no abdominal tenderness.     Comments: Hypoactive x 4  Musculoskeletal:     Right lower leg: No edema.     Left lower leg: No edema.  Lymphadenopathy:     Cervical: No cervical adenopathy.  Skin:    General: Skin is warm and dry.  Neurological:     Mental Status: He is alert.     Comments: Able to f/c and answer questions intermittently. Keeps his eyes closed. States his daughter's name repetitively.     Labs reviewed: Recent Labs    10/03/20 1207 10/10/20 0943 10/23/20 0858 11/01/20 0000 11/08/20 0000 11/15/20 0000 11/22/20 0000  NA 132* 133* 126*   < > 128* 128* 126*  K 4.5 4.4 4.4   < > 4.7 4.6 4.7  CL 100 100 92*   < > 93* 90* 92*  CO2 25 25 26    < > 26* 29* 26*  GLUCOSE 112* 110* 106*  --   --   --   --   BUN 13 15 13    < > 15 14 17   CREATININE 0.86 0.76 0.79   < > 0.7 0.7 0.6  CALCIUM 9.1 9.3 9.0   < > 9.1 9.4 9.0   < > = values in this interval not displayed.   Recent Labs    10/03/20 1207 10/10/20 0943 10/23/20 0858 11/01/20 0000  AST 15 16 21 17   ALT 12 12 17 21   ALKPHOS 74 79 79 89  BILITOT 0.3 0.6 0.4  --   PROT 6.4* 6.5 6.2*  --   ALBUMIN 3.3* 3.3* 3.3* 3.6   Recent Labs    10/03/20 1207 10/09/20 1330 10/10/20 0943 10/23/20 0858 11/01/20 0000 11/08/20 0000  WBC 7.3 7.6 6.8 4.6 6.5 3.8  NEUTROABS 5.5  --  5.0 3.2  --   --   HGB 7.3* 10.6* 9.9* 9.2* 10.1* 9.6*  HCT 23.2* 34.2* 31.2* 27.3* 30* 28*  MCV 78.4* 84.0 83.0 80.8  --   --   PLT 354 357 302 281 369 185   Lab Results  Component Value Date   TSH  1.495 10/23/2020   Lab Results  Component Value Date   HGBA1C 5.9 03/09/2012   Lab Results  Component Value Date   CHOL 189  12/16/2018   HDL 69 12/16/2018   LDLCALC 111 12/16/2018   TRIG 45 12/16/2018    Significant Diagnostic Results in last 30 days:  No results found.  Assessment/Plan 1. SOB (shortness of breath) Improved with oxygen Appears to be nearing the end of life Will add roxanol and ativan for comfort Prn atropine for excess secretions.  Discontinue unnecessary meds.   2. Adenocarcinoma of rectum ([place]) Could not tolerate chemo treatments Has decided to go with comfort care No hospitalizations DNR in place Declined hospice but has support through [facility], [place], and his daughter.   3. Hyponatremia Has been running low NA 126-128 and is now experiencing increased confusion and sob BMP pending  4. Diarrhea, unspecified type Small frequent bms through the day Not able to safely swallow Lucrezia Starch so will discontinue but leave prn imodium  5. Abdominal distention Possible constipation due to cancer/meds and excess gas.   Will discontinue scheduled imodium Pt declined abd xray due to goals of care  Add dulcolax as needed.     Family/ staff Communication: discussed with his daughter Olive Bass and Dr Jeannine Kitten  Labs/tests ordered:  BMP pending, no further testing per goals of care

## 2020-12-05 ENCOUNTER — Inpatient Hospital Stay: Payer: Medicare PPO

## 2020-12-05 ENCOUNTER — Inpatient Hospital Stay: Payer: Medicare PPO | Admitting: Hematology and Oncology

## 2020-12-06 ENCOUNTER — Encounter: Payer: Self-pay | Admitting: Hematology and Oncology

## 2020-12-06 NOTE — Progress Notes (Signed)
HPI:  Alec Weiss was previously seen in the [facility] due to a personal history of cancer and concerns regarding a hereditary predisposition to cancer. Please refer to our prior cancer genetics clinic note for more information regarding our discussion, assessment and recommendations, at the time. Alec Weiss recent genetic test results were disclosed to his daughter, Alec Weiss (per his request), as were recommendations warranted by these results. These results and recommendations are discussed in more detail below.  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  No other pathogenic or uncertain variants detected in [facility] Multi-Cancer +RNA Panel.  The report date is October 18, 2020.    The Multi-Cancer + RNA Panel offered by [facility] includes sequencing and/or deletion/duplication analysis of the following 84 genes:  AIP*, ALK, APC*, ATM*, AXIN2*, BAP1*, BARD1*, BLM*, BMPR1A*, BRCA1*, BRCA2*, BRIP1*, CASR, CDC73*, CDH1*, CDK4, CDKN1B*, CDKN1C*, CDKN2A, CEBPA, CHEK2*, CTNNA1*, DICER1*, DIS3L2*, EGFR, EPCAM, FH*, FLCN*, GATA2*, GPC3, GREM1, HOXB13, HRAS, KIT, MAX*, MEN1*, MET, MITF, MLH1*, MSH2*, MSH3*, MSH6*, MUTYH*, NBN*, NF1*, NF2*, NTHL1*, PALB2*, PDGFRA, PHOX2B, PMS2*, POLD1*, POLE*, POT1*, PRKAR1A*, PTCH1*, PTEN*, RAD50*, RAD51C*, RAD51D*, RB1*, RECQL4, RET, RUNX1*, SDHA*, SDHAF2*, SDHB*, SDHC*, SDHD*, SMAD4*, SMARCA4*, SMARCB1*, SMARCE1*, STK11*, SUFU*, TERC,  TERT, TMEM127*, Tp53*, TSC1*, TSC2*, VHL*, WRN*, and WT1.  RNA analysis is performed for * genes.     FAMILY HISTORY:  We obtained a detailed, 4-generation family history.  Significant diagnoses are listed below: Family History  Problem Relation Age of Onset   Breast cancer Mother        dx after 31   Alec Weiss is unaware of previous family history of genetic testing for hereditary cancer risks. There is no reported Ashkenazi Jewish ancestry. There is no known consanguinity.    GENETIC TEST RESULTS: Genetic testing reported out on October 18, 2020. The [facility] Multi-Cancer +RNA Panel detected a possibly mosaic pathogenic variant in NF1 called c.3826C>T ([id number]*).  No other pathogenic variants were detected. The Multi-Cancer + RNA Panel offered by [facility] includes sequencing and/or deletion/duplication analysis of the following 84 genes:  AIP*, ALK, APC*, ATM*, AXIN2*, BAP1*, BARD1*, BLM*, BMPR1A*, BRCA1*, BRCA2*, BRIP1*, CASR, CDC73*, CDH1*, CDK4, CDKN1B*, CDKN1C*, CDKN2A, CEBPA, CHEK2*, CTNNA1*, DICER1*, DIS3L2*, EGFR, EPCAM, FH*, FLCN*, GATA2*, GPC3, GREM1, HOXB13, HRAS, KIT, MAX*, MEN1*, MET, MITF, MLH1*, MSH2*, MSH3*, MSH6*, MUTYH*, NBN*, NF1*, NF2*, NTHL1*, PALB2*, PDGFRA, PHOX2B, PMS2*, POLD1*, POLE*, POT1*, PRKAR1A*, PTCH1*, PTEN*, RAD50*, RAD51C*, RAD51D*, RB1*, RECQL4, RET, RUNX1*, SDHA*, SDHAF2*, SDHB*, SDHC*, SDHD*, SMAD4*, SMARCA4*, SMARCB1*, SMARCE1*, STK11*, SUFU*, TERC, TERT, TMEM127*, Tp53*, TSC1*, TSC2*, VHL*, WRN*, and WT1.  RNA analysis is performed for * genes.  The test report has been scanned into EPIC and is located under the Molecular Pathology section of the Results Review tab.  A portion of the result report is included below for reference.    We discussed with Alec Weiss daughter that the possibly mosaic result means that  this variant  is present in some, but not all, cells in this peripheral blood specimen.  Some mosaic variants may have been present from birth,  while others may have been acquired later on. When a variant is present from birth, it can cause a genetic condition in the individual. In this case, the variant is in the blood and at least some other tissues. The degree of symptoms may depend on the extent of tissues involved. There is a chance the variant could be passed on to children.  Given his age and his diagnosis of monoclonal gammopathy of unknown significance (MGUS), the possibly mosaic variant in NF1 was most likely acquired later in life. When a variant is acquired later in life and is confined to the blood, it is called clonal hematopoiesis. The likelihood of clonal hematopoiesis increases with an individual's age or exposure to chemotherapy or radiation treatment. Sometimes acquired variants may be the result of a blood disorder that has already occurred. In these cases, the variant would not cause a genetic condition in the individual and would not be passed on to family members.  We discussed the option of a skin punch biopsy to determine if the NF1 variant was present in other tissues; however, this was not recommended given a negative result cannot rule out germline mosaicism.  We discussed the availability of family testing; although there is a low likelihood that his children carry the NF1 variant given that they do not show signs and symptoms of neurofibromatosis.   This result does not explain Alec Weiss personal or family history of cancer. We discussed with Alec Weiss that because current genetic testing is not perfect, it is possible there may be a gene mutation in one of these genes that current testing cannot detect, but that chance is small.  We also discussed, that there could be another gene that has not yet been discovered, or that we have not yet tested, that is responsible for the cancer diagnoses in the family. It is also possible there is a hereditary cause for the cancer in the family that Alec Weiss did not inherit and  therefore was not identified in his testing.  Therefore, it is important to remain in touch with cancer genetics in the future so that we can continue to offer Alec Weiss the most up to date genetic testing.    ADDITIONAL GENETIC TESTING:  We discussed with Alec Weiss that his genetic testing was fairly extensive.  If there are genes identified to increase cancer risk that can be analyzed in the future, we would be happy to discuss and coordinate this testing at that time.    CANCER SCREENING RECOMMENDATIONS: Besides the possibly mosaic pathogenic variant in NF1, Alec Weiss test result is considered negative (normal).  This means that we have not identified a hereditary cause for his personal history of cancer at this time. Most cancers happen by chance, and this negative test suggests that his cancer may fall into this category.    This does not definitively rule out a hereditary predisposition to cancer. It is still possible that there could be genetic mutations that are undetectable by current technology. There could be genetic mutations in genes that have not been tested or identified to increase cancer risk.  Therefore, it is recommended he continue to follow the cancer management and screening guidelines provided by his oncology and primary healthcare provider.   An individual's cancer risk and medical management are not determined by genetic test results alone. Overall cancer  risk assessment incorporates additional factors, including personal medical history, family history, and any available genetic information that may result in a personalized plan for cancer prevention and surveillance  RECOMMENDATIONS FOR FAMILY MEMBERS:  Individuals in this family might be at some increased risk of developing cancer, over the general population risk, simply due to the family history of cancer.  We recommended women in this family have a yearly mammogram beginning at age 84, or 27 years younger than the  earliest onset of cancer, an annual clinical breast exam, and perform monthly breast self-exams. Women in this family should also have a gynecological exam as recommended by their primary provider.  First degree relatives of those with colorectal cancer should receive colonoscopies beginning at age 16, or 10 years prior to the earliest diagnosis of colon cancer in the family, and receive colonoscopies at least every 5 years, or as recommended by their gastroenterologist.    The chances of Alec Weiss children having neurofibromatosis is low given that no signs or symptoms of this condition was reported.  However, genetic testing is available to Alec Weiss's son and daughter if they are interesting in ruling out NF1.   FOLLOW-UP: Lastly, we discussed with Alec Weiss that cancer genetics is a rapidly advancing field and it is possible that new genetic tests will be appropriate for him and/or his family members in the future. We encouraged him to remain in contact with cancer genetics on an annual basis so we can update his personal and family histories and let him know of advances in cancer genetics that may benefit this family.   Our contact number was provided. Alec Weiss questions were answered to his satisfaction, and he knows he is welcome to call us at anytime with additional questions or concerns.     Alec Mcmackin M. Alec Weiss, [place], [facility] Genetic Counselor [email or web address] (P) [phone]

## 2020-12-07 ENCOUNTER — Inpatient Hospital Stay: Payer: Medicare PPO

## 2020-12-10 ENCOUNTER — Ambulatory Visit: Payer: Medicare PPO

## 2020-12-10 ENCOUNTER — Ambulatory Visit: Payer: Medicare PPO | Admitting: Hematology and Oncology

## 2020-12-10 ENCOUNTER — Other Ambulatory Visit: Payer: Medicare PPO

## 2020-12-17 ENCOUNTER — Inpatient Hospital Stay: Payer: Medicare PPO | Attending: Hematology and Oncology

## 2020-12-17 ENCOUNTER — Other Ambulatory Visit: Payer: Self-pay

## 2020-12-17 DIAGNOSIS — C20 Malignant neoplasm of rectum: Secondary | ICD-10-CM | POA: Diagnosis present

## 2020-12-17 DIAGNOSIS — Z452 Encounter for adjustment and management of vascular access device: Secondary | ICD-10-CM | POA: Diagnosis not present

## 2020-12-17 DIAGNOSIS — Z95828 Presence of other vascular implants and grafts: Secondary | ICD-10-CM

## 2020-12-17 MED ORDER — HEPARIN SOD (PORK) LOCK FLUSH 100 UNIT/ML IV SOLN
500.0000 [IU] | INTRAVENOUS | Status: AC | PRN
  Administered 2020-12-17: 500 [IU]

## 2020-12-17 MED ORDER — SODIUM CHLORIDE 0.9% FLUSH
10.0000 mL | INTRAVENOUS | Status: AC | PRN
  Administered 2020-12-17: 10 mL

## 2020-12-28 ENCOUNTER — Encounter: Payer: Self-pay | Admitting: Adult Health

## 2020-12-28 ENCOUNTER — Non-Acute Institutional Stay (SKILLED_NURSING_FACILITY): Payer: Medicare PPO | Admitting: Adult Health

## 2020-12-28 DIAGNOSIS — R195 Other fecal abnormalities: Secondary | ICD-10-CM | POA: Diagnosis not present

## 2020-12-28 DIAGNOSIS — D500 Iron deficiency anemia secondary to blood loss (chronic): Secondary | ICD-10-CM

## 2020-12-28 DIAGNOSIS — I350 Nonrheumatic aortic (valve) stenosis: Secondary | ICD-10-CM

## 2020-12-28 DIAGNOSIS — C20 Malignant neoplasm of rectum: Secondary | ICD-10-CM | POA: Diagnosis not present

## 2020-12-28 DIAGNOSIS — E871 Hypo-osmolality and hyponatremia: Secondary | ICD-10-CM | POA: Diagnosis not present

## 2020-12-28 DIAGNOSIS — R4189 Other symptoms and signs involving cognitive functions and awareness: Secondary | ICD-10-CM

## 2020-12-28 NOTE — Progress Notes (Signed)
Location:   [facility] Room Number: 161 Place of Service:  SNF [phone]) Provider:  Royal Hawthorn, NP  [name], MD  Patient Care Team: [name], MD as PCP - General (Internal Medicine) [facility] [name], MD as Consulting Physician (Ophthalmology) Benay Pike, MD as Consulting Physician (Hematology and Oncology)  Extended Emergency Contact Information Primary Emergency Contact: [place], [place] [address] Johnnette Litter of [place] Phone: [phone] Mobile Phone: [phone] Relation: Daughter  Code Status:  DNR Goals of care: Advanced Directive information Advanced Directives 12/28/2020  Does Patient Have a Medical Advance Directive? Yes  Type of Paramedic of [place];Living will;Out of facility DNR (pink MOST or yellow form)  Does patient want to make changes to medical advance directive? No - Patient declined  Copy of [place] in Chart? Yes - validated most recent copy scanned in chart (See row information)  Would patient like information on creating a medical advance directive? -  Pre-existing out of facility DNR order (yellow form or pink MOST form) Yellow form placed in chart (order not valid for inpatient use)     Chief Complaint  Patient presents with   Medical Management of Chronic Issues   Quality Metric Gaps    Shingrix, #4 Covid    HPI:  Pt is a 87 y.o. male seen today for medical management of chronic diseases.   PMH significant for iron def anemia due to chronic blood loss and adenocarcinoma of the rectum. He has received 2 cycles of chemotherapy with 5-fluorouracil and Opdivo but decided to hold off on further treatment due to weakness and diarrhea.  He has moved to skilled care and seems to be adjusting well.  He is ambulatory with a walker.  He has had a period of weight loss over the past year but is  stable over the past month. He reports he has an appetite and goes in the dining room to eat.  Wt Readings from Last 3 Encounters:  12/28/20 137 lb 9.6 oz (62.4 kg)  11/23/20 136 lb 6.4 oz (61.9 kg)  11/12/20 138 lb 9.6 oz (62.9 kg)   Last month he was acutely ill and there was concern for end of life as he was more confused, sob, and weak. He declined any intervention as his goals of care are comfort based. Since that time he has improved. He was taken off his iron for anemia and questran . Has not needed morphine recently. Declined hospice care.   He denies any abd or rectal pain. No reports of bloody stools or diarrhea. [facility] records show he is having 1-2 BMs per day He has had some irritability and mood swings. Has some noted cognitive decline MMSE 22/30 10/30/20  Past Medical History:  Diagnosis Date   Actinic keratosis 04/05/2012   Insomnia, unspecified 06/08/2008   Lumbago 01/02/2003   Neoplasm of uncertain behavior of skin 04/05/2012   Pain in limb 02/05/2011   Retinal detachment 2000   left eye   Screening cholesterol level    Shortness of breath 09/07/2008   Vitamin D deficiency 08/04/2012   Past Surgical History:  Procedure Laterality Date   BIOPSY  08/23/2020   Procedure: BIOPSY;  Surgeon: Doran Stabler, MD;  Location: [facility];  Service: Gastroenterology;;   COLONOSCOPY WITH PROPOFOL N/A 08/23/2020   Procedure: COLONOSCOPY WITH PROPOFOL;  Surgeon: Doran Stabler, MD;  Location: [facility];  Service: Gastroenterology;  Laterality: N/A;   ESOPHAGOGASTRODUODENOSCOPY (EGD) WITH PROPOFOL N/A 08/23/2020   Procedure: ESOPHAGOGASTRODUODENOSCOPY (EGD) WITH PROPOFOL;  Surgeon: Doran Stabler, MD;  Location: [facility];  Service: Gastroenterology;  Laterality: N/A;   EXTERNAL EAR SURGERY  05/2012   left ear    INGUINAL HERNIA REPAIR Right 01/21/2005   Dr. [name]   IR IMAGING GUIDED PORT INSERTION  10/09/2020   LID LESION EXCISION  2009   MOHS SURGERY Left 05/28/2012   ear  lobe Dr. [name]   RETINAL DETACHMENT SURGERY Left 1997   [facility]   SUBMUCOSAL INJECTION  08/23/2020   Procedure: SUBMUCOSAL INJECTION;  Surgeon: Doran Stabler, MD;  Location: [facility];  Service: Gastroenterology;;    Allergies  Allergen Reactions   Benzalkonium Chloride Rash    Very allergic per patient    Maxitrol [Neomycin-Polymyxin-Dexameth] Itching and Rash   Neosporin [Neomycin-Bacitracin Zn-Polymyx] Itching and Rash    Allergies as of 12/28/2020       Reactions   Benzalkonium Chloride Rash   Very allergic per patient    Maxitrol [neomycin-polymyxin-dexameth] Itching, Rash   Neosporin [neomycin-bacitracin Zn-polymyx] Itching, Rash        Medication List        Accurate as of December 28, 2020 10:42 AM. If you have any questions, ask your nurse or doctor.          atropine 1 % ophthalmic solution Place 4 drops under the tongue every 6 (six) hours as needed. Prn secretions   bisacodyl 10 MG suppository Commonly known as: DULCOLAX Place 10 mg rectally as needed for moderate constipation.   loperamide 2 MG tablet Commonly known as: IMODIUM A-D Take 2 mg by mouth 2 (two) times daily as needed for diarrhea or loose stools.   LORazepam 0.5 MG tablet Commonly known as: ATIVAN Take 1 tablet (0.5 mg total) by mouth every 4 (four) hours as needed for anxiety.   melatonin 3 MG Tabs tablet Take 3 mg by mouth at bedtime.   morphine 20 MG/ML concentrated solution Commonly known as: ROXANOL Take 0.25 mLs (5 mg total) by mouth every 4 (four) hours as needed for severe pain.   ondansetron 8 MG tablet Commonly known as: Zofran Take 1 tablet (8 mg total) by mouth 2 (two) times daily as needed for refractory nausea / vomiting. Start on day 3 after chemotherapy.   prochlorperazine 10 MG tablet Commonly known as: COMPAZINE Take 1 tablet (10 mg total) by mouth every 6 (six) hours as needed (Nausea or vomiting).   Systane 0.4-0.3 % Soln Generic drug:  Polyethyl Glycol-Propyl Glycol Apply 2 drops to eye 3 (three) times daily as needed.        Review of Systems  Constitutional:  Negative for activity change, appetite change, chills, diaphoresis, fatigue, fever and unexpected weight change.  Respiratory:  Negative for cough, shortness of breath, wheezing and stridor.   Cardiovascular:  Negative for chest pain, palpitations and leg swelling.  Gastrointestinal:  Negative for abdominal distention, abdominal pain, anal bleeding, blood in stool, constipation and diarrhea.  Genitourinary:  Negative for difficulty urinating and dysuria.  Musculoskeletal:  Positive for gait problem. Negative for arthralgias, back pain, joint swelling and myalgias.  Neurological:  Negative for dizziness, seizures, syncope, facial asymmetry, speech difficulty, weakness and headaches.  Hematological:  Negative for adenopathy. Does not bruise/bleed easily.  Psychiatric/Behavioral:  Positive for confusion. Negative for agitation, behavioral problems and sleep disturbance.    Immunization  History  Administered Date(s) Administered   Hep A / Hep B 07/21/2001, 08/18/2001   Hepatitis A, Ped/Adol-2 Dose 01/24/2002   IPV 08/18/2001   Influenza Whole 12/02/2011, 12/31/2012   Influenza, High Dose Seasonal PF 12/10/2017, 12/10/2018, 12/30/2019   Influenza,inj,Quad PF,6+ Mos 12/20/2013   Influenza-Unspecified 12/21/2014, 12/27/2015, 12/22/2016, 12/10/2018, 12/05/2020   Moderna Sars-Covid-2 Vaccination 03/25/2019, 04/12/2019, 01/17/2020   Pneumococcal Conjugate-13 11/22/2014   Pneumococcal Polysaccharide-23 07/21/2001, 12/03/2016   Td 04/12/2009   Tdap 12/10/2017, 05/07/2020   Typhoid Inactivated 07/21/2001, 04/12/2009   Zoster, Live 02/01/2011   Pertinent  Health Maintenance Due  Topic Date Due   INFLUENZA VACCINE  Completed   Fall Risk 10/05/2020 10/09/2020 10/10/2020 10/23/2020 10/23/2020  Falls in the past year? - - - - -  Number of falls in past year - - - - -  Was  there an injury with Fall? - - - - -  Was there an injury with Fall? - - - - -  Fall Risk Category Calculator - - - - -  Fall Risk Category - - - - -  Patient Fall Risk Level High fall risk High fall risk High fall risk High fall risk High fall risk  Fall risk Follow up - - - - -   Functional Status Survey:    Vitals:   12/28/20 1033  BP: (!) 154/71  Pulse: 80  Resp: 18  Temp: 97.9 F (36.6 C)  SpO2: 96%  Weight: 137 lb 9.6 oz (62.4 kg)  Height: 5\' 8"  (1.727 m)   Body mass index is 20.92 kg/m. Physical Exam Vitals and nursing note reviewed.  Constitutional:      General: He is not in acute distress.    Appearance: He is not diaphoretic.  HENT:     Head: Normocephalic and atraumatic.     Mouth/Throat:     Mouth: Mucous membranes are moist.     Pharynx: Oropharynx is clear. No oropharyngeal exudate.  Eyes:     Conjunctiva/sclera: Conjunctivae normal.     Pupils: Pupils are equal, round, and reactive to light.  Neck:     Thyroid: No thyromegaly.     Vascular: No JVD.     Trachea: No tracheal deviation.  Cardiovascular:     Rate and Rhythm: Normal rate and regular rhythm.     Heart sounds: Murmur heard.  Pulmonary:     Effort: Pulmonary effort is normal. No respiratory distress.     Breath sounds: Normal breath sounds. No wheezing.  Abdominal:     General: Bowel sounds are normal. There is no distension.     Palpations: Abdomen is soft.     Tenderness: There is no abdominal tenderness.  Musculoskeletal:        General: No swelling, tenderness, deformity or signs of injury.     Comments: Trace edema bilaterally  Lymphadenopathy:     Cervical: No cervical adenopathy.  Skin:    General: Skin is warm and dry.  Neurological:     Mental Status: He is alert.     Cranial Nerves: No cranial nerve deficit.     Comments: Oriented to self place but not time. Forgetful of the details of his care.  Psychiatric:        Mood and Affect: Mood normal.    Labs  reviewed: Recent Labs    10/03/20 1207 10/10/20 0943 10/23/20 0858 11/01/20 0000 11/15/20 0000 11/22/20 0000 11/30/20 0000  NA 132* 133* 126*   < > 128* 126* 131*  K 4.5 4.4 4.4   < > 4.6 4.7 4.5  CL 100 100 92*   < > 90* 92* 95*  CO2 25 25 26    < > 29* 26* 23*  GLUCOSE 112* 110* 106*  --   --   --   --   BUN 13 15 13    < > 14 17 18   CREATININE 0.86 0.76 0.79   < > 0.7 0.6 0.7  CALCIUM 9.1 9.3 9.0   < > 9.4 9.0 9.7   < > = values in this interval not displayed.   Recent Labs    10/03/20 1207 10/10/20 0943 10/23/20 0858 11/01/20 0000  AST 15 16 21 17   ALT 12 12 17 21   ALKPHOS 74 79 79 89  BILITOT 0.3 0.6 0.4  --   PROT 6.4* 6.5 6.2*  --   ALBUMIN 3.3* 3.3* 3.3* 3.6   Recent Labs    10/03/20 1207 10/09/20 1330 10/10/20 0943 10/23/20 0858 11/01/20 0000 11/08/20 0000  WBC 7.3 7.6 6.8 4.6 6.5 3.8  NEUTROABS 5.5  --  5.0 3.2  --   --   HGB 7.3* 10.6* 9.9* 9.2* 10.1* 9.6*  HCT 23.2* 34.2* 31.2* 27.3* 30* 28*  MCV 78.4* 84.0 83.0 80.8  --   --   PLT 354 357 302 281 369 185   Lab Results  Component Value Date   TSH 1.495 10/23/2020   Lab Results  Component Value Date   HGBA1C 5.9 03/09/2012   Lab Results  Component Value Date   CHOL 189 12/16/2018   HDL 69 12/16/2018   LDLCALC 111 12/16/2018   TRIG 45 12/16/2018    Significant Diagnostic Results in last 30 days:  No results found.  Assessment/Plan  1. Adenocarcinoma of rectum [facility]) S/p chemo Aims to avoid further treatment at this time and focus on comfort No current issues with bleeding or pain  2. Hyponatremia Chronic, will recheck  3. Iron deficiency anemia due to chronic blood loss Not currently on iron as last month he was appearing to be end of life but has bounced back. Will recheck CBC  4. Loose stools Not an issue right now, if worsening could add back questran and imodium  5. Cognitive impairment Noted, appropriate for skilled care.   6. Mild aortic stenosis by prior  echocardiogram Noted on exam, no current issues.   Family/ staff Communication:  nurse Labs/tests ordered:  CBC BMP

## 2020-12-29 ENCOUNTER — Encounter: Payer: Self-pay | Admitting: Adult Health

## 2020-12-31 LAB — BASIC METABOLIC PANEL
BUN: 15 (ref 4–21)
CO2: 25 — AB (ref 13–22)
Chloride: 98 — AB (ref 99–108)
Creatinine: 0.6 (ref 0.6–1.3)
Glucose: 89
Potassium: 4.4 (ref 3.4–5.3)
Sodium: 133 — AB (ref 137–147)

## 2020-12-31 LAB — COMPREHENSIVE METABOLIC PANEL: Calcium: 8.9 (ref 8.7–10.7)

## 2020-12-31 LAB — CBC: RBC: 3.49 — AB (ref 3.87–5.11)

## 2020-12-31 LAB — CBC AND DIFFERENTIAL
HCT: 31 — AB (ref 41–53)
Hemoglobin: 10.3 — AB (ref 13.5–17.5)
Platelets: 320 (ref 150–399)
WBC: 5

## 2021-01-06 ENCOUNTER — Encounter: Payer: Self-pay | Admitting: Hematology and Oncology

## 2021-01-08 ENCOUNTER — Encounter: Payer: Self-pay | Admitting: Radiation Oncology

## 2021-01-09 ENCOUNTER — Encounter: Payer: Self-pay | Admitting: Hematology and Oncology

## 2021-01-10 ENCOUNTER — Ambulatory Visit: Payer: Medicare PPO | Admitting: Radiation Oncology

## 2021-01-10 ENCOUNTER — Ambulatory Visit
Admission: RE | Admit: 2021-01-10 | Discharge: 2021-01-10 | Disposition: A | Discharge status: Home / Self Care | Payer: Medicare PPO | Source: Ambulatory Visit | Attending: Radiation Oncology | Admitting: Radiation Oncology

## 2021-01-10 ENCOUNTER — Ambulatory Visit: Payer: Medicare PPO

## 2021-01-10 NOTE — Progress Notes (Signed)
Alec Weiss call stating that she received a call stating that her father had a ct sm appointment this morning . Alec Weiss states that she tried to cancel the appointment because her father will not be receiving treatment here.

## 2021-01-11 ENCOUNTER — Telehealth: Payer: Self-pay | Admitting: Genetic Counselor

## 2021-01-11 NOTE — Telephone Encounter (Signed)
Called daughter [name] to discuss insurance coverage denial for genetic testing.  Explained that father met NCCN guidelines for testing but not insurance.  Discussed that I do not expect him to receive bill for genetic testing despite denial given that he has Medicare (confirmed by [employer] team).  Contact information provided in case additional questions arise.

## 2021-01-16 ENCOUNTER — Encounter: Payer: Medicare PPO | Admitting: Internal Medicine

## 2021-01-16 ENCOUNTER — Encounter: Payer: Self-pay | Admitting: Internal Medicine

## 2021-01-21 ENCOUNTER — Non-Acute Institutional Stay (SKILLED_NURSING_FACILITY): Payer: Medicare PPO | Admitting: Internal Medicine

## 2021-01-21 ENCOUNTER — Encounter: Payer: Self-pay | Admitting: Internal Medicine

## 2021-01-21 DIAGNOSIS — R4189 Other symptoms and signs involving cognitive functions and awareness: Secondary | ICD-10-CM | POA: Diagnosis not present

## 2021-01-21 DIAGNOSIS — D500 Iron deficiency anemia secondary to blood loss (chronic): Secondary | ICD-10-CM

## 2021-01-21 DIAGNOSIS — C20 Malignant neoplasm of rectum: Secondary | ICD-10-CM | POA: Diagnosis not present

## 2021-01-21 DIAGNOSIS — R0602 Shortness of breath: Secondary | ICD-10-CM

## 2021-01-21 DIAGNOSIS — R195 Other fecal abnormalities: Secondary | ICD-10-CM

## 2021-01-21 NOTE — Progress Notes (Signed)
Location:   [facility] Room Number: [place] of Service:  SNF [phone]) Provider:  Veleta Miners MD   [name], MD  Patient Care Team: [name], MD as PCP - General (Internal Medicine) Community, Well [name], MD as Consulting Physician (Ophthalmology) Benay Pike, MD as Consulting Physician (Hematology and Oncology)  Extended Emergency Contact Information Primary Emergency Contact: [place], [place] [address] Johnnette Litter of [place] Phone: [phone] Mobile Phone: [phone] Relation: Daughter  Code Status:  DNR Goals of care: Advanced Directive information Advanced Directives 01/21/2021  Does Patient Have a Medical Advance Directive? Yes  Type of Paramedic of [place];Living will;Out of facility DNR (pink MOST or yellow form)  Does patient want to make changes to medical advance directive? No - Patient declined  Copy of [place] in Chart? Yes - validated most recent copy scanned in chart (See row information)  Would patient like information on creating a medical advance directive? -  Pre-existing out of facility DNR order (yellow form or pink MOST form) Yellow form placed in chart (order not valid for inpatient use)     Chief Complaint  Patient presents with   Medical Management of Chronic Issues   Quality Metric Gaps    Zoster Vaccines- Shingrix (1 of 2)    HPI:  Pt is a 87 y.o. male seen today for medical management of chronic diseases.    Patient with recent diagnosis of adenocarcinoma of rectum . Did not tolerate Chemo and Decided to not pursue any treatment Was taken off all meds Since then he is doing well Walking with his walker Does have loose stool but not getting up every few hours  Appetite is good Weight is table Cogniton does look worse. And had some behavior issues with nurses and his care     Past  Medical History:  Diagnosis Date   Actinic keratosis 04/05/2012   Insomnia, unspecified 06/08/2008   Lumbago 01/02/2003   Neoplasm of uncertain behavior of skin 04/05/2012   Pain in limb 02/05/2011   Retinal detachment 2000   left eye   Screening cholesterol level    Shortness of breath 09/07/2008   Vitamin D deficiency 08/04/2012   Past Surgical History:  Procedure Laterality Date   BIOPSY  08/23/2020   Procedure: BIOPSY;  Surgeon: Doran Stabler, MD;  Location: [facility];  Service: Gastroenterology;;   COLONOSCOPY WITH PROPOFOL N/A 08/23/2020   Procedure: COLONOSCOPY WITH PROPOFOL;  Surgeon: Doran Stabler, MD;  Location: [facility];  Service: Gastroenterology;  Laterality: N/A;   ESOPHAGOGASTRODUODENOSCOPY (EGD) WITH PROPOFOL N/A 08/23/2020   Procedure: ESOPHAGOGASTRODUODENOSCOPY (EGD) WITH PROPOFOL;  Surgeon: Doran Stabler, MD;  Location: [facility];  Service: Gastroenterology;  Laterality: N/A;   EXTERNAL EAR SURGERY  05/2012   left ear    INGUINAL HERNIA REPAIR Right 01/21/2005   Dr. [name]   IR IMAGING GUIDED PORT INSERTION  10/09/2020   LID LESION EXCISION  2009   MOHS SURGERY Left 05/28/2012   ear lobe Dr. [name]   RETINAL DETACHMENT SURGERY Left 1997   [facility]   SUBMUCOSAL INJECTION  08/23/2020   Procedure: SUBMUCOSAL INJECTION;  Surgeon: Doran Stabler, MD;  Location: [facility];  Service: Gastroenterology;;    Allergies  Allergen Reactions   Benzalkonium Chloride Rash    Very allergic per patient    Maxitrol [Neomycin-Polymyxin-Dexameth]  Itching and Rash   Neosporin [Neomycin-Bacitracin Zn-Polymyx] Itching and Rash    Allergies as of 01/21/2021       Reactions   Benzalkonium Chloride Rash   Very allergic per patient    Maxitrol [neomycin-polymyxin-dexameth] Itching, Rash   Neosporin [neomycin-bacitracin Zn-polymyx] Itching, Rash        Medication List        Accurate as of January 21, 2021  2:05 PM. If you have any questions, ask your  nurse or doctor.          atropine 1 % ophthalmic solution Place 4 drops under the tongue every 6 (six) hours as needed. Prn secretions   bisacodyl 10 MG suppository Commonly known as: DULCOLAX Place 10 mg rectally as needed for moderate constipation.   loperamide 2 MG tablet Commonly known as: IMODIUM A-D Take 2 mg by mouth 2 (two) times daily as needed for diarrhea or loose stools.   LORazepam 0.5 MG tablet Commonly known as: ATIVAN Take 1 tablet (0.5 mg total) by mouth every 4 (four) hours as needed for anxiety.   melatonin 3 MG Tabs tablet Take 3 mg by mouth at bedtime.   morphine 20 MG/ML concentrated solution Commonly known as: ROXANOL Take 0.25 mLs (5 mg total) by mouth every 4 (four) hours as needed for severe pain.   ondansetron 8 MG tablet Commonly known as: Zofran Take 1 tablet (8 mg total) by mouth 2 (two) times daily as needed for refractory nausea / vomiting. Start on day 3 after chemotherapy.   prochlorperazine 10 MG tablet Commonly known as: COMPAZINE Take 1 tablet (10 mg total) by mouth every 6 (six) hours as needed (Nausea or vomiting).   Systane 0.4-0.3 % Soln Generic drug: Polyethyl Glycol-Propyl Glycol Apply 2 drops to eye 3 (three) times daily as needed.        Review of Systems Review of Systems  Constitutional: Negative for activity change, appetite change, chills, diaphoresis, fatigue and fever.  HENT: Negative for mouth sores, postnasal drip, rhinorrhea, sinus pain and sore throat.   Respiratory: Negative for apnea, cough, chest tightness, shortness of breath and wheezing.   Cardiovascular: Negative for chest pain, palpitations and leg swelling.  Gastrointestinal: Negative for abdominal distention, abdominal pain, constipation, diarrhea, nausea and vomiting.  Genitourinary: Negative for dysuria and frequency.  Musculoskeletal: Negative for arthralgias, joint swelling and myalgias.  Skin: Negative for rash.  Neurological: Negative for  dizziness, syncope, weakness, light-headedness and numbness.  Psychiatric/Behavioral: Negative for behavioral problems, confusion and sleep disturbance.    Immunization History  Administered Date(s) Administered   Hep A / Hep B 07/21/2001, 08/18/2001   Hepatitis A, Ped/Adol-2 Dose 01/24/2002   IPV 08/18/2001   Influenza Whole 12/02/2011, 12/31/2012   Influenza, High Dose Seasonal PF 12/10/2017, 12/10/2018, 12/30/2019   Influenza,inj,Quad PF,6+ Mos 12/20/2013   Influenza-Unspecified 12/21/2014, 12/27/2015, 12/22/2016, 12/10/2018, 12/05/2020   Moderna SARS-COV2 Booster Vaccination 12/12/2020   Moderna Sars-Covid-2 Vaccination 03/25/2019, 04/12/2019, 01/17/2020   Pneumococcal Conjugate-13 11/22/2014   Pneumococcal Polysaccharide-23 07/21/2001, 12/03/2016   Td 04/12/2009   Tdap 12/10/2017, 05/07/2020   Typhoid Inactivated 07/21/2001, 04/12/2009   Zoster, Live 02/01/2011   Pertinent  Health Maintenance Due  Topic Date Due   INFLUENZA VACCINE  Completed   Fall Risk 10/05/2020 10/09/2020 10/10/2020 10/23/2020 10/23/2020  Falls in the past year? - - - - -  Number of falls in past year - - - - -  Was there an injury with Fall? - - - - -  Was  there an injury with Fall? - - - - -  Fall Risk Category Calculator - - - - -  Fall Risk Category - - - - -  Patient Fall Risk Level High fall risk High fall risk High fall risk High fall risk High fall risk  Fall risk Follow up - - - - -   Functional Status Survey:    Vitals:   01/21/21 1019  BP: (!) 147/78  Pulse: 74  Resp: 18  Temp: (!) 97.5 F (36.4 C)  SpO2: 97%  Weight: 139 lb 1.6 oz (63.1 kg)  Height: 5\' 8"  (1.727 m)   Body mass index is 21.15 kg/m. Physical Exam Constitutional:  Well-developed and well-nourished.  HENT:  Head: Normocephalic.  Mouth/Throat: Oropharynx is clear and moist.  Eyes: Pupils are equal, round, and reactive to light.  Neck: Neck supple.  Cardiovascular: Normal rate and normal heart sounds.  Murmur  Present Pulmonary/Chest: Effort normal and breath sounds normal. No respiratory distress. No wheezes. She has no rales.  Abdominal: Soft. Bowel sounds are normal. No distension. There is no tenderness. There is no rebound.  Musculoskeletal: No edema.  Lymphadenopathy: none Neurological: Alert No Focal deficits Skin: Skin is warm and dry.  Psychiatric: Normal mood and affect. Behavior is normal. Thought content normal.   Labs reviewed: Recent Labs    10/03/20 1207 10/10/20 0943 10/23/20 0858 11/01/20 0000 11/22/20 0000 11/30/20 0000 12/31/20 0000  NA 132* 133* 126*   < > 126* 131* 133*  K 4.5 4.4 4.4   < > 4.7 4.5 4.4  CL 100 100 92*   < > 92* 95* 98*  CO2 25 25 26    < > 26* 23* 25*  GLUCOSE 112* 110* 106*  --   --   --   --   BUN 13 15 13    < > 17 18 15   CREATININE 0.86 0.76 0.79   < > 0.6 0.7 0.6  CALCIUM 9.1 9.3 9.0   < > 9.0 9.7 8.9   < > = values in this interval not displayed.    Recent Labs    10/03/20 1207 10/10/20 0943 10/23/20 0858 11/01/20 0000  AST 15 16 21 17   ALT 12 12 17 21   ALKPHOS 74 79 79 89  BILITOT 0.3 0.6 0.4  --   PROT 6.4* 6.5 6.2*  --   ALBUMIN 3.3* 3.3* 3.3* 3.6    Recent Labs    10/03/20 1207 10/09/20 1330 10/10/20 0943 10/23/20 0858 11/01/20 0000 11/08/20 0000 12/31/20 0000  WBC 7.3 7.6 6.8 4.6 6.5 3.8 5.0  NEUTROABS 5.5  --  5.0 3.2  --   --   --   HGB 7.3* 10.6* 9.9* 9.2* 10.1* 9.6* 10.3*  HCT 23.2* 34.2* 31.2* 27.3* 30* 28* 31*  MCV 78.4* 84.0 83.0 80.8  --   --   --   PLT 354 357 302 281 369 185 320    Lab Results  Component Value Date   TSH 1.495 10/23/2020   Lab Results  Component Value Date   HGBA1C 5.9 03/09/2012   Lab Results  Component Value Date   CHOL 189 12/16/2018   HDL 69 12/16/2018   LDLCALC 111 12/16/2018   TRIG 45 12/16/2018    Significant Diagnostic Results in last 30 days:  No results found.  Assessment/Plan Adenocarcinoma of rectum ([place]) No More treatment per patients request He is not  having any bleeding On Roxanol and Ativan prn Does not want hospice  right now Cognitive impairment Significant decline but continues to walk and do some of his ADLS  Loose stools Less in Frequency now Continue Symptomatic Immodium  Iron deficiency anemia due to chronic blood loss Off iron now      Family/ staff Communication:   Labs/tests ordered:

## 2021-01-21 NOTE — Progress Notes (Signed)
Location:   [facility] Room Number: [place] of Service:  SNF [phone]) Provider:  Veleta Miners MD   [name], MD  Patient Care Team: [name], MD as PCP - General (Internal Medicine) Community, Well [name], MD as Consulting Physician (Ophthalmology) Benay Pike, MD as Consulting Physician (Hematology and Oncology)  Extended Emergency Contact Information Primary Emergency Contact: [place], [place] [address] Johnnette Litter of [place] Phone: [phone] Mobile Phone: [phone] Relation: Daughter  Code Status:  DNR Goals of care: Advanced Directive information Advanced Directives 01/21/2021  Does Patient Have a Medical Advance Directive? Yes  Type of Paramedic of [place];Living will;Out of facility DNR (pink MOST or yellow form)  Does patient want to make changes to medical advance directive? No - Patient declined  Copy of [place] in Chart? Yes - validated most recent copy scanned in chart (See row information)  Would patient like information on creating a medical advance directive? -  Pre-existing out of facility DNR order (yellow form or pink MOST form) Yellow form placed in chart (order not valid for inpatient use)     Chief Complaint  Patient presents with   Medical Management of Chronic Issues   Quality Metric Gaps    Zoster Vaccines- Shingrix (1 of 2)    HPI:  Pt is a 87 y.o. male seen today for medical management of chronic diseases.    Patient with recent diagnosis of adenocarcinoma of rectum . Did not tolerate Chemo and Decided to not pursue any treatment Was taken off all meds Since then he is doing well Walking with his wlaker Does have loose stool but not getting up every few hours  Appetite is good Weigh tis table Cogniton doe slook worse. And had somebehavior issues with nurses and his care     Past  Medical History:  Diagnosis Date   Actinic keratosis 04/05/2012   Insomnia, unspecified 06/08/2008   Lumbago 01/02/2003   Neoplasm of uncertain behavior of skin 04/05/2012   Pain in limb 02/05/2011   Retinal detachment 2000   left eye   Screening cholesterol level    Shortness of breath 09/07/2008   Vitamin D deficiency 08/04/2012   Past Surgical History:  Procedure Laterality Date   BIOPSY  08/23/2020   Procedure: BIOPSY;  Surgeon: Doran Stabler, MD;  Location: [facility];  Service: Gastroenterology;;   COLONOSCOPY WITH PROPOFOL N/A 08/23/2020   Procedure: COLONOSCOPY WITH PROPOFOL;  Surgeon: Doran Stabler, MD;  Location: [facility];  Service: Gastroenterology;  Laterality: N/A;   ESOPHAGOGASTRODUODENOSCOPY (EGD) WITH PROPOFOL N/A 08/23/2020   Procedure: ESOPHAGOGASTRODUODENOSCOPY (EGD) WITH PROPOFOL;  Surgeon: Doran Stabler, MD;  Location: [facility];  Service: Gastroenterology;  Laterality: N/A;   EXTERNAL EAR SURGERY  05/2012   left ear    INGUINAL HERNIA REPAIR Right 01/21/2005   Dr. [name]   IR IMAGING GUIDED PORT INSERTION  10/09/2020   LID LESION EXCISION  2009   MOHS SURGERY Left 05/28/2012   ear lobe Dr. [name]   RETINAL DETACHMENT SURGERY Left 1997   [facility]   SUBMUCOSAL INJECTION  08/23/2020   Procedure: SUBMUCOSAL INJECTION;  Surgeon: Doran Stabler, MD;  [facility]: [facility];  Service: Gastroenterology;;    Allergies  Allergen Reactions   Benzalkonium Chloride Rash    Very allergic per patient    Maxitrol [Neomycin-Polymyxin-Dexameth] Itching  and Rash   Neosporin [Neomycin-Bacitracin Zn-Polymyx] Itching and Rash    Allergies as of 01/21/2021       Reactions   Benzalkonium Chloride Rash   Very allergic per patient    Maxitrol [neomycin-polymyxin-dexameth] Itching, Rash   Neosporin [neomycin-bacitracin Zn-polymyx] Itching, Rash        Medication List        Accurate as of January 21, 2021 10:23 AM. If you have any questions, ask your  nurse or doctor.          atropine 1 % ophthalmic solution Place 4 drops under the tongue every 6 (six) hours as needed. Prn secretions   bisacodyl 10 MG suppository Commonly known as: DULCOLAX Place 10 mg rectally as needed for moderate constipation.   loperamide 2 MG tablet Commonly known as: IMODIUM A-D Take 2 mg by mouth 2 (two) times daily as needed for diarrhea or loose stools.   LORazepam 0.5 MG tablet Commonly known as: ATIVAN Take 1 tablet (0.5 mg total) by mouth every 4 (four) hours as needed for anxiety.   melatonin 3 MG Tabs tablet Take 3 mg by mouth at bedtime.   morphine 20 MG/ML concentrated solution Commonly known as: ROXANOL Take 0.25 mLs (5 mg total) by mouth every 4 (four) hours as needed for severe pain.   ondansetron 8 MG tablet Commonly known as: Zofran Take 1 tablet (8 mg total) by mouth 2 (two) times daily as needed for refractory nausea / vomiting. Start on day 3 after chemotherapy.   prochlorperazine 10 MG tablet Commonly known as: COMPAZINE Take 1 tablet (10 mg total) by mouth every 6 (six) hours as needed (Nausea or vomiting).   Systane 0.4-0.3 % Soln Generic drug: Polyethyl Glycol-Propyl Glycol Apply 2 drops to eye 3 (three) times daily as needed.        Review of Systems Review of Systems  Constitutional: Negative for activity change, appetite change, chills, diaphoresis, fatigue and fever.  HENT: Negative for mouth sores, postnasal drip, rhinorrhea, sinus pain and sore throat.   Respiratory: Negative for apnea, cough, chest tightness, shortness of breath and wheezing.   Cardiovascular: Negative for chest pain, palpitations and leg swelling.  Gastrointestinal: Negative for abdominal distention, abdominal pain, constipation, diarrhea, nausea and vomiting.  Genitourinary: Negative for dysuria and frequency.  Musculoskeletal: Negative for arthralgias, joint swelling and myalgias.  Skin: Negative for rash.  Neurological: Negative for  dizziness, syncope, weakness, light-headedness and numbness.  Psychiatric/Behavioral: Negative for behavioral problems, confusion and sleep disturbance.    Immunization History  Administered Date(s) Administered   Hep A / Hep B 07/21/2001, 08/18/2001   Hepatitis A, Ped/Adol-2 Dose 01/24/2002   IPV 08/18/2001   Influenza Whole 12/02/2011, 12/31/2012   Influenza, High Dose Seasonal PF 12/10/2017, 12/10/2018, 12/30/2019   Influenza,inj,Quad PF,6+ Mos 12/20/2013   Influenza-Unspecified 12/21/2014, 12/27/2015, 12/22/2016, 12/10/2018, 12/05/2020   Moderna SARS-COV2 Booster Vaccination 12/12/2020   Moderna Sars-Covid-2 Vaccination 03/25/2019, 04/12/2019, 01/17/2020   Pneumococcal Conjugate-13 11/22/2014   Pneumococcal Polysaccharide-23 07/21/2001, 12/03/2016   Td 04/12/2009   Tdap 12/10/2017, 05/07/2020   Typhoid Inactivated 07/21/2001, 04/12/2009   Zoster, Live 02/01/2011   Pertinent  Health Maintenance Due  Topic Date Due   INFLUENZA VACCINE  Completed   Fall Risk 10/05/2020 10/09/2020 10/10/2020 10/23/2020 10/23/2020  Falls in the past year? - - - - -  Number of falls in past year - - - - -  Was there an injury with Fall? - - - - -  Was there an  injury with Fall? - - - - -  Fall Risk Category Calculator - - - - -  Fall Risk Category - - - - -  Patient Fall Risk Level High fall risk High fall risk High fall risk High fall risk High fall risk  Fall risk Follow up - - - - -   Functional Status Survey:    Vitals:   01/21/21 1019  BP: (!) 147/78  Pulse: 74  Resp: 18  Temp: (!) 97.5 F (36.4 C)  SpO2: 97%  Weight: 139 lb 1.6 oz (63.1 kg)  Height: 5\' 8"  (1.727 m)   Body mass index is 21.15 kg/m. Physical Exam Constitutional:  Well-developed and well-nourished.  HENT:  Head: Normocephalic.  Mouth/Throat: Oropharynx is clear and moist.  Eyes: Pupils are equal, round, and reactive to light.  Neck: Neck supple.  Cardiovascular: Normal rate and normal heart sounds.  Murmur  Present Pulmonary/Chest: Effort normal and breath sounds normal. No respiratory distress. No wheezes. She has no rales.  Abdominal: Soft. Bowel sounds are normal. No distension. There is no tenderness. There is no rebound.  Musculoskeletal: No edema.  Lymphadenopathy: none Neurological: Alert No Focal deficits Skin: Skin is warm and dry.  Psychiatric: Normal mood and affect. Behavior is normal. Thought content normal.   Labs reviewed: Recent Labs    10/03/20 1207 10/10/20 0943 10/23/20 0858 11/01/20 0000 11/22/20 0000 11/30/20 0000 12/31/20 0000  NA 132* 133* 126*   < > 126* 131* 133*  K 4.5 4.4 4.4   < > 4.7 4.5 4.4  CL 100 100 92*   < > 92* 95* 98*  CO2 25 25 26    < > 26* 23* 25*  GLUCOSE 112* 110* 106*  --   --   --   --   BUN 13 15 13    < > 17 18 15   CREATININE 0.86 0.76 0.79   < > 0.6 0.7 0.6  CALCIUM 9.1 9.3 9.0   < > 9.0 9.7 8.9   < > = values in this interval not displayed.   Recent Labs    10/03/20 1207 10/10/20 0943 10/23/20 0858 11/01/20 0000  AST 15 16 21 17   ALT 12 12 17 21   ALKPHOS 74 79 79 89  BILITOT 0.3 0.6 0.4  --   PROT 6.4* 6.5 6.2*  --   ALBUMIN 3.3* 3.3* 3.3* 3.6   Recent Labs    10/03/20 1207 10/09/20 1330 10/10/20 0943 10/23/20 0858 11/01/20 0000 11/08/20 0000 12/31/20 0000  WBC 7.3 7.6 6.8 4.6 6.5 3.8 5.0  NEUTROABS 5.5  --  5.0 3.2  --   --   --   HGB 7.3* 10.6* 9.9* 9.2* 10.1* 9.6* 10.3*  HCT 23.2* 34.2* 31.2* 27.3* 30* 28* 31*  MCV 78.4* 84.0 83.0 80.8  --   --   --   PLT 354 357 302 281 369 185 320   Lab Results  Component Value Date   TSH 1.495 10/23/2020   Lab Results  Component Value Date   HGBA1C 5.9 03/09/2012   Lab Results  Component Value Date   CHOL 189 12/16/2018   HDL 69 12/16/2018   LDLCALC 111 12/16/2018   TRIG 45 12/16/2018    Significant Diagnostic Results in last 30 days:  No results found.  Assessment/Plan Adenocarcinoma of rectum ([facility])  Cognitive impairment  Loose stools  Iron deficiency  anemia due to chronic blood loss  SOB (shortness of breath)    Family/ staff Communication:  Labs/tests ordered:

## 2021-02-11 ENCOUNTER — Other Ambulatory Visit: Payer: Self-pay

## 2021-02-11 ENCOUNTER — Inpatient Hospital Stay: Payer: Medicare PPO | Attending: Hematology and Oncology

## 2021-02-11 DIAGNOSIS — D508 Other iron deficiency anemias: Secondary | ICD-10-CM | POA: Diagnosis not present

## 2021-02-11 DIAGNOSIS — C20 Malignant neoplasm of rectum: Secondary | ICD-10-CM | POA: Insufficient documentation

## 2021-02-11 DIAGNOSIS — D509 Iron deficiency anemia, unspecified: Secondary | ICD-10-CM

## 2021-02-11 DIAGNOSIS — Z79899 Other long term (current) drug therapy: Secondary | ICD-10-CM | POA: Diagnosis not present

## 2021-02-11 DIAGNOSIS — Z452 Encounter for adjustment and management of vascular access device: Secondary | ICD-10-CM | POA: Diagnosis present

## 2021-02-11 LAB — CMP (CANCER CENTER ONLY)
ALT: 10 U/L (ref 0–44)
AST: 11 U/L — ABNORMAL LOW (ref 15–41)
Albumin: 2.9 g/dL — ABNORMAL LOW (ref 3.5–5.0)
Alkaline Phosphatase: 81 U/L (ref 38–126)
Anion gap: 7 (ref 5–15)
BUN: 16 mg/dL (ref 8–23)
CO2: 27 mmol/L (ref 22–32)
Calcium: 8.9 mg/dL (ref 8.9–10.3)
Chloride: 98 mmol/L (ref 98–111)
Creatinine: 0.78 mg/dL (ref 0.61–1.24)
GFR, Estimated: >60 mL/min (ref >60)
Glucose, Bld: 109 mg/dL — ABNORMAL HIGH (ref 70–99)
Potassium: 4.6 mmol/L (ref 3.5–5.1)
Sodium: 132 mmol/L — ABNORMAL LOW (ref 135–145)
Total Bilirubin: 0.3 mg/dL (ref 0.3–1.2)
Total Protein: 6.6 g/dL (ref 6.5–8.1)

## 2021-02-11 LAB — CBC WITH DIFFERENTIAL (CANCER CENTER ONLY)
Abs Immature Granulocytes: 0.06 10*3/uL (ref 0.00–0.07)
Basophils Absolute: 0.1 10*3/uL (ref 0.0–0.1)
Basophils Relative: 1 %
Eosinophils Absolute: 0.1 10*3/uL (ref 0.0–0.5)
Eosinophils Relative: 1 %
HCT: 29.6 % — ABNORMAL LOW (ref 39.0–52.0)
Hemoglobin: 9.8 g/dL — ABNORMAL LOW (ref 13.0–17.0)
Immature Granulocytes: 1 %
Lymphocytes Relative: 7 %
Lymphs Abs: 0.9 10*3/uL (ref 0.7–4.0)
MCH: 29.2 pg (ref 26.0–34.0)
MCHC: 33.1 g/dL (ref 30.0–36.0)
MCV: 88.1 fL (ref 80.0–100.0)
Monocytes Absolute: 1.2 10*3/uL — ABNORMAL HIGH (ref 0.1–1.0)
Monocytes Relative: 10 %
Neutro Abs: 9.6 10*3/uL — ABNORMAL HIGH (ref 1.7–7.7)
Neutrophils Relative %: 80 %
Platelet Count: 449 10*3/uL — ABNORMAL HIGH (ref 150–400)
RBC: 3.36 MIL/uL — ABNORMAL LOW (ref 4.22–5.81)
RDW: 13.4 % (ref 11.5–15.5)
WBC Count: 11.8 10*3/uL — ABNORMAL HIGH (ref 4.0–10.5)
nRBC: 0 % (ref 0.0–0.2)

## 2021-02-11 LAB — TSH: TSH: 2.045 u[IU]/mL (ref 0.320–4.118)

## 2021-02-11 MED ORDER — HEPARIN SOD (PORK) LOCK FLUSH 100 UNIT/ML IV SOLN
500.0000 [IU] | Freq: Once | INTRAVENOUS | Status: AC | PRN
  Administered 2021-02-11: 500 [IU]

## 2021-02-11 MED ORDER — SODIUM CHLORIDE 0.9% FLUSH
10.0000 mL | Freq: Once | INTRAVENOUS | Status: AC | PRN
  Administered 2021-02-11: 10 mL

## 2021-02-11 NOTE — Patient Instructions (Signed)

## 2021-02-12 LAB — T4: T4, Total: 6.8 ug/dL (ref 4.5–12.0)

## 2021-03-01 ENCOUNTER — Encounter: Payer: Self-pay | Admitting: Adult Health

## 2021-03-01 ENCOUNTER — Non-Acute Institutional Stay (SKILLED_NURSING_FACILITY): Payer: Medicare PPO | Admitting: Adult Health

## 2021-03-01 DIAGNOSIS — C20 Malignant neoplasm of rectum: Secondary | ICD-10-CM

## 2021-03-01 DIAGNOSIS — I700 Atherosclerosis of aorta: Secondary | ICD-10-CM

## 2021-03-01 DIAGNOSIS — F5101 Primary insomnia: Secondary | ICD-10-CM

## 2021-03-01 DIAGNOSIS — D500 Iron deficiency anemia secondary to blood loss (chronic): Secondary | ICD-10-CM | POA: Diagnosis not present

## 2021-03-01 DIAGNOSIS — E871 Hypo-osmolality and hyponatremia: Secondary | ICD-10-CM

## 2021-03-01 DIAGNOSIS — I83893 Varicose veins of bilateral lower extremities with other complications: Secondary | ICD-10-CM | POA: Diagnosis not present

## 2021-03-01 DIAGNOSIS — K909 Intestinal malabsorption, unspecified: Secondary | ICD-10-CM

## 2021-03-01 DIAGNOSIS — R197 Diarrhea, unspecified: Secondary | ICD-10-CM

## 2021-03-01 NOTE — Progress Notes (Addendum)
Location:   [place] Room Number: [place] of Service:  SNF ([phone]) Provider:  Royal Hawthorn, NP  [name], MD  Patient Care Team: [name], MD as PCP - General (Internal Medicine) [facility], [facility] [name], MD as Consulting Physician (Ophthalmology) Benay Pike, MD as Consulting Physician (Hematology and Oncology)  Extended Emergency Contact Information Primary Emergency Contact: [place], [place] [address] Johnnette Litter of [place] Phone: [phone] Mobile Phone: [phone] Relation: Daughter  Code Status:  DNR Goals of care: Advanced Directive information Advanced Directives 03/01/2021  Does Patient Have a Medical Advance Directive? Yes  Type of Paramedic of [place];Living will;Out of facility DNR (pink MOST or yellow form)  Does patient want to make changes to medical advance directive? No - Patient declined  Copy of [place] in Chart? Yes - validated most recent copy scanned in chart (See row information)  Would patient like information on creating a medical advance directive? -  Pre-existing out of facility DNR order (yellow form or pink MOST form) Yellow form placed in chart (order not valid for inpatient use)     Chief Complaint  Patient presents with   Medical Management of Chronic Issues   Quality Metric Gaps    Shingrix and covid   Covid booster is up to date. Will have staff offer shingrix   HPI:  Pt is a 87 y.o. male seen today for medical management of chronic diseases.   PMH significant for iron def anemia due to chronic blood loss and adenocarcinoma of the rectum. He has received 2 cycles of chemotherapy with 5-fluorouracil and Opdivo but decided to hold off on further treatment due to weakness and diarrhea.  At this time he is doing well in the skilled care unit. He is experiencing some memory loss.   Note from heme/onc indicates he had an iron infusion on 12/12 but he does not remember it. The nurse called his daughter and she said he had not been there. However, there are lab results in epic from that day 02/11/21 He denies any issues with abd pain, nausea, or low appetite. Sodium has been stable.  He reports his stools look like mud and are loose but not frequent. He has not acute complaints.  Wt Readings from Last 3 Encounters:  03/01/21 139 lb 1.6 oz (63.1 kg)  01/21/21 139 lb 1.6 oz (63.1 kg)  12/28/20 137 lb 9.6 oz (62.4 kg)    Blood Pressure: 133 / 79 mmHg  Blood Pressure: 136 / 84 mmHg   Blood Pressure: 160 / 76 mmHg  Blood Pressure: 154 / 77 mmHg Past Medical History:  Diagnosis Date   Actinic keratosis 04/05/2012   Insomnia, unspecified 06/08/2008   Lumbago 01/02/2003   Neoplasm of uncertain behavior of skin 04/05/2012   Pain in limb 02/05/2011   Retinal detachment 2000   left eye   Screening cholesterol level    Shortness of breath 09/07/2008   Vitamin D deficiency 08/04/2012   Past Surgical History:  Procedure Laterality Date   BIOPSY  08/23/2020   Procedure: BIOPSY;  Surgeon: Doran Stabler, MD;  Location: [facility];  Service: Gastroenterology;;   COLONOSCOPY WITH PROPOFOL N/A 08/23/2020   Procedure: COLONOSCOPY WITH PROPOFOL;  Surgeon: Doran Stabler, MD;  Location: [facility];  Service: Gastroenterology;  Laterality: N/A;   ESOPHAGOGASTRODUODENOSCOPY (EGD) WITH PROPOFOL N/A 08/23/2020  Procedure: ESOPHAGOGASTRODUODENOSCOPY (EGD) WITH PROPOFOL;  Surgeon: Doran Stabler, MD;  Location: [facility];  Service: Gastroenterology;  Laterality: N/A;   EXTERNAL EAR SURGERY  05/2012   left ear    INGUINAL HERNIA REPAIR Right 01/21/2005   Dr. [name]   IR IMAGING GUIDED PORT INSERTION  10/09/2020   LID LESION EXCISION  2009   MOHS SURGERY Left 05/28/2012   ear lobe Dr. [name]   RETINAL DETACHMENT SURGERY Left 1997   [facility]   SUBMUCOSAL INJECTION   08/23/2020   Procedure: SUBMUCOSAL INJECTION;  Surgeon: Doran Stabler, MD;  Location: [facility];  Service: Gastroenterology;;    Allergies  Allergen Reactions   Benzalkonium Chloride Rash    Very allergic per patient    Maxitrol [Neomycin-Polymyxin-Dexameth] Itching and Rash   Neosporin [Neomycin-Bacitracin Zn-Polymyx] Itching and Rash    Allergies as of 03/01/2021       Reactions   Benzalkonium Chloride Rash   Very allergic per patient    Maxitrol [neomycin-polymyxin-dexameth] Itching, Rash   Neosporin [neomycin-bacitracin Zn-polymyx] Itching, Rash        Medication List        Accurate as of March 01, 2021  9:28 AM. If you have any questions, ask your nurse or doctor.          STOP taking these medications    LORazepam 0.5 MG tablet Commonly known as: ATIVAN Stopped by: Royal Hawthorn, NP   prochlorperazine 10 MG tablet Commonly known as: COMPAZINE Stopped by: Royal Hawthorn, NP       TAKE these medications    atropine 1 % ophthalmic solution Place 4 drops under the tongue every 6 (six) hours as needed. Prn secretions   bisacodyl 10 MG suppository Commonly known as: DULCOLAX Place 10 mg rectally as needed for moderate constipation.   loperamide 2 MG tablet Commonly known as: IMODIUM A-D Take 2 mg by mouth 2 (two) times daily as needed for diarrhea or loose stools.   melatonin 3 MG Tabs tablet Take 3 mg by mouth at bedtime.   morphine 20 MG/ML concentrated solution Commonly known as: ROXANOL Take 0.25 mLs (5 mg total) by mouth every 4 (four) hours as needed for severe pain.   ondansetron 8 MG tablet Commonly known as: Zofran Take 1 tablet (8 mg total) by mouth 2 (two) times daily as needed for refractory nausea / vomiting. Start on day 3 after chemotherapy.   Systane 0.4-0.3 % Soln Generic drug: Polyethyl Glycol-Propyl Glycol Apply 2 drops to eye 3 (three) times daily as needed.        Review of Systems  Constitutional:   Negative for activity change, appetite change, chills, diaphoresis, fatigue, fever and unexpected weight change.  Respiratory:  Negative for cough, shortness of breath, wheezing and stridor.   Cardiovascular:  Negative for chest pain, palpitations and leg swelling.  Gastrointestinal:  Negative for abdominal distention, abdominal pain, constipation, diarrhea (loose but not frequent), nausea and vomiting.  Genitourinary:  Negative for difficulty urinating and dysuria.  Musculoskeletal:  Positive for gait problem. Negative for arthralgias, back pain, joint swelling and myalgias.  Neurological:  Negative for dizziness, seizures, syncope, facial asymmetry, speech difficulty, weakness and headaches.  Hematological:  Negative for adenopathy. Does not bruise/bleed easily.  Psychiatric/Behavioral:  Positive for confusion. Negative for agitation and behavioral problems.    Immunization History  Administered Date(s) Administered   Hep A / Hep B 07/21/2001, 08/18/2001   Hepatitis A, Ped/Adol-2 Dose 01/24/2002   IPV 08/18/2001  Influenza Whole 12/02/2011, 12/31/2012   Influenza, High Dose Seasonal PF 12/10/2017, 12/10/2018, 12/30/2019   Influenza,inj,Quad PF,6+ Mos 12/20/2013   Influenza-Unspecified 12/21/2014, 12/27/2015, 12/22/2016, 12/10/2018, 12/05/2020   Moderna SARS-COV2 Booster Vaccination 12/12/2020   Moderna Sars-Covid-2 Vaccination 03/25/2019, 04/12/2019, 01/17/2020   Pneumococcal Conjugate-13 11/22/2014   Pneumococcal Polysaccharide-23 07/21/2001, 12/03/2016   Td 04/12/2009   Tdap 12/10/2017, 05/07/2020   Typhoid Inactivated 07/21/2001, 04/12/2009   Zoster, Live 02/01/2011   Pertinent  Health Maintenance Due  Topic Date Due   INFLUENZA VACCINE  Completed   Fall Risk 10/05/2020 10/09/2020 10/10/2020 10/23/2020 10/23/2020  Falls in the past year? - - - - -  Number of falls in past year - - - - -  Was there an injury with Fall? - - - - -  Was there an injury with Fall? - - - - -  Fall Risk  Category Calculator - - - - -  Fall Risk Category - - - - -  Patient Fall Risk Level High fall risk High fall risk High fall risk High fall risk High fall risk  Fall risk Follow up - - - - -   Functional Status Survey:    Vitals:   03/01/21 0911  BP: 133/79  Pulse: 81  Resp: 17  Temp: 98.1 F (36.7 C)  SpO2: 95%  Weight: 139 lb 1.6 oz (63.1 kg)  Height: 5\' 8"  (1.727 m)   Body mass index is 21.15 kg/m. Physical Exam Constitutional:      General: He is not in acute distress.    Appearance: He is not diaphoretic.  HENT:     Head: Normocephalic and atraumatic.  Neck:     Thyroid: No thyromegaly.     Vascular: No JVD.     Trachea: No tracheal deviation.  Cardiovascular:     Rate and Rhythm: Normal rate and regular rhythm.     Heart sounds: Murmur heard.  Pulmonary:     Effort: Pulmonary effort is normal. No respiratory distress.     Breath sounds: Normal breath sounds. No wheezing.  Abdominal:     General: Bowel sounds are normal. There is no distension.     Palpations: Abdomen is soft.     Tenderness: There is no abdominal tenderness.  Musculoskeletal:     Right lower leg: No edema.     Left lower leg: No edema.  Lymphadenopathy:     Cervical: No cervical adenopathy.  Skin:    General: Skin is warm and dry.  Neurological:     Mental Status: He is alert and oriented to person, place, and time.     Cranial Nerves: No cranial nerve deficit.  Psychiatric:     Comments: Easily irritated.     Labs reviewed: Recent Labs    10/10/20 0943 10/23/20 0858 11/01/20 0000 11/30/20 0000 12/31/20 0000 02/11/21 1438  NA 133* 126*   < > 131* 133* 132*  K 4.4 4.4   < > 4.5 4.4 4.6  CL 100 92*   < > 95* 98* 98  CO2 25 26   < > 23* 25* 27  GLUCOSE 110* 106*  --   --   --  109*  BUN 15 13   < > 18 15 16   CREATININE 0.76 0.79   < > 0.7 0.6 0.78  CALCIUM 9.3 9.0   < > 9.7 8.9 8.9   < > = values in this interval not displayed.   Recent Labs    10/10/20 [id number]  10/23/20 0858 11/01/20 0000 02/11/21 1438  AST 16 21 17  11*  ALT 12 17 21 10   ALKPHOS 79 79 89 81  BILITOT 0.6 0.4  --  0.3  PROT 6.5 6.2*  --  6.6  ALBUMIN 3.3* 3.3* 3.6 2.9*   Recent Labs    10/10/20 0943 10/23/20 0858 11/01/20 0000 11/08/20 0000 12/31/20 0000 02/11/21 1438  WBC 6.8 4.6   < > 3.8 5.0 11.8*  NEUTROABS 5.0 3.2  --   --   --  9.6*  HGB 9.9* 9.2*   < > 9.6* 10.3* 9.8*  HCT 31.2* 27.3*   < > 28* 31* 29.6*  MCV 83.0 80.8  --   --   --  88.1  PLT 302 281   < > 185 320 449*   < > = values in this interval not displayed.   Lab Results  Component Value Date   TSH 2.045 02/11/2021   Lab Results  Component Value Date   HGBA1C 5.9 03/09/2012   Lab Results  Component Value Date   CHOL 189 12/16/2018   HDL 69 12/16/2018   LDLCALC 111 12/16/2018   TRIG 45 12/16/2018    Significant Diagnostic Results in last 30 days:  No results found.  Assessment/Plan  1. Iron deficiency anemia due to chronic blood loss  Lab Results  Component Value Date   HGB 9.8 (L) 02/11/2021   Lab Results  Component Value Date   IRON 16 09/11/2020   TIBC 369 09/11/2020   FERRITIN 7.04 09/11/2020   Followed by heme/onc   2. Adenocarcinoma of rectum [facility]) Despite not completing treatment he is doing well with minimal symptoms and maintaining his weight. His goals of care are comfort based with no further intervention. He has meds for comfort if he declines.   3. Aortic atherosclerosis ([place]) Note don CT 09/05/20 No new recommendations due to goals of care.   4. Varicose veins of leg with edema, bilateral Wears compression hose No edema at [facility] stime  5. Diarrhea due to malabsorption Continue prn imodium   6. Primary insomnia Sleeping well wit melatonin per pt  7. Hyponatremia NA 132  Continue to monitor  Labs/tests ordered:  labs done at cancer center, pt has portacath

## 2021-03-25 ENCOUNTER — Non-Acute Institutional Stay (SKILLED_NURSING_FACILITY): Payer: Medicare PPO | Admitting: Internal Medicine

## 2021-03-25 ENCOUNTER — Encounter: Payer: Self-pay | Admitting: Internal Medicine

## 2021-03-25 DIAGNOSIS — C20 Malignant neoplasm of rectum: Secondary | ICD-10-CM | POA: Diagnosis not present

## 2021-03-25 DIAGNOSIS — R4189 Other symptoms and signs involving cognitive functions and awareness: Secondary | ICD-10-CM

## 2021-03-25 DIAGNOSIS — I350 Nonrheumatic aortic (valve) stenosis: Secondary | ICD-10-CM

## 2021-03-25 DIAGNOSIS — D500 Iron deficiency anemia secondary to blood loss (chronic): Secondary | ICD-10-CM | POA: Diagnosis not present

## 2021-03-25 NOTE — Progress Notes (Signed)
Location:  [facility] of Service:  SNF (31)  Provider: [name]   Code Status: Hospice Goals of Care:  Advanced Directives 03/01/2021  Does Patient Have a Medical Advance Directive? Yes  Type of Paramedic of [place];Living will;Out of facility DNR (pink MOST or yellow form)  Does patient want to make changes to medical advance directive? No - Patient declined  Copy of [place] in Chart? Yes - validated most recent copy scanned in chart (See row information)  Would patient like information on creating a medical advance directive? -  Pre-existing out of facility DNR order (yellow form or pink MOST form) Yellow form placed in chart (order not valid for inpatient use)     Chief Complaint  Patient presents with   Medical Management of Chronic Issues    HPI: Patient is a 88 y.o. male seen today for medical management of chronic diseases.     Patient with diagnosis of adenocarcinoma of rectum . Did not tolerate Chemo and Decided to not pursue any treatment Was taken off all meds  He  is stable.No new Nursing issues. No Behavior issues His weight is stable Walks with his walker No Falls Wt Readings from Last 3 Encounters:  03/01/21 139 lb 1.6 oz (63.1 kg)  01/21/21 139 lb 1.6 oz (63.1 kg)  12/28/20 137 lb 9.6 oz (62.4 kg)  Wanted to know if he can see Opthalmologist  Past Medical History:  Diagnosis Date   Actinic keratosis 04/05/2012   Insomnia, unspecified 06/08/2008   Lumbago 01/02/2003   Neoplasm of uncertain behavior of skin 04/05/2012   Pain in limb 02/05/2011   Retinal detachment 2000   left eye   Screening cholesterol level    Shortness of breath 09/07/2008   Vitamin D deficiency 08/04/2012    Past Surgical History:  Procedure Laterality Date   BIOPSY  08/23/2020   Procedure: BIOPSY;  Surgeon: Doran Stabler, MD;  Location: [facility];  Service: Gastroenterology;;   COLONOSCOPY WITH  PROPOFOL N/A 08/23/2020   Procedure: COLONOSCOPY WITH PROPOFOL;  Surgeon: Doran Stabler, MD;  Location: [facility];  Service: Gastroenterology;  Laterality: N/A;   ESOPHAGOGASTRODUODENOSCOPY (EGD) WITH PROPOFOL N/A 08/23/2020   Procedure: ESOPHAGOGASTRODUODENOSCOPY (EGD) WITH PROPOFOL;  Surgeon: Doran Stabler, MD;  Location: [facility];  Service: Gastroenterology;  Laterality: N/A;   EXTERNAL EAR SURGERY  05/2012   left ear    INGUINAL HERNIA REPAIR Right 01/21/2005   Dr. [name]   IR IMAGING GUIDED PORT INSERTION  10/09/2020   LID LESION EXCISION  2009   MOHS SURGERY Left 05/28/2012   ear lobe Dr. [name]   RETINAL DETACHMENT SURGERY Left 1997   [facility]   SUBMUCOSAL INJECTION  08/23/2020   Procedure: SUBMUCOSAL INJECTION;  Surgeon: Doran Stabler, MD;  Location: [facility];  Service: Gastroenterology;;    Allergies  Allergen Reactions   Benzalkonium Chloride Rash    Very allergic per patient    Maxitrol [Neomycin-Polymyxin-Dexameth] Itching and Rash   Neosporin [Neomycin-Bacitracin Zn-Polymyx] Itching and Rash    Outpatient Encounter Medications as of 03/25/2021  Medication Sig   atropine 1 % ophthalmic solution Place 4 drops under the tongue every 6 (six) hours as needed. Prn secretions   bisacodyl (DULCOLAX) 10 MG suppository Place 10 mg rectally as needed for moderate constipation.   loperamide (IMODIUM A-D) 2 MG tablet Take 2 mg by mouth 2 (two) times daily  as needed for diarrhea or loose stools.   melatonin 3 MG TABS tablet Take 3 mg by mouth at bedtime.   morphine (ROXANOL) 20 MG/ML concentrated solution Take 0.25 mLs (5 mg total) by mouth every 4 (four) hours as needed for severe pain.   ondansetron (ZOFRAN) 8 MG tablet Take 1 tablet (8 mg total) by mouth 2 (two) times daily as needed for refractory nausea / vomiting. Start on day 3 after chemotherapy.   Polyethyl Glycol-Propyl Glycol (SYSTANE) 0.4-0.3 % SOLN Apply 2 drops to eye 3 (three) times daily as  needed.   No facility-administered encounter medications on file as of 03/25/2021.    Review of Systems:  Review of Systems  Constitutional:  Negative for activity change, appetite change and unexpected weight change.  HENT: Negative.    Respiratory:  Negative for cough and shortness of breath.   Cardiovascular:  Negative for leg swelling.  Gastrointestinal:  Negative for constipation.       Continues to have multiple Bowel movements but he says no Blood and np Pain  Genitourinary:  Negative for frequency.  Musculoskeletal:  Negative for arthralgias, gait problem and myalgias.  Skin: Negative.  Negative for rash.  Neurological:  Negative for dizziness and weakness.  Psychiatric/Behavioral:  Positive for confusion. Negative for sleep disturbance.   All other systems reviewed and are negative.  Health Maintenance  Topic Date Due   Zoster Vaccines- Shingrix (1 of 2) Never done   COVID-19 Vaccine (4 - Booster) 05/01/2021 (Originally 02/06/2021)   TETANUS/TDAP  05/08/2030   Pneumonia Vaccine 33+ Years old  Completed   INFLUENZA VACCINE  Completed   HPV VACCINES  Aged Out    Physical Exam: Vitals:   03/25/21 2003  BP: (!) 143/82  Pulse: (!) 55  Resp: 18  Temp: (!) 97.2 F (36.2 C)   There is no height or weight on file to calculate BMI. Physical Exam Vitals reviewed.  Constitutional:      Appearance: Normal appearance.  HENT:     Head: Normocephalic.     Mouth/Throat:     Mouth: Mucous membranes are moist.     Pharynx: Oropharynx is clear.  Eyes:     Pupils: Pupils are equal, round, and reactive to light.  Cardiovascular:     Rate and Rhythm: Normal rate and regular rhythm.     Pulses: Normal pulses.     Heart sounds: Murmur heard.  Pulmonary:     Effort: Pulmonary effort is normal. No respiratory distress.     Breath sounds: Normal breath sounds. No rales.  Abdominal:     General: Abdomen is flat. Bowel sounds are normal. There is distension.     Palpations:  Abdomen is soft.  Musculoskeletal:        General: No swelling.     Cervical back: Neck supple.  Skin:    General: Skin is warm.  Neurological:     General: No focal deficit present.     Mental Status: He is alert.     Comments: Does repeat himself and gets confused but continues to do well and is walking with his walker  Psychiatric:        Mood and Affect: Mood normal.        Thought Content: Thought content normal.    Labs reviewed: Basic Metabolic Panel: Recent Labs    10/10/20 0943 10/23/20 0858 11/01/20 0000 11/30/20 0000 12/31/20 0000 02/11/21 1438  NA 133* 126*   < > 131* 133* 132*  K 4.4 4.4   < > 4.5 4.4 4.6  CL 100 92*   < > 95* 98* 98  CO2 25 26   < > 23* 25* 27  GLUCOSE 110* 106*  --   --   --  109*  BUN 15 13   < > 18 15 16   CREATININE 0.76 0.79   < > 0.7 0.6 0.78  CALCIUM 9.3 9.0   < > 9.7 8.9 8.9  TSH 1.447 1.495  --   --   --  2.045   < > = values in this interval not displayed.   Liver Function Tests: Recent Labs    10/10/20 0943 10/23/20 0858 11/01/20 0000 02/11/21 1438  AST 16 21 17  11*  ALT 12 17 21 10   ALKPHOS 79 79 89 81  BILITOT 0.6 0.4  --  0.3  PROT 6.5 6.2*  --  6.6  ALBUMIN 3.3* 3.3* 3.6 2.9*   No results for input(s): LIPASE, AMYLASE in the last 8760 hours. No results for input(s): AMMONIA in the last 8760 hours. CBC: Recent Labs    10/10/20 0943 10/23/20 0858 11/01/20 0000 11/08/20 0000 12/31/20 0000 02/11/21 1438  WBC 6.8 4.6   < > 3.8 5.0 11.8*  NEUTROABS 5.0 3.2  --   --   --  9.6*  HGB 9.9* 9.2*   < > 9.6* 10.3* 9.8*  HCT 31.2* 27.3*   < > 28* 31* 29.6*  MCV 83.0 80.8  --   --   --  88.1  PLT 302 281   < > 185 320 449*   < > = values in this interval not displayed.   Lipid Panel: No results for input(s): CHOL, HDL, LDLCALC, TRIG, CHOLHDL, LDLDIRECT in the last 8760 hours. Lab Results  Component Value Date   HGBA1C 5.9 03/09/2012    Procedures since last visit: No results found.  Assessment/Plan 1.  Adenocarcinoma of rectum ([place]) No More treatment Does not want Hospice yet  2. Iron deficiency anemia due to chronic blood loss No More blood work for now  3. Cognitive impairment Significant decline Still walking with his walker  4. Mild aortic stenosis by prior echocardiogram No Symptoms    Labs/tests ordered:  * No order type specified *  [name], MD

## 2021-04-08 ENCOUNTER — Inpatient Hospital Stay: Payer: Medicare PPO | Attending: Hematology and Oncology

## 2021-04-08 ENCOUNTER — Other Ambulatory Visit: Payer: Self-pay

## 2021-04-08 DIAGNOSIS — Z95828 Presence of other vascular implants and grafts: Secondary | ICD-10-CM

## 2021-04-08 DIAGNOSIS — Z452 Encounter for adjustment and management of vascular access device: Secondary | ICD-10-CM | POA: Insufficient documentation

## 2021-04-08 DIAGNOSIS — C20 Malignant neoplasm of rectum: Secondary | ICD-10-CM | POA: Insufficient documentation

## 2021-04-08 MED ORDER — HEPARIN SOD (PORK) LOCK FLUSH 100 UNIT/ML IV SOLN
500.0000 [IU] | INTRAVENOUS | Status: AC | PRN
  Administered 2021-04-08: 500 [IU]

## 2021-04-08 MED ORDER — SODIUM CHLORIDE 0.9% FLUSH
10.0000 mL | INTRAVENOUS | Status: AC | PRN
  Administered 2021-04-08: 10 mL

## 2021-05-03 ENCOUNTER — Non-Acute Institutional Stay (SKILLED_NURSING_FACILITY): Payer: Medicare PPO | Admitting: Adult Health

## 2021-05-03 ENCOUNTER — Encounter: Payer: Self-pay | Admitting: Adult Health

## 2021-05-03 DIAGNOSIS — R413 Other amnesia: Secondary | ICD-10-CM | POA: Diagnosis not present

## 2021-05-03 DIAGNOSIS — C20 Malignant neoplasm of rectum: Secondary | ICD-10-CM

## 2021-05-03 DIAGNOSIS — K909 Intestinal malabsorption, unspecified: Secondary | ICD-10-CM

## 2021-05-03 DIAGNOSIS — R197 Diarrhea, unspecified: Secondary | ICD-10-CM

## 2021-05-03 DIAGNOSIS — D500 Iron deficiency anemia secondary to blood loss (chronic): Secondary | ICD-10-CM

## 2021-05-03 NOTE — Progress Notes (Signed)
Location:   [place] Room Number: 235 Place of Service:  SNF (31) Provider:  Royal Hawthorn, NP .  [name], MD  Patient Care Team: [name], MD as PCP - General (Internal Medicine) Community, Well [name], MD as Consulting Physician (Ophthalmology) Benay Pike, MD as Consulting Physician (Hematology and Oncology)  Extended Emergency Contact Information Primary Emergency Contact: [place], [place] [address] Johnnette Litter of [place] Phone: [phone] Mobile Phone: [phone] Relation: Daughter  Code Status:  DNR Goals of care: Advanced Directive information Advanced Directives 05/03/2021  Does Patient Have a Medical Advance Directive? Yes  Type of Advance Directive Out of facility DNR (pink MOST or yellow form);Living will;Healthcare Power of Attorney  Does patient want to make changes to medical advance directive? No - Patient declined  Copy of [place] in Chart? Yes - validated most recent copy scanned in chart (See row information)  Would patient like information on creating a medical advance directive? -  Pre-existing out of facility DNR order (yellow form or pink MOST form) Pink MOST form placed in chart (order not valid for inpatient use);Yellow form placed in chart (order not valid for inpatient use)     Chief Complaint  Patient presents with   Medical Management of Chronic Issues   Quality Metric Gaps    Zoster Vaccines- Shingrix (1 of 2)   2022 COVID-19 Vaccine [facility]/Matrix verified      HPI:  Pt is a 88 y.o. male seen today for medical management of chronic diseases.  PMH significant for iron def anemia due to chronic blood loss and adenocarcinoma of the rectum. He has chosen to forego further treatment and his goals of care are comfort based. He has previously declined hospice services.  For my visit today he is in bed for a nap. He  denies any abd pain. Does have chronic distention. Reports "muddy" loose stools but none recently. His weight is down 3 lbs in the past three months.  Wt Readings from Last 3 Encounters:  05/03/21 136 lb 6.4 oz (61.9 kg)  03/25/21 138 lb 9.6 oz (62.9 kg)  03/01/21 139 lb 1.6 oz (63.1 kg)  He has a porta cath not in current use.  NO longer on iron due to goals of care Lab Results  Component Value Date   HGB 9.8 (L) 02/11/2021   Aortic stenosis : no issues with syncope or sob. Described as mild by cardiology   Has some short term memory loss MMSE 22/30  Remains ambulatory and abe to walk to dine.     Past Medical History:  Diagnosis Date   Actinic keratosis 04/05/2012   Insomnia, unspecified 06/08/2008   Lumbago 01/02/2003   Neoplasm of uncertain behavior of skin 04/05/2012   Pain in limb 02/05/2011   Retinal detachment 2000   left eye   Screening cholesterol level    Shortness of breath 09/07/2008   Vitamin D deficiency 08/04/2012   Past Surgical History:  Procedure Laterality Date   BIOPSY  08/23/2020   Procedure: BIOPSY;  Surgeon: Doran Stabler, MD;  Location: [facility];  Service: Gastroenterology;;   COLONOSCOPY WITH PROPOFOL N/A 08/23/2020   Procedure: COLONOSCOPY WITH PROPOFOL;  Surgeon: Doran Stabler, MD;  Location: [facility];  Service: Gastroenterology;  Laterality: N/A;   ESOPHAGOGASTRODUODENOSCOPY (EGD) WITH PROPOFOL N/A 08/23/2020   Procedure: ESOPHAGOGASTRODUODENOSCOPY (EGD) WITH PROPOFOL;  Surgeon: Loletha Carrow,  Kirke Corin, MD;  Location: [facility];  Service: Gastroenterology;  Laterality: N/A;   EXTERNAL EAR SURGERY  05/2012   left ear    INGUINAL HERNIA REPAIR Right 01/21/2005   Dr. [name]   IR IMAGING GUIDED PORT INSERTION  10/09/2020   LID LESION EXCISION  2009   MOHS SURGERY Left 05/28/2012   ear lobe Dr. [name]   RETINAL DETACHMENT SURGERY Left 1997   [facility]   SUBMUCOSAL INJECTION  08/23/2020   Procedure: SUBMUCOSAL INJECTION;  Surgeon: Doran Stabler, MD;  Location: [facility];  Service: Gastroenterology;;    Allergies  Allergen Reactions   Benzalkonium Chloride Rash    Very allergic per patient    Maxitrol [Neomycin-Polymyxin-Dexameth] Itching and Rash   Neosporin [Neomycin-Bacitracin Zn-Polymyx] Itching and Rash    Allergies as of 05/03/2021       Reactions   Benzalkonium Chloride Rash   Very allergic per patient    Maxitrol [neomycin-polymyxin-dexameth] Itching, Rash   Neosporin [neomycin-bacitracin Zn-polymyx] Itching, Rash        Medication List        Accurate as of May 03, 2021  2:59 PM. If you have any questions, ask your nurse or doctor.          atropine 1 % ophthalmic solution Place 4 drops under the tongue every 6 (six) hours as needed. Prn secretions   bisacodyl 10 MG suppository Commonly known as: DULCOLAX Place 10 mg rectally as needed for moderate constipation.   loperamide 2 MG tablet Commonly known as: IMODIUM A-D Take 2 mg by mouth 2 (two) times daily as needed for diarrhea or loose stools.   melatonin 3 MG Tabs tablet Take 3 mg by mouth at bedtime.   morphine 20 MG/ML concentrated solution Commonly known as: ROXANOL Take 0.25 mLs (5 mg total) by mouth every 4 (four) hours as needed for severe pain.   ondansetron 8 MG tablet Commonly known as: Zofran Take 1 tablet (8 mg total) by mouth 2 (two) times daily as needed for refractory nausea / vomiting. Start on day 3 after chemotherapy.   Systane 0.4-0.3 % Soln Generic drug: Polyethyl Glycol-Propyl Glycol Apply 2 drops to eye 3 (three) times daily as needed.        Review of Systems  Constitutional:  Negative for activity change, appetite change, chills, diaphoresis, fatigue, fever and unexpected weight change.  Respiratory:  Negative for cough, shortness of breath, wheezing and stridor.   Cardiovascular:  Negative for chest pain, palpitations and leg swelling.  Gastrointestinal:  Negative for abdominal distention, abdominal  pain, constipation and diarrhea (intermittent).  Genitourinary:  Negative for difficulty urinating and dysuria.  Musculoskeletal:  Positive for gait problem. Negative for arthralgias, back pain, joint swelling and myalgias.  Neurological:  Negative for dizziness, seizures, syncope, facial asymmetry, speech difficulty, weakness and headaches.  Hematological:  Negative for adenopathy. Does not bruise/bleed easily.  Psychiatric/Behavioral:  Positive for confusion. Negative for agitation and behavioral problems.    Immunization History  Administered Date(s) Administered   Hep A / Hep B 07/21/2001, 08/18/2001   Hepatitis A, Ped/Adol-2 Dose 01/24/2002   IPV 08/18/2001   Influenza Whole 12/02/2011, 12/31/2012   Influenza, High Dose Seasonal PF 12/10/2017, 12/10/2018, 12/30/2019   Influenza,inj,Quad PF,6+ Mos 12/20/2013   Influenza-Unspecified 12/21/2014, 12/27/2015, 12/22/2016, 12/10/2018, 12/05/2020   Moderna SARS-COV2 Booster Vaccination 12/12/2020   Moderna Sars-Covid-2 Vaccination 03/25/2019, 04/12/2019, 01/17/2020   Pneumococcal Conjugate-13 11/22/2014   Pneumococcal Polysaccharide-23 07/21/2001, 12/03/2016   Td 04/12/2009  Tdap 12/10/2017, 05/07/2020   Typhoid Inactivated 07/21/2001, 04/12/2009   Zoster, Live 02/01/2011   Pertinent  Health Maintenance Due  Topic Date Due   INFLUENZA VACCINE  Completed   Fall Risk 10/05/2020 10/09/2020 10/10/2020 10/23/2020 10/23/2020  Falls in the past year? - - - - -  Number of falls in past year - - - - -  Was there an injury with Fall? - - - - -  Was there an injury with Fall? - - - - -  Fall Risk Category Calculator - - - - -  Fall Risk Category - - - - -  Patient Fall Risk Level High fall risk High fall risk High fall risk High fall risk High fall risk  Fall risk Follow up - - - - -   Functional Status Survey:    Vitals:   05/03/21 1217  BP: 139/80  Pulse: 97  Resp: 18  Temp: 98.1 F (36.7 C)  SpO2: 98%  Weight: 136 lb 6.4 oz (61.9 kg)   Height: 5\' 8"  (1.727 m)   Body mass index is 20.74 kg/m. Physical Exam Vitals reviewed.  Constitutional:      General: He is not in acute distress.    Appearance: He is not diaphoretic.  HENT:     Head: Normocephalic and atraumatic.  Neck:     Thyroid: No thyromegaly.     Vascular: No JVD.     Trachea: No tracheal deviation.  Cardiovascular:     Rate and Rhythm: Normal rate and regular rhythm.     Heart sounds: Murmur heard.  Pulmonary:     Effort: Pulmonary effort is normal. No respiratory distress.     Breath sounds: Normal breath sounds. No wheezing.  Abdominal:     General: Bowel sounds are normal. There is no distension (mild).     Palpations: Abdomen is soft.     Tenderness: There is no abdominal tenderness.  Musculoskeletal:     Right lower leg: No edema.     Left lower leg: No edema.  Lymphadenopathy:     Cervical: No cervical adenopathy.  Skin:    General: Skin is warm and dry.  Neurological:     Mental Status: He is alert and oriented to person, place, and time.     Cranial Nerves: No cranial nerve deficit.    Labs reviewed: Recent Labs    10/10/20 0943 10/23/20 0858 11/01/20 0000 11/30/20 0000 12/31/20 0000 02/11/21 1438  NA 133* 126*   < > 131* 133* 132*  K 4.4 4.4   < > 4.5 4.4 4.6  CL 100 92*   < > 95* 98* 98  CO2 25 26   < > 23* 25* 27  GLUCOSE 110* 106*  --   --   --  109*  BUN 15 13   < > 18 15 16   CREATININE 0.76 0.79   < > 0.7 0.6 0.78  CALCIUM 9.3 9.0   < > 9.7 8.9 8.9   < > = values in this interval not displayed.   Recent Labs    10/10/20 0943 10/23/20 0858 11/01/20 0000 02/11/21 1438  AST 16 21 17  11*  ALT 12 17 21 10   ALKPHOS 79 79 89 81  BILITOT 0.6 0.4  --  0.3  PROT 6.5 6.2*  --  6.6  ALBUMIN 3.3* 3.3* 3.6 2.9*   Recent Labs    10/10/20 0943 10/23/20 0858 11/01/20 0000 11/08/20 0000 12/31/20 0000 02/11/21 1438  WBC  6.8 4.6   < > 3.8 5.0 11.8*  NEUTROABS 5.0 3.2  --   --   --  9.6*  HGB 9.9* 9.2*   < > 9.6*  10.3* 9.8*  HCT 31.2* 27.3*   < > 28* 31* 29.6*  MCV 83.0 80.8  --   --   --  88.1  PLT 302 281   < > 185 320 449*   < > = values in this interval not displayed.   Lab Results  Component Value Date   TSH 2.045 02/11/2021   Lab Results  Component Value Date   HGBA1C 5.9 03/09/2012   Lab Results  Component Value Date   CHOL 189 12/16/2018   HDL 69 12/16/2018   LDLCALC 111 12/16/2018   TRIG 45 12/16/2018    Significant Diagnostic Results in last 30 days:  No results found.  Assessment/Plan  1. Memory loss Mild to moderate Doing well in skilled care  2. Adenocarcinoma of rectum ([place]) No further work up or treatment per pt wishes Has morphine ordered but no pain at this time.   3. Iron deficiency anemia due to chronic blood loss Off iron Continue to monitor.   4. Diarrhea due to malabsorption Intermittent  Continue imodium prn   Family/ staff Communication:   Labs/tests ordered:

## 2021-05-13 ENCOUNTER — Other Ambulatory Visit: Payer: Self-pay

## 2021-05-13 ENCOUNTER — Non-Acute Institutional Stay (SKILLED_NURSING_FACILITY): Payer: Medicare PPO | Admitting: Adult Health

## 2021-05-13 ENCOUNTER — Encounter: Payer: Self-pay | Admitting: Adult Health

## 2021-05-13 DIAGNOSIS — E871 Hypo-osmolality and hyponatremia: Secondary | ICD-10-CM

## 2021-05-13 DIAGNOSIS — G9341 Metabolic encephalopathy: Secondary | ICD-10-CM | POA: Insufficient documentation

## 2021-05-13 DIAGNOSIS — D509 Iron deficiency anemia, unspecified: Principal | ICD-10-CM | POA: Insufficient documentation

## 2021-05-13 DIAGNOSIS — R531 Weakness: Secondary | ICD-10-CM

## 2021-05-13 DIAGNOSIS — C20 Malignant neoplasm of rectum: Secondary | ICD-10-CM

## 2021-05-13 DIAGNOSIS — R410 Disorientation, unspecified: Secondary | ICD-10-CM | POA: Diagnosis not present

## 2021-05-13 DIAGNOSIS — Z85828 Personal history of other malignant neoplasm of skin: Secondary | ICD-10-CM | POA: Diagnosis not present

## 2021-05-13 DIAGNOSIS — D500 Iron deficiency anemia secondary to blood loss (chronic): Secondary | ICD-10-CM

## 2021-05-13 DIAGNOSIS — Z20822 Contact with and (suspected) exposure to covid-19: Secondary | ICD-10-CM | POA: Insufficient documentation

## 2021-05-13 DIAGNOSIS — D649 Anemia, unspecified: Secondary | ICD-10-CM | POA: Diagnosis present

## 2021-05-13 LAB — CBC WITH DIFFERENTIAL/PLATELET
Abs Immature Granulocytes: 0.06 10*3/uL (ref 0.00–0.07)
Basophils Absolute: 0 10*3/uL (ref 0.0–0.1)
Basophils Relative: 0 %
Eosinophils Absolute: 0 10*3/uL (ref 0.0–0.5)
Eosinophils Relative: 0 %
HCT: 26 % — ABNORMAL LOW (ref 39.0–52.0)
Hemoglobin: 8.2 g/dL — ABNORMAL LOW (ref 13.0–17.0)
Immature Granulocytes: 0 %
Lymphocytes Relative: 8 %
Lymphs Abs: 1.1 10*3/uL (ref 0.7–4.0)
MCH: 24.5 pg — ABNORMAL LOW (ref 26.0–34.0)
MCHC: 31.5 g/dL (ref 30.0–36.0)
MCV: 77.6 fL — ABNORMAL LOW (ref 80.0–100.0)
Monocytes Absolute: 1.5 10*3/uL — ABNORMAL HIGH (ref 0.1–1.0)
Monocytes Relative: 10 %
Neutro Abs: 12 10*3/uL — ABNORMAL HIGH (ref 1.7–7.7)
Neutrophils Relative %: 82 %
Platelets: 453 10*3/uL — ABNORMAL HIGH (ref 150–400)
RBC: 3.35 MIL/uL — ABNORMAL LOW (ref 4.22–5.81)
RDW: 15.3 % (ref 11.5–15.5)
WBC: 14.6 10*3/uL — ABNORMAL HIGH (ref 4.0–10.5)
nRBC: 0 % (ref 0.0–0.2)

## 2021-05-13 NOTE — Progress Notes (Signed)
Location:  [facility] of Service:  SNF (31) Provider:   Cindi Carbon, [place] [phone]   [name], MD  Patient Care Team: [name], MD as PCP - General (Internal Medicine) Community, Well [name], MD as Consulting Physician (Ophthalmology) Benay Pike, MD as Consulting Physician (Hematology and Oncology)  Extended Emergency Contact Information Primary Emergency Contact: [place], [place] [address] Johnnette Litter of [place] Phone: [phone] Mobile Phone: [phone] Relation: Daughter  Code Status:  DNR Goals of care: Advanced Directive information Advanced Directives 05/03/2021  Does Patient Have a Medical Advance Directive? Yes  Type of Advance Directive Out of facility DNR (pink MOST or yellow form);Living will;Healthcare Power of Attorney  Does patient want to make changes to medical advance directive? No - Patient declined  Copy of [place] in Chart? Yes - validated most recent copy scanned in chart (See row information)  Would patient like information on creating a medical advance directive? -  Pre-existing out of facility DNR order (yellow form or pink MOST form) Pink MOST form placed in chart (order not valid for inpatient use);Yellow form placed in chart (order not valid for inpatient use)     Chief Complaint  Patient presents with   Acute Visit    Weakness and confusion    HPI:  Pt is a 88 y.o. male seen today for an acute visit for weakness and confusion.  PMH significant for iron def anemia due to chronic blood loss and adenocarcinoma of the rectum. He has chosen to forego further treatment and his goals of care are comfort based. He has had some weakness and unsteady gait, along with increased confusion for the past two days. Seems more acute onset her his daughter. He does have underlying memory loss. He denies any abd  pain or rectal bleeding. Has prior hx of iron supplementation oral and IV.  Temp 99 3/12 97.7 3/13 HR 111-112. LBM 3/12 Intakes 76-100% at breakfast and lunch.  Denies any dizziness or one sided weakness. Does feel tired and weak. No urinary symptoms. Has porta cath in place.   Past Medical History:  Diagnosis Date   Actinic keratosis 04/05/2012   Insomnia, unspecified 06/08/2008   Lumbago 01/02/2003   Neoplasm of uncertain behavior of skin 04/05/2012   Pain in limb 02/05/2011   Retinal detachment 2000   left eye   Screening cholesterol level    Shortness of breath 09/07/2008   Vitamin D deficiency 08/04/2012   Past Surgical History:  Procedure Laterality Date   BIOPSY  08/23/2020   Procedure: BIOPSY;  Surgeon: Doran Stabler, MD;  Location: [facility];  Service: Gastroenterology;;   COLONOSCOPY WITH PROPOFOL N/A 08/23/2020   Procedure: COLONOSCOPY WITH PROPOFOL;  Surgeon: Doran Stabler, MD;  Location: [facility];  Service: Gastroenterology;  Laterality: N/A;   ESOPHAGOGASTRODUODENOSCOPY (EGD) WITH PROPOFOL N/A 08/23/2020   Procedure: ESOPHAGOGASTRODUODENOSCOPY (EGD) WITH PROPOFOL;  Surgeon: Doran Stabler, MD;  Location: [facility];  Service: Gastroenterology;  Laterality: N/A;   EXTERNAL EAR SURGERY  05/2012   left ear    INGUINAL HERNIA REPAIR Right 01/21/2005   Dr. [name]   IR IMAGING GUIDED PORT INSERTION  10/09/2020   LID LESION EXCISION  2009   MOHS SURGERY Left 05/28/2012   ear lobe Dr. [name]   RETINAL DETACHMENT SURGERY Left 1997   [facility]  SUBMUCOSAL INJECTION  08/23/2020   Procedure: SUBMUCOSAL INJECTION;  Surgeon: Doran Stabler, MD;  Location: [facility];  Service: Gastroenterology;;    Allergies  Allergen Reactions   Benzalkonium Chloride Rash    Very allergic per patient    Maxitrol [Neomycin-Polymyxin-Dexameth] Itching and Rash   Neosporin [Neomycin-Bacitracin Zn-Polymyx] Itching and Rash    Outpatient Encounter Medications as of 05/13/2021   Medication Sig   atropine 1 % ophthalmic solution Place 4 drops under the tongue every 6 (six) hours as needed. Prn secretions   bisacodyl (DULCOLAX) 10 MG suppository Place 10 mg rectally as needed for moderate constipation.   loperamide (IMODIUM A-D) 2 MG tablet Take 2 mg by mouth 2 (two) times daily as needed for diarrhea or loose stools.   melatonin 3 MG TABS tablet Take 3 mg by mouth at bedtime.   morphine (ROXANOL) 20 MG/ML concentrated solution Take 0.25 mLs (5 mg total) by mouth every 4 (four) hours as needed for severe pain.   ondansetron (ZOFRAN) 8 MG tablet Take 1 tablet (8 mg total) by mouth 2 (two) times daily as needed for refractory nausea / vomiting. Start on day 3 after chemotherapy.   Polyethyl Glycol-Propyl Glycol (SYSTANE) 0.4-0.3 % SOLN Apply 2 drops to eye 3 (three) times daily as needed.   No facility-administered encounter medications on file as of 05/13/2021.    Review of Systems  Constitutional:  Positive for activity change and fatigue. Negative for appetite change, chills, diaphoresis, fever and unexpected weight change.  HENT:  Negative for congestion.   Respiratory:  Negative for cough, shortness of breath, wheezing and stridor.   Cardiovascular:  Negative for chest pain, palpitations and leg swelling.  Gastrointestinal:  Negative for abdominal distention, abdominal pain, constipation and diarrhea.  Genitourinary:  Negative for difficulty urinating and dysuria.  Musculoskeletal:  Positive for gait problem. Negative for arthralgias, back pain, joint swelling and myalgias.  Neurological:  Positive for weakness. Negative for dizziness, seizures, syncope, facial asymmetry, speech difficulty and headaches.  Hematological:  Negative for adenopathy. Does not bruise/bleed easily.  Psychiatric/Behavioral:  Positive for confusion. Negative for agitation and behavioral problems.    Immunization History  Administered Date(s) Administered   Hep A / Hep B 07/21/2001,  08/18/2001   Hepatitis A, Ped/Adol-2 Dose 01/24/2002   IPV 08/18/2001   Influenza Whole 12/02/2011, 12/31/2012   Influenza, High Dose Seasonal PF 12/10/2017, 12/10/2018, 12/30/2019   Influenza,inj,Quad PF,6+ Mos 12/20/2013   Influenza-Unspecified 12/21/2014, 12/27/2015, 12/22/2016, 12/10/2018, 12/05/2020   Moderna SARS-COV2 Booster Vaccination 12/12/2020   Moderna Sars-Covid-2 Vaccination 03/25/2019, 04/12/2019, 01/17/2020   Pneumococcal Conjugate-13 11/22/2014   Pneumococcal Polysaccharide-23 07/21/2001, 12/03/2016   Td 04/12/2009   Tdap 12/10/2017, 05/07/2020   Typhoid Inactivated 07/21/2001, 04/12/2009   Zoster, Live 02/01/2011   Pertinent  Health Maintenance Due  Topic Date Due   INFLUENZA VACCINE  Completed   Fall Risk 10/05/2020 10/09/2020 10/10/2020 10/23/2020 10/23/2020  Falls in the past year? - - - - -  Number of falls in past year - - - - -  Was there an injury with Fall? - - - - -  Was there an injury with Fall? - - - - -  Fall Risk Category Calculator - - - - -  Fall Risk Category - - - - -  Patient Fall Risk Level High fall risk High fall risk High fall risk High fall risk High fall risk  Fall risk Follow up - - - - -   Functional Status Survey:  Vitals:   05/13/21 1146  BP: (!) 158/74  Pulse: (!) 112  Resp: 20  Temp: 97.7 F (36.5 C)  SpO2: 98%   There is no height or weight on file to calculate BMI. Physical Exam Vitals and nursing note reviewed.  Constitutional:      General: He is not in acute distress.    Appearance: He is not diaphoretic.  HENT:     Head: Normocephalic and atraumatic.     Mouth/Throat:     Mouth: Mucous membranes are moist.     Pharynx: Oropharynx is clear.  Eyes:     General:        Right eye: No discharge.        Left eye: No discharge.     Extraocular Movements: Extraocular movements intact.     Conjunctiva/sclera: Conjunctivae normal.     Pupils: Pupils are equal, round, and reactive to light.  Neck:     Thyroid: No  thyromegaly.     Vascular: No JVD.     Trachea: No tracheal deviation.  Cardiovascular:     Rate and Rhythm: Regular rhythm. Tachycardia present.     Heart sounds: Murmur heard.  Pulmonary:     Effort: Pulmonary effort is normal. No respiratory distress.     Breath sounds: Normal breath sounds. No wheezing.  Abdominal:     General: Bowel sounds are normal. There is distension (chronic).     Palpations: Abdomen is soft.     Tenderness: There is no abdominal tenderness.  Musculoskeletal:        General: No swelling, tenderness, deformity or signs of injury.     Comments: Trace edema bilat  Lymphadenopathy:     Cervical: No cervical adenopathy.  Skin:    General: Skin is warm and dry.     Coloration: Skin is pale.  Neurological:     General: No focal deficit present.     Mental Status: He is alert. Mental status is at baseline.     Cranial Nerves: No cranial nerve deficit.     Comments: Oriented x 2  Psychiatric:        Mood and Affect: Mood normal.    Labs reviewed: Recent Labs    10/10/20 0943 10/23/20 0858 11/01/20 0000 11/30/20 0000 12/31/20 0000 02/11/21 1438  NA 133* 126*   < > 131* 133* 132*  K 4.4 4.4   < > 4.5 4.4 4.6  CL 100 92*   < > 95* 98* 98  CO2 25 26   < > 23* 25* 27  GLUCOSE 110* 106*  --   --   --  109*  BUN 15 13   < > '18 15 16  '$ CREATININE 0.76 0.79   < > 0.7 0.6 0.78  CALCIUM 9.3 9.0   < > 9.7 8.9 8.9   < > = values in this interval not displayed.   Recent Labs    10/10/20 0943 10/23/20 0858 11/01/20 0000 02/11/21 1438  AST '16 21 17 '$ 11*  ALT '12 17 21 10  '$ ALKPHOS 79 79 89 81  BILITOT 0.6 0.4  --  0.3  PROT 6.5 6.2*  --  6.6  ALBUMIN 3.3* 3.3* 3.6 2.9*   Recent Labs    10/10/20 0943 10/23/20 0858 11/01/20 0000 11/08/20 0000 12/31/20 0000 02/11/21 1438  WBC 6.8 4.6   < > 3.8 5.0 11.8*  NEUTROABS 5.0 3.2  --   --   --  9.6*  HGB 9.9* 9.2*   < >  9.6* 10.3* 9.8*  HCT 31.2* 27.3*   < > 28* 31* 29.6*  MCV 83.0 80.8  --   --   --  88.1   PLT 302 281   < > 185 320 449*   < > = values in this interval not displayed.   Lab Results  Component Value Date   TSH 2.045 02/11/2021   Lab Results  Component Value Date   HGBA1C 5.9 03/09/2012   Lab Results  Component Value Date   CHOL 189 12/16/2018   HDL 69 12/16/2018   LDLCALC 111 12/16/2018   TRIG 45 12/16/2018    Significant Diagnostic Results in last 30 days:  No results found.  Assessment/Plan 1. Weakness Remains ambulatory with unsteady gait which is worse.  Will check labs No signs one sided weakness No hospitalization per goals of care discussion Check CBC and BMP  2. Confusion With low grade temp and weakness Will check covid swab and UA C and S  3. Iron deficiency anemia due to chronic blood loss Has had iron transfusion in the past. WIll recheck CBC due to weakness.   4. Rectal adenocarcinoma ([place]) Remains with a porta cath. No longer going undergoing treatment due to prior intolerance and goals of care.   5. Hyponatremia Prior hx of low sodium not currently on tx WIll recheck due to weakness     Family/ staff Communication: discussed with daughter Justice Deeds, agree to treat reversible issues at [facility]. Could go out for iron infusion if willing  Labs/tests ordered:  CBC BMP Rapid covid UA C and S

## 2021-05-13 NOTE — ED Triage Notes (Signed)
Daughter at bedside, arrived from [facility]. Reported concerns for high blood pressure, high WBC, low HGB and no urine output upon catheterization by staff. Patient denies pain when asked and stated he voided prior to arrival.  ?

## 2021-05-14 ENCOUNTER — Emergency Department (HOSPITAL_BASED_OUTPATIENT_CLINIC_OR_DEPARTMENT_OTHER): Payer: Medicare PPO

## 2021-05-14 ENCOUNTER — Observation Stay (HOSPITAL_BASED_OUTPATIENT_CLINIC_OR_DEPARTMENT_OTHER)
Admission: EM | Admit: 2021-05-14 | Discharge: 2021-05-15 | Disposition: A | Discharge status: Skilled Nursing Facility | Payer: Medicare PPO | Attending: Internal Medicine | Admitting: Internal Medicine

## 2021-05-14 ENCOUNTER — Encounter (HOSPITAL_BASED_OUTPATIENT_CLINIC_OR_DEPARTMENT_OTHER): Payer: Self-pay | Admitting: Emergency Medicine

## 2021-05-14 DIAGNOSIS — Z85828 Personal history of other malignant neoplasm of skin: Secondary | ICD-10-CM | POA: Diagnosis not present

## 2021-05-14 DIAGNOSIS — D509 Iron deficiency anemia, unspecified: Secondary | ICD-10-CM | POA: Diagnosis not present

## 2021-05-14 DIAGNOSIS — D649 Anemia, unspecified: Secondary | ICD-10-CM

## 2021-05-14 DIAGNOSIS — I350 Nonrheumatic aortic (valve) stenosis: Secondary | ICD-10-CM

## 2021-05-14 DIAGNOSIS — L8990 Pressure ulcer of unspecified site, unspecified stage: Secondary | ICD-10-CM | POA: Diagnosis present

## 2021-05-14 DIAGNOSIS — I700 Atherosclerosis of aorta: Secondary | ICD-10-CM | POA: Diagnosis present

## 2021-05-14 DIAGNOSIS — C20 Malignant neoplasm of rectum: Secondary | ICD-10-CM | POA: Diagnosis present

## 2021-05-14 DIAGNOSIS — E871 Hypo-osmolality and hyponatremia: Secondary | ICD-10-CM | POA: Diagnosis present

## 2021-05-14 DIAGNOSIS — G9341 Metabolic encephalopathy: Secondary | ICD-10-CM | POA: Diagnosis present

## 2021-05-14 DIAGNOSIS — Z20822 Contact with and (suspected) exposure to covid-19: Secondary | ICD-10-CM | POA: Diagnosis not present

## 2021-05-14 LAB — COMPREHENSIVE METABOLIC PANEL
ALT: 40 U/L (ref 0–44)
AST: 33 U/L (ref 15–41)
Albumin: 3.1 g/dL — ABNORMAL LOW (ref 3.5–5.0)
Alkaline Phosphatase: 76 U/L (ref 38–126)
Anion gap: 10 (ref 5–15)
BUN: 15 mg/dL (ref 8–23)
CO2: 25 mmol/L (ref 22–32)
Calcium: 9.2 mg/dL (ref 8.9–10.3)
Chloride: 92 mmol/L — ABNORMAL LOW (ref 98–111)
Creatinine, Ser: 0.53 mg/dL — ABNORMAL LOW (ref 0.61–1.24)
GFR, Estimated: >60 mL/min (ref >60)
Glucose, Bld: 103 mg/dL — ABNORMAL HIGH (ref 70–99)
Potassium: 4 mmol/L (ref 3.5–5.1)
Sodium: 127 mmol/L — ABNORMAL LOW (ref 135–145)
Total Bilirubin: 0.4 mg/dL (ref 0.3–1.2)
Total Protein: 6.5 g/dL (ref 6.5–8.1)

## 2021-05-14 LAB — MAGNESIUM: Magnesium: 2.1 mg/dL (ref 1.7–2.4)

## 2021-05-14 LAB — URINALYSIS, ROUTINE W REFLEX MICROSCOPIC
Bilirubin Urine: NEGATIVE
Glucose, UA: NEGATIVE mg/dL
Ketones, ur: NEGATIVE mg/dL
Leukocytes,Ua: NEGATIVE
Nitrite: NEGATIVE
Specific Gravity, Urine: 1.018 (ref 1.005–1.030)
pH: 5.5 (ref 5.0–8.0)

## 2021-05-14 LAB — RESP PANEL BY RT-PCR (FLU A&B, COVID) ARPGX2
Influenza A by PCR: NEGATIVE
Influenza B by PCR: NEGATIVE
SARS Coronavirus 2 by RT PCR: NEGATIVE

## 2021-05-14 LAB — BASIC METABOLIC PANEL
Anion gap: 9 (ref 5–15)
BUN: 12 mg/dL (ref 8–23)
CO2: 26 mmol/L (ref 22–32)
Calcium: 8.4 mg/dL — ABNORMAL LOW (ref 8.9–10.3)
Chloride: 94 mmol/L — ABNORMAL LOW (ref 98–111)
Creatinine, Ser: 0.49 mg/dL — ABNORMAL LOW (ref 0.61–1.24)
GFR, Estimated: >60 mL/min (ref >60)
Glucose, Bld: 92 mg/dL (ref 70–99)
Potassium: 3.7 mmol/L (ref 3.5–5.1)
Sodium: 129 mmol/L — ABNORMAL LOW (ref 135–145)

## 2021-05-14 LAB — CBC
HCT: 24.1 % — ABNORMAL LOW (ref 39.0–52.0)
Hemoglobin: 7.6 g/dL — ABNORMAL LOW (ref 13.0–17.0)
MCH: 25.1 pg — ABNORMAL LOW (ref 26.0–34.0)
MCHC: 31.5 g/dL (ref 30.0–36.0)
MCV: 79.5 fL — ABNORMAL LOW (ref 80.0–100.0)
Platelets: 427 10*3/uL — ABNORMAL HIGH (ref 150–400)
RBC: 3.03 MIL/uL — ABNORMAL LOW (ref 4.22–5.81)
RDW: 15.3 % (ref 11.5–15.5)
WBC: 12.2 10*3/uL — ABNORMAL HIGH (ref 4.0–10.5)
nRBC: 0 % (ref 0.0–0.2)

## 2021-05-14 LAB — PHOSPHORUS: Phosphorus: 3.8 mg/dL (ref 2.5–4.6)

## 2021-05-14 LAB — OCCULT BLOOD X 1 CARD TO LAB, STOOL: Fecal Occult Bld: POSITIVE — AB

## 2021-05-14 LAB — HEMOGLOBIN AND HEMATOCRIT, BLOOD
HCT: 24.6 % — ABNORMAL LOW (ref 39.0–52.0)
HCT: 22 % — ABNORMAL LOW (ref 39.0–52.0)
Hemoglobin: 7.9 g/dL — ABNORMAL LOW (ref 13.0–17.0)
Hemoglobin: 7.1 g/dL — ABNORMAL LOW (ref 13.0–17.0)

## 2021-05-14 LAB — C DIFFICILE QUICK SCREEN W PCR REFLEX
C Diff antigen: NEGATIVE
C Diff interpretation: NOT DETECTED
C Diff toxin: NEGATIVE

## 2021-05-14 IMAGING — DX DG CHEST 1V PORT
1 series · 1 of 1 positions shown · non-contrast
Comparison: [DATE]

CLINICAL DATA: Fevers.

EXAM:
PORTABLE CHEST 1 VIEW

[chest ap]
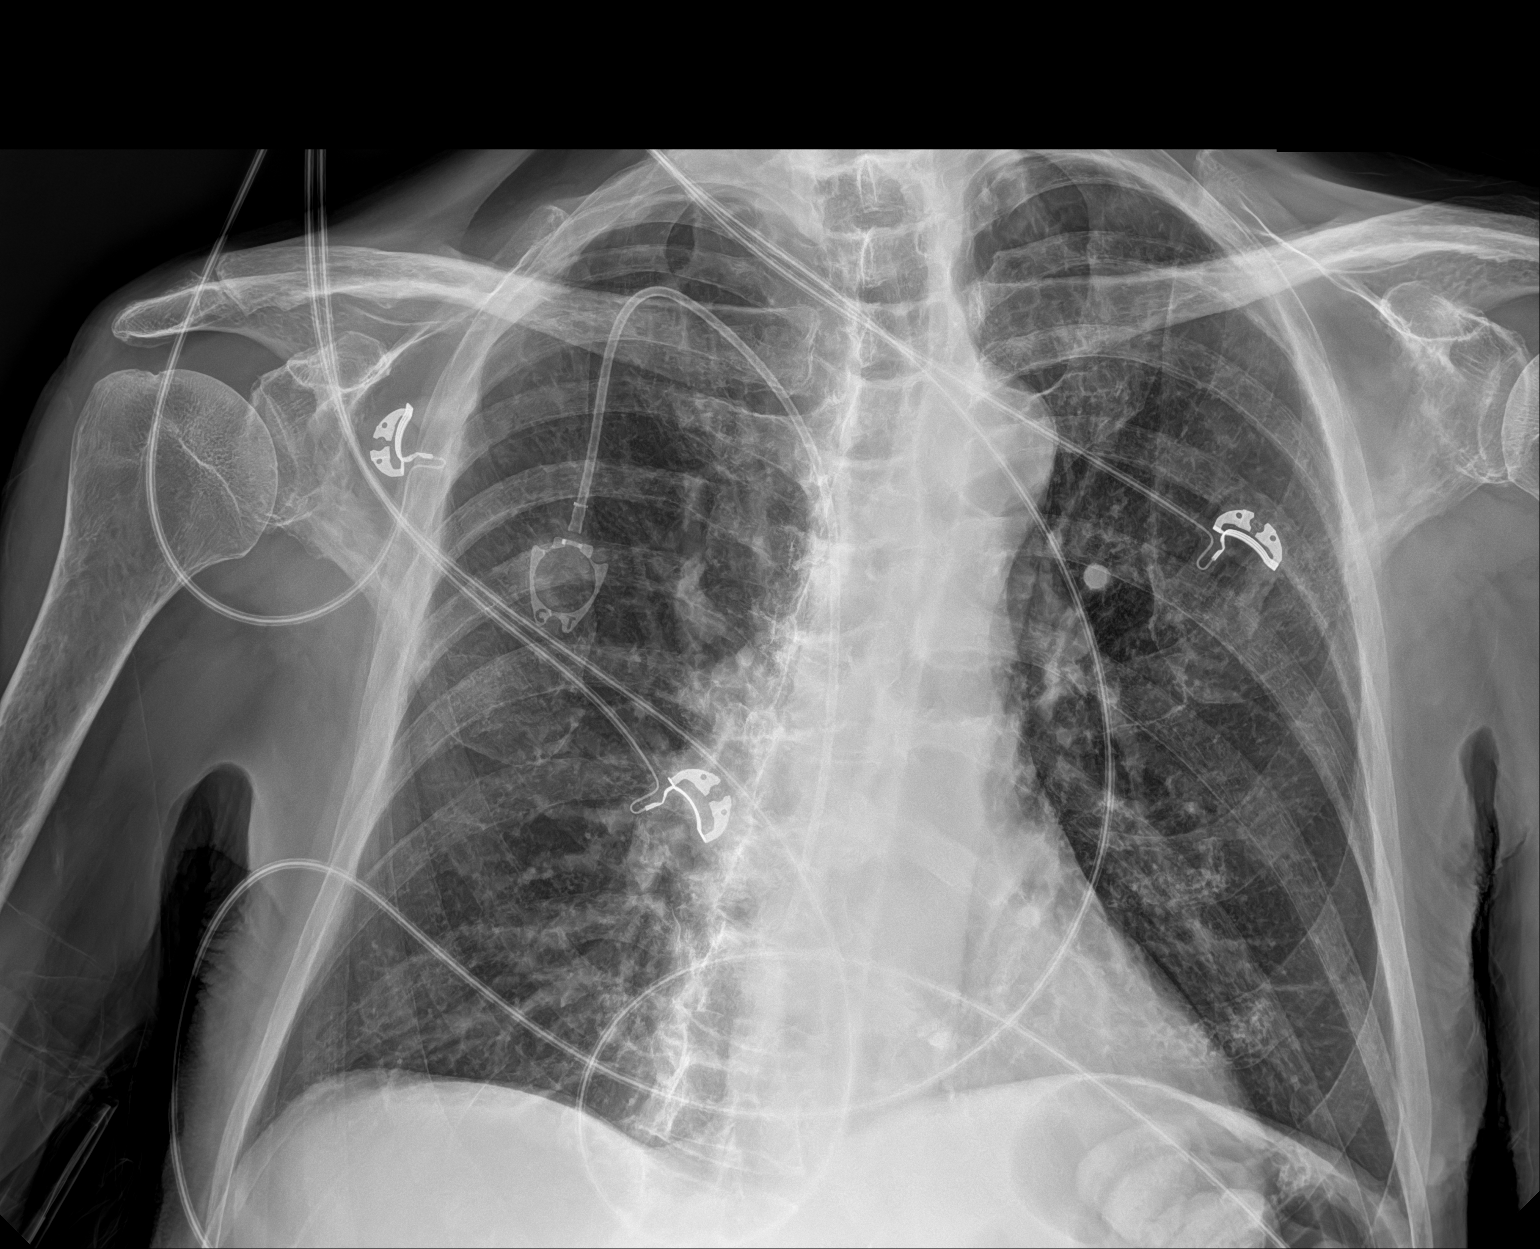

[1 of 1 positions shown; findings below may reference images not displayed]

FINDINGS: Right chest wall port a catheter noted with tip in the projection of
the cavoatrial junction. Heart size is normal. No pleural effusion
or edema. No airspace opacities identified. Visualized osseous
structures are unremarkable.
IMPRESSION: No acute cardiopulmonary abnormalities.

## 2021-05-14 MED ORDER — CHLORHEXIDINE GLUCONATE CLOTH 2 % EX PADS
6.0000 | MEDICATED_PAD | Freq: Every day | CUTANEOUS | Status: DC
  Administered 2021-05-14 – 2021-05-15 (×2): 6 via TOPICAL

## 2021-05-14 MED ORDER — PANTOPRAZOLE SODIUM 40 MG IV SOLR
40.0000 mg | INTRAVENOUS | Status: DC
  Administered 2021-05-14 – 2021-05-15 (×2): 40 mg via INTRAVENOUS
  Filled 2021-05-14 (×2): qty 10

## 2021-05-14 MED ORDER — SODIUM CHLORIDE 0.9 % IV SOLN: INTRAVENOUS | Status: DC

## 2021-05-14 MED ORDER — SODIUM CHLORIDE 0.9% FLUSH: 10.0000 mL | INTRAVENOUS | Status: DC | PRN

## 2021-05-14 MED ORDER — ACETAMINOPHEN 650 MG RE SUPP: 650.0000 mg | Freq: Four times a day (QID) | RECTAL | Status: DC | PRN

## 2021-05-14 MED ORDER — SODIUM CHLORIDE 0.9 % IV SOLN: Freq: Once | INTRAVENOUS | Status: AC

## 2021-05-14 MED ORDER — ONDANSETRON HCL 4 MG PO TABS: 4.0000 mg | ORAL_TABLET | Freq: Four times a day (QID) | ORAL | Status: DC | PRN

## 2021-05-14 MED ORDER — ACETAMINOPHEN 325 MG PO TABS: 650.0000 mg | ORAL_TABLET | Freq: Four times a day (QID) | ORAL | Status: DC | PRN

## 2021-05-14 MED ORDER — ONDANSETRON HCL 4 MG/2ML IJ SOLN
4.0000 mg | Freq: Four times a day (QID) | INTRAMUSCULAR | Status: DC | PRN
Start: 2021-05-14 — End: 2021-05-16

## 2021-05-14 NOTE — ED Notes (Signed)
Call patient's daughter, [name] when patient has a room assigned.  ?

## 2021-05-14 NOTE — ED Notes (Signed)
Pt woken when entered room. Pt asked to keep lights on and then went back to sleep. Vitals WDL ?

## 2021-05-14 NOTE — Plan of Care (Signed)
New admission, transfer from [facility].  ?Aox4, but periods of confusion with forgetfulness.  ?Await orders from admitting attending.  ? ?Problem: Education: ?Goal: Knowledge of General Education information will improve ?Description: Including pain rating scale, medication(s)/side effects and non-pharmacologic comfort measures ?Outcome: Progressing ?  ?Problem: Health Behavior/Discharge Planning: ?Goal: Ability to manage health-related needs will improve ?Outcome: Progressing ?  ?Problem: Clinical Measurements: ?Goal: Ability to maintain clinical measurements within normal limits will improve ?Outcome: Progressing ?Goal: Will remain free from infection ?Outcome: Progressing ?Goal: Diagnostic test results will improve ?Outcome: Progressing ?Goal: Respiratory complications will improve ?Outcome: Progressing ?Goal: Cardiovascular complication will be avoided ?Outcome: Progressing ?  ?Problem: Activity: ?Goal: Risk for activity intolerance will decrease ?Outcome: Progressing ?  ?Problem: Nutrition: ?Goal: Adequate nutrition will be maintained ?Outcome: Progressing ?  ?Problem: Coping: ?Goal: Level of anxiety will decrease ?Outcome: Progressing ?  ?Problem: Elimination: ?Goal: Will not experience complications related to bowel motility ?Outcome: Progressing ?Goal: Will not experience complications related to urinary retention ?Outcome: Progressing ?  ?Problem: Pain Managment: ?Goal: General experience of comfort will improve ?Outcome: Progressing ?  ?Problem: Safety: ?Goal: Ability to remain free from injury will improve ?Outcome: Progressing ?  ?Problem: Skin Integrity: ?Goal: Risk for impaired skin integrity will decrease ?Outcome: Progressing ?  ?

## 2021-05-14 NOTE — Progress Notes (Addendum)
Plan of Care Note for accepted transfer ? ? ?Patient: Alec Weiss MRN: [id number]   DOA: 05/14/2021 ? ?Facility requesting transfer: [facility] ?Requesting Provider: Dr. Florina Ou ?Reason for transfer: Acute Metabolic Encephalopathy ?Facility course:  ? ?88 year old male who is a resident of [facility] with past medical history of adenocarcinoma of the rectum, iron deficiency anemia secondary to chronic blood loss secondary to known adenocarcinoma, MGUS, chronic low back pain who presents to [place] with family due to progressively worsening confusion and weakness as well as new leukocytosis and worsening anemia according to assisted-living facility staff. ? ?Upon evaluation in the emergency department patient was found to have a hemoglobin of 8.2, down from previous value in our system of 9.07 February 2021.  Patient also noted to have a hemoglobin of 14.6 with chemistry additionally revealing acute on chronic hyponatremia of 127.  Patient found to be COVID-19 and influenza negative.  Urinalysis found to be unremarkable. ? ?ER provider requested hospitalization for work-up and management of ongoing encephalopathy.  I have requested a CXR which is pending.   ? ?Plan of care: ?The patient is accepted for admission to Telemetry unit, at [facility] or [facility] based on bed availability.  ? ? ?Author: ?Vernelle Emerald, MD ?05/14/2021 ? ?Check [email or web address] for on-call coverage. ? ?Nursing staff, Please call [place] number on Amion as soon as patient's arrival, so appropriate admitting provider can evaluate the pt. ?

## 2021-05-14 NOTE — ED Notes (Signed)
Patient ambulated to the restroom with one person assist.  ?

## 2021-05-14 NOTE — ED Notes (Signed)
Patient requested to have daughter called. Daughter, [name], was called and given the unit phone number and patient's direct phone number to assigned room at [facility]. Daughter also aware that [facility] was at DB transferring patient at the time of phone call.   ?

## 2021-05-14 NOTE — H&P (Signed)
?History and Physical  ? ? ?Patient: Alec Weiss DOB: 05/05/1926 ?DOA: 05/14/2021 ?DOS: the patient was seen and examined on 05/14/2021 ?PCP: [name], MD  ?Patient coming from: SNF. ? ?Chief Complaint:  ?Chief Complaint  ?Patient presents with  ? Abnormal Lab  ? ?HPI: Alec Weiss is a 88 y.o. male with medical history significant of actinic keratosis, insomnia, lumbago.  Basal cell CVA, retinal detachment of the left eye, aortic atherosclerosis, aortic stenosis, grade 1 diastolic dysfunction, vitamin D deficiency who [facility] due to concerns for elevated blood pressure, leukocytosis, rectal bleed with worsening of chronic anemia in the setting of rectal adenocarcinoma.  He has been drinking fluids.  No fever, chills, sore throat, rhinorrhea, wheezing or hemoptysis.  No chest pain, palpitations, diaphoresis, PND, he occasionally gets dyspneic.  Positive hematochezia.  Denied abdominal pain, nausea or emesis.  No flank pain, dysuria, frequency or hematuria. ? ?ED course: Initial vital signs were temperature 98 ?F, pulse 96, depression 18, BP 142/91 mmHg O2 sat 98% on room air.  The patient received 125 mL of normal saline in the emergency department. ? ?Lab work: His urinalysis showed trace hemoglobinuria and trace proteinuria with rare bacteria microscopic examination.  Fecal occult blood was positive.  CBC showed a white count of 14.6 with 82% neutrophils, hemoglobin 8.2 g/dL platelets 453.  CMP with a sodium of 127 and chloride 92 mmol/L.  Nonfasting glucose 103 and creatinine 0.53 mg/dL.  Albumin was 3.1 g/dL.  The rest of the CMP measurements were unremarkable. ? ?Imaging: Portable 1 view chest radiograph showed no acute cardiopulmonary abnormalities. ? ?Review of Systems: As mentioned in the history of present illness. All other systems reviewed and are negative. ?Past Medical History:  ?Diagnosis Date  ? Actinic keratosis 04/05/2012  ? Insomnia, unspecified 06/08/2008  ?  Lumbago 01/02/2003  ? Neoplasm of uncertain behavior of skin 04/05/2012  ? Pain in limb 02/05/2011  ? Retinal detachment 2000  ? left eye  ? Screening cholesterol level   ? Shortness of breath 09/07/2008  ? Vitamin D deficiency 08/04/2012  ? ?Past Surgical History:  ?Procedure Laterality Date  ? BIOPSY  08/23/2020  ? Procedure: BIOPSY;  Surgeon: Doran Stabler, MD;  Location: [facility];  Service: Gastroenterology;;  ? COLONOSCOPY WITH PROPOFOL N/A 08/23/2020  ? Procedure: COLONOSCOPY WITH PROPOFOL;  Surgeon: Doran Stabler, MD;  Location: [facility];  Service: Gastroenterology;  Laterality: N/A;  ? ESOPHAGOGASTRODUODENOSCOPY (EGD) WITH PROPOFOL N/A 08/23/2020  ? Procedure: ESOPHAGOGASTRODUODENOSCOPY (EGD) WITH PROPOFOL;  Surgeon: Doran Stabler, MD;  Location: [facility];  Service: Gastroenterology;  Laterality: N/A;  ? EXTERNAL EAR SURGERY  05/2012  ? left ear   ? INGUINAL HERNIA REPAIR Right 01/21/2005  ? Dr. [name]  ? IR IMAGING GUIDED PORT INSERTION  10/09/2020  ? LID LESION EXCISION  2009  ? MOHS SURGERY Left 05/28/2012  ? ear lobe Dr. [name]  ? RETINAL DETACHMENT SURGERY Left 1997  ? [place] INJECTION  08/23/2020  ? Procedure: SUBMUCOSAL INJECTION;  Surgeon: Doran Stabler, MD;  Location: [facility];  Service: Gastroenterology;;  ? ?Social History:  reports that he has never smoked. He has never used smokeless tobacco. He reports current alcohol use of about 1.0 standard drink per week. He reports that he does not use drugs. ? ?Allergies  ?Allergen Reactions  ? Benzalkonium Chloride Rash  ?  Very allergic per patient   ? Maxitrol [  Neomycin-Polymyxin-Dexameth] Itching and Rash  ? Neosporin [Neomycin-Bacitracin Zn-Polymyx] Itching and Rash  ? ? ?Family History  ?Problem Relation Age of Onset  ? Breast cancer Mother   ?     dx after 10  ? ? ?Prior to Admission medications   ?Medication Sig Start Date End Date Taking? Authorizing Provider  ?atropine 1 % ophthalmic solution Place 4  drops under the tongue every 6 (six) hours as needed. Prn secretions    [provider]  ?bisacodyl (DULCOLAX) 10 MG suppository Place 10 mg rectally as needed for moderate constipation.    [provider]  ?loperamide (IMODIUM A-D) 2 MG tablet Take 2 mg by mouth 2 (two) times daily as needed for diarrhea or loose stools.    [provider]  ?melatonin 3 MG TABS tablet Take 3 mg by mouth at bedtime.    [provider]  ?morphine (ROXANOL) 20 MG/ML concentrated solution Take 0.25 mLs (5 mg total) by mouth every 4 (four) hours as needed for severe pain. 11/30/20   Royal Hawthorn, NP  ?ondansetron (ZOFRAN) 8 MG tablet Take 1 tablet (8 mg total) by mouth 2 (two) times daily as needed for refractory nausea / vomiting. Start on day 3 after chemotherapy. 09/20/20   Benay Pike, MD  ?Polyethyl Glycol-Propyl Glycol (SYSTANE) 0.4-0.3 % SOLN Apply 2 drops to eye 3 (three) times daily as needed.    [provider]  ? ? ?Physical Exam: ?Vitals:  ? 05/14/21 0300 05/14/21 0400 05/14/21 0430 05/14/21 0830  ?BP: 140/88 (!) 151/92 138/78 (!) 143/92  ?Pulse: 79 83 77 82  ?Resp:   20 18  ?Temp:    98.6 ?F (37 ?C)  ?TempSrc:    Oral  ?SpO2: 100% 98% 97% 98%  ?Weight:      ?Height:      ? ?Physical Exam ?Vitals and nursing note reviewed.  ?Constitutional:   ?   Appearance: Normal appearance.  ?HENT:  ?   Head: Normocephalic.  ?   Mouth/Throat:  ?   Mouth: Mucous membranes are moist.  ?Eyes:  ?   Pupils: Pupils are equal, round, and reactive to light.  ?Cardiovascular:  ?   Rate and Rhythm: Normal rate and regular rhythm.  ?   Heart sounds: Murmur heard.  ?Crescendo systolic murmur is present with a grade of 2/6.  ?Pulmonary:  ?   Effort: Pulmonary effort is normal.  ?   Breath sounds: Normal breath sounds.  ?Abdominal:  ?   General: There is no distension.  ?   Palpations: Abdomen is soft.  ?   Tenderness: There is no abdominal tenderness.  ?Musculoskeletal:  ?   Cervical back: Neck supple.   ?   Right lower leg: No edema.  ?   Left lower leg: No edema.  ?   Comments: Severe generalized weakness.  ?Skin: ?   General: Skin is warm and dry.  ?Neurological:  ?   General: No focal deficit present.  ?   Mental Status: He is alert and oriented to person, place, and time.  ?   Cranial Nerves: Cranial nerves 2-12 are intact.  ?   Motor: Tremor present.  ?   Comments: Positive tremor.  ?Psychiatric:     ?   Mood and Affect: Mood normal.     ?   Cognition and Memory: He exhibits impaired recent memory.  ?   Comments: Short-term memory impairment has been noticed by the patient's daughter in recent weeks.  ? ? ?  Data Reviewed: ? ?There are no new results to review at this time. ? ?Assessment and Plan: ?Principal Problem: ?  Acute metabolic encephalopathy ?In the setting of ?Hyponatremia/symptomatic iron deficiency anemia ?Observation/telemetry. ?Supportive care and orientation as needed. ?Continue IV fluids. ?Fluid restriction. ?Follow-up H&H and sodium. ?Transfuse as needed. ? ?Active Problems: ?  Adenocarcinoma of rectum ([place]) ?Consult palliative care. ? ?  Mild aortic stenosis by prior echocardiogram ?No clinical signs of decompensation. ? ?  Aortic atherosclerosis ([place]) ?No treatment desired given advanced age. ? ? ? ? ? Advance Care Planning: DNR. ? ?Consults: Gastroenterology (Dr. Silverio Decamp) ? ?Family Communication: I spoke to and updated his daughter [name]. ? ?Severity of Illness: ?The appropriate patient status for this patient is OBSERVATION. Observation status is judged to be reasonable and necessary in order to provide the required intensity of service to ensure the patient's safety. The patient's presenting symptoms, physical exam findings, and initial radiographic and laboratory data in the context of their medical condition is felt to place them at decreased risk for further clinical deterioration. Furthermore, it is anticipated that the patient will be medically stable for discharge from the  hospital within 2 midnights of admission.  ? ?Author: ?Reubin Milan, MD ?05/14/2021 9:22 AM ? ?For on call review [email or web address].  ? ?This document was prepared using Systems analyst and may

## 2021-05-14 NOTE — Progress Notes (Signed)
Pt with orders for frequent blood draws and IVF.  ?Okay to access port per attending MD.  ?Order placed for IV Team for access.  ?

## 2021-05-14 NOTE — ED Provider Notes (Signed)
[facility] Provider Note: Lowella Dell, MD, FACEP  CSN: [id number] MRN: [id number] ARRIVAL: 05/13/21 at 2242 ROOM: DB016/DB016   CHIEF COMPLAINT  Abnormal Lab   HISTORY OF PRESENT ILLNESS  05/14/21 3:32 AM Alec Weiss is a 88 y.o. male with known colon cancer not currently on chemotherapy (he was unable to tolerate).  He has had increased weakness and confusion over the past several days.  Lab work was done at his nursing home and he was found to be anemic and hyponatremic.  He denies any pain.  Staff was also concerned about decreased urine output but he was able to urinate in the ED.     Past Medical History:  Diagnosis Date   Actinic keratosis 04/05/2012   Insomnia, unspecified 06/08/2008   Lumbago 01/02/2003   Neoplasm of uncertain behavior of skin 04/05/2012   Pain in limb 02/05/2011   Retinal detachment 2000   left eye   Screening cholesterol level    Shortness of breath 09/07/2008   Vitamin D deficiency 08/04/2012    Past Surgical History:  Procedure Laterality Date   BIOPSY  08/23/2020   Procedure: BIOPSY;  Surgeon: Sherrilyn Rist, MD;  Location: [facility];  Service: Gastroenterology;;   COLONOSCOPY WITH PROPOFOL N/A 08/23/2020   Procedure: COLONOSCOPY WITH PROPOFOL;  Surgeon: Sherrilyn Rist, MD;  Location: [facility];  Service: Gastroenterology;  Laterality: N/A;   ESOPHAGOGASTRODUODENOSCOPY (EGD) WITH PROPOFOL N/A 08/23/2020   Procedure: ESOPHAGOGASTRODUODENOSCOPY (EGD) WITH PROPOFOL;  Surgeon: Sherrilyn Rist, MD;  Location[facility];  Service: Gastroenterology;  Laterality: N/A;   EXTERNAL EAR SURGERY  05/2012   left ear    INGUINAL HERNIA REPAIR Right 01/21/2005   Dr. Maryagnes Amos   IR IMAGING GUIDED PORT INSERTION  10/09/2020   LID LESION EXCISION  2009   MOHS SURGERY Left 05/28/2012   ear lobe Dr. Koleen Nimrod   RETINAL DETACHMENT SURGERY Left 1997   [facility]   SUBMUCOSAL INJECTION  08/23/2020   Procedure: SUBMUCOSAL INJECTION;  Surgeon: Sherrilyn Rist, MD;  Location: [facility];  Service: Gastroenterology;;    Family History  Problem Relation Age of Onset   Breast cancer Mother        dx after 64    Social History   Tobacco Use   Smoking status: Never   Smokeless tobacco: Never  Vaping Use   Vaping Use: Never used  Substance Use Topics   Alcohol use: Yes    Alcohol/week: 1.0 standard drink    Types: 1 Glasses of wine per week    Comment: at dinner every night.   Drug use: No    Prior to Admission medications   Medication Sig Start Date End Date Taking? Authorizing Provider  atropine 1 % ophthalmic solution Place 4 drops under the tongue every 6 (six) hours as needed. Prn secretions    [provider]  bisacodyl (DULCOLAX) 10 MG suppository Place 10 mg rectally as needed for moderate constipation.    [provider]  loperamide (IMODIUM A-D) 2 MG tablet Take 2 mg by mouth 2 (two) times daily as needed for diarrhea or loose stools.    [provider]  melatonin 3 MG TABS tablet Take 3 mg by mouth at bedtime.    [provider]  morphine (ROXANOL) 20 MG/ML concentrated solution Take 0.25 mLs (5 mg total) by mouth every 4 (four) hours as needed for severe pain. 11/30/20   Fletcher Anon, NP  ondansetron (  ZOFRAN) 8 MG tablet Take 1 tablet (8 mg total) by mouth 2 (two) times daily as needed for refractory nausea / vomiting. Start on day 3 after chemotherapy. 09/20/20   Rachel Moulds, MD  Polyethyl Glycol-Propyl Glycol (SYSTANE) 0.4-0.3 % SOLN Apply 2 drops to eye 3 (three) times daily as needed.    [provider]    Allergies Benzalkonium chloride, Maxitrol [neomycin-polymyxin-dexameth], and Neosporin [neomycin-bacitracin zn-polymyx]   REVIEW OF SYSTEMS  Negative except as noted here or in the History of Present Illness.   PHYSICAL EXAMINATION  Initial Vital Signs Blood pressure (!) 152/84, pulse 93, temperature 98 F (36.7 C), resp. rate 19, height 5\' 8"  (1.727  m), weight 61.9 kg, SpO2 100 %.  Examination General: Well-developed, cachectic male in no acute distress; appearance consistent with age of record HENT: normocephalic; atraumatic Eyes: pupils equal, round and reactive to light; extraocular muscles intact; pale conjunctivae Neck: supple Heart: regular rate and rhythm; systolic murmur loudest at the apex Lungs: clear to auscultation bilaterally Abdomen: soft; distended; nontender; bowel sounds present Rectal: Normal sphincter tone; stool sent for Hemoccult testing Extremities: No deformity; full range of motion; pulses normal Neurologic: Awake, alert and oriented x 2; motor function intact in all extremities and symmetric; no facial droop Skin: Warm and dry Psychiatric: Flat affect   RESULTS  Summary of this visit's results, reviewed and interpreted by myself:   EKG Interpretation  Date/Time:    Ventricular Rate:    PR Interval:    QRS Duration:   QT Interval:    QTC Calculation:   R Axis:     Text Interpretation:         Laboratory Studies: Results for orders placed or performed during the hospital encounter of 05/14/21 (from the past 24 hour(s))  CBC with Differential     Status: Abnormal   Collection Time: 05/13/21 11:20 PM  Result Value Ref Range   WBC 14.6 (H) 4.0 - 10.5 K/uL   RBC 3.35 (L) 4.22 - 5.81 MIL/uL   Hemoglobin 8.2 (L) 13.0 - 17.0 g/dL   HCT 16.1 (L) 09.6 - 04.5 %   MCV 77.6 (L) 80.0 - 100.0 fL   MCH 24.5 (L) 26.0 - 34.0 pg   MCHC 31.5 30.0 - 36.0 g/dL   RDW 40.9 81.1 - 91.4 %   Platelets 453 (H) 150 - 400 K/uL   nRBC 0.0 0.0 - 0.2 %   Neutrophils Relative % 82 %   Neutro Abs 12.0 (H) 1.7 - 7.7 K/uL   Lymphocytes Relative 8 %   Lymphs Abs 1.1 0.7 - 4.0 K/uL   Monocytes Relative 10 %   Monocytes Absolute 1.5 (H) 0.1 - 1.0 K/uL   Eosinophils Relative 0 %   Eosinophils Absolute 0.0 0.0 - 0.5 K/uL   Basophils Relative 0 %   Basophils Absolute 0.0 0.0 - 0.1 K/uL   Immature Granulocytes 0 %   Abs  Immature Granulocytes 0.06 0.00 - 0.07 K/uL  Comprehensive metabolic panel     Status: Abnormal   Collection Time: 05/13/21 11:20 PM  Result Value Ref Range   Sodium 127 (L) 135 - 145 mmol/L   Potassium 4.0 3.5 - 5.1 mmol/L   Chloride 92 (L) 98 - 111 mmol/L   CO2 25 22 - 32 mmol/L   Glucose, Bld 103 (H) 70 - 99 mg/dL   BUN 15 8 - 23 mg/dL   Creatinine, Ser 7.82 (L) 0.61 - 1.24 mg/dL   Calcium 9.2 8.9 - 10.3  mg/dL   Total Protein 6.5 6.5 - 8.1 g/dL   Albumin 3.1 (L) 3.5 - 5.0 g/dL   AST 33 15 - 41 U/L   ALT 40 0 - 44 U/L   Alkaline Phosphatase 76 38 - 126 U/L   Total Bilirubin 0.4 0.3 - 1.2 mg/dL   GFR, Estimated >87 >56 mL/min   Anion gap 10 5 - 15  Urinalysis, Routine w reflex microscopic Urine, Clean Catch     Status: Abnormal   Collection Time: 05/14/21  2:40 AM  Result Value Ref Range   Color, Urine YELLOW YELLOW   APPearance CLEAR CLEAR   Specific Gravity, Urine 1.018 1.005 - 1.030   pH 5.5 5.0 - 8.0   Glucose, UA NEGATIVE NEGATIVE mg/dL   Hgb urine dipstick TRACE (A) NEGATIVE   Bilirubin Urine NEGATIVE NEGATIVE   Ketones, ur NEGATIVE NEGATIVE mg/dL   Protein, ur TRACE (A) NEGATIVE mg/dL   Nitrite NEGATIVE NEGATIVE   Leukocytes,Ua NEGATIVE NEGATIVE   RBC / HPF 11-20 0 - 5 RBC/hpf   WBC, UA 0-5 0 - 5 WBC/hpf   Bacteria, UA RARE (A) NONE SEEN   Mucus PRESENT   Occult blood card to lab, stool     Status: Abnormal   Collection Time: 05/14/21  3:45 AM  Result Value Ref Range   Fecal Occult Bld POSITIVE (A) NEGATIVE  Resp Panel by RT-PCR (Flu A&B, Covid) Nasopharyngeal Swab     Status: None   Collection Time: 05/14/21  3:46 AM   Specimen: Nasopharyngeal Swab; Nasopharyngeal(NP) swabs in vial transport medium  Result Value Ref Range   SARS Coronavirus 2 by RT PCR NEGATIVE NEGATIVE   Influenza A by PCR NEGATIVE NEGATIVE   Influenza B by PCR NEGATIVE NEGATIVE   Imaging Studies: No results found.  ED COURSE and MDM  Nursing notes, initial and subsequent vitals signs,  including pulse oximetry, reviewed and interpreted by myself.  Vitals:   05/14/21 0245 05/14/21 0300 05/14/21 0400 05/14/21 0430  BP: (!) 152/84 140/88 (!) 151/92 138/78  Pulse: 93 79 83 77  Resp: 19   20  Temp:      SpO2: 100% 100% 98% 97%  Weight:      Height:       Medications  0.9 %  sodium chloride infusion ( Intravenous New Bag/Given 05/14/21 0359)   4:30 AM The patient has known colon cancer and is heme positive rectally.  I suspect he is losing blood which is the cause of his now symptomatic anemia.  We will have him admitted for likely transfusion and rehydration.  The cause of his leukocytosis is less clear.  His nursing home reports a low-grade fever but he is negative for COVID and influenza, has no respiratory symptoms, and his urine is not showing a urinary tract infection.  5:01 AM Dr. Leafy Half excepts for admission to the hospitalist service.   PROCEDURES  Procedures   ED DIAGNOSES     ICD-10-CM   1. Symptomatic anemia  D64.9     2. Hyponatremia  E87.1          Delona Clasby, Jonny Ruiz, MD 05/14/21 [phone]

## 2021-05-15 DIAGNOSIS — Z7189 Other specified counseling: Secondary | ICD-10-CM | POA: Diagnosis not present

## 2021-05-15 DIAGNOSIS — D509 Iron deficiency anemia, unspecified: Secondary | ICD-10-CM

## 2021-05-15 DIAGNOSIS — C20 Malignant neoplasm of rectum: Secondary | ICD-10-CM | POA: Diagnosis not present

## 2021-05-15 DIAGNOSIS — Z515 Encounter for palliative care: Secondary | ICD-10-CM | POA: Diagnosis not present

## 2021-05-15 LAB — BASIC METABOLIC PANEL
Anion gap: 7 (ref 5–15)
BUN: 11 mg/dL (ref 8–23)
CO2: 26 mmol/L (ref 22–32)
Calcium: 8.3 mg/dL — ABNORMAL LOW (ref 8.9–10.3)
Chloride: 94 mmol/L — ABNORMAL LOW (ref 98–111)
Creatinine, Ser: 0.55 mg/dL — ABNORMAL LOW (ref 0.61–1.24)
GFR, Estimated: >60 mL/min (ref >60)
Glucose, Bld: 119 mg/dL — ABNORMAL HIGH (ref 70–99)
Potassium: 3.6 mmol/L (ref 3.5–5.1)
Sodium: 127 mmol/L — ABNORMAL LOW (ref 135–145)

## 2021-05-15 LAB — PREPARE RBC (CROSSMATCH)

## 2021-05-15 MED ORDER — SODIUM CHLORIDE 0.9% IV SOLUTION: Freq: Once | INTRAVENOUS | Status: AC

## 2021-05-15 MED ORDER — HEPARIN SOD (PORK) LOCK FLUSH 100 UNIT/ML IV SOLN
500.0000 [IU] | INTRAVENOUS | Status: DC | PRN
  Filled 2021-05-15: qty 5

## 2021-05-15 MED ORDER — HEPARIN SOD (PORK) LOCK FLUSH 100 UNIT/ML IV SOLN
500.0000 [IU] | INTRAVENOUS | Status: DC
  Administered 2021-05-15: 500 [IU]
  Filled 2021-05-15: qty 5

## 2021-05-15 NOTE — TOC Progression Note (Signed)
Transition of Care (TOC) - Progression Note  ? ? ?Patient Details  ?Name: Alec Weiss ?MRN: [id number] ?Date of Birth: 09/04/1926 ? ?Transition of Care (TOC) CM/SW Contact  ?[place], LCSW ?Phone Number: ?05/15/2021, 2:35 PM ? ?Clinical Narrative:    ? ?CSW informed that pt from [facility] and that daughter is interested in hospice at [facility]. CSW met with pt daughter outside of room. Discussed process of arranging hospice. Daughter is unsure about hospice right now. She states she does want pt to return back to [place] and states that [facility] would be able to arrange hospice if family decides that in the future.  ? ?CSW called [facility] and left message with Butch Penny requesting call back.  ? ?Expected Discharge Plan: [place] ?Barriers to Discharge: No Barriers Identified ? ?Expected Discharge Plan and Services ?Expected Discharge Plan: [place] ?  ?  ?  ?Living arrangements for the past 2 months: [place] ?                ?  ?  ?  ?  ?  ?  ?  ?  ?  ?  ? ? ?Social Determinants of Health (SDOH) Interventions ?  ? ?Readmission Risk Interventions ?No flowsheet data found. ? ?

## 2021-05-15 NOTE — TOC Transition Note (Signed)
Transition of Care (TOC) - CM/SW Discharge Note ? ? ?Patient Details  ?Name: Alec Weiss ?MRN: [id number] ?Date of Birth: 20-Aug-1926 ? ?Transition of Care (TOC) CM/SW Contact:  ?[place], LCSW ?Phone Number: ?05/15/2021, 4:33 PM ? ? ?Clinical Narrative:    ? ?Patient will DC to: [facility] ?Anticipated DC date: 05/15/21 ?Family notified: Daughter [name] ?Transport by: Corey Harold ? ? ?Per MD patient ready for DC to [facility]. RN, patient, patient's family, and facility notified of DC. Discharge Summary and FL2 sent to facility. RN to call report prior to discharge [phone] [facility] 2 Room 143). DC packet on chart. Ambulance transport requested for patient. Pt currently receiving Blood and PTAR scheduled for 8pm. [facility] initially informed CSW that pt would need to arrive before 9pm. CSW explained this to pt's daughter who was concerned that pt wouldn't be able to return today. She called Retail buyer at [facility] who discussed with [place] and daughter on speaker phone. They agreed to allow pt to arrive by 10pm but if PTAR is not at hospital for pickup by 915p transport would need to be rescheduled for the AM. CSW notified RN and provided PTAR #'s [phone]. ? ?Please consult [place] again if new needs arise.  ? ? ?Final next level of care: [place] ?Barriers to Discharge: No Barriers Identified ? ? ?Patient Goals and CMS Choice ?  ?  ?  ? ?Discharge Placement ?  ?           ?  ?  ?  ?  ? ?Discharge Plan and Services ?  ?  ?           ?  ?  ?  ?  ?  ?  ?  ?  ?  ?  ? ?Social Determinants of Health (SDOH) Interventions ?  ? ? ?Readmission Risk Interventions ?No flowsheet data found. ? ? ? ? ?

## 2021-05-15 NOTE — Progress Notes (Signed)
Discharged patient to [facility] via [facility] [place]. Discharge instructions reviewed and all questions were answered with PTAR and Beola Cord, RN.  PIV removed per policy.  ?

## 2021-05-15 NOTE — Care Management Obs Status (Signed)
MEDICARE OBSERVATION STATUS NOTIFICATION ? ? ?Patient Details  ?Name: Alec Weiss ?MRN: [id number] ?Date of Birth: [birth year] ? ?Explained MOON to pt daughter. She signed MOON.  ? ? ?Medicare Observation Status Notification Given:  Yes ? ? ? ?[place], LCSW ?05/15/2021, 2:29 PM ?

## 2021-05-15 NOTE — Consult Note (Signed)
? ?                                                                                ?Consultation Note ?Date: 05/15/2021  ? ?Patient Name: Alec Weiss  ?DOB: 06-Jul-1926  MRN: [id number]  Age / Sex: 88 y.o., male  ?PCP: [name], MD ?Referring Physician: Debbe Odea, MD ? ?Reason for Consultation: Establishing goals of care ? ?HPI/Patient Profile: 88 y.o. male  with past medical history of actinic keratosis, insomnia, lumbago, retinal detachment of the left eye, aortic atherosclerosis, aortic stenosis, grade 1 diastolic dysfunction, vitamin D deficiency, underlying memory loss admitted on 05/14/2021 with elevated blood pressure, leukocytosis, rectal bleed with worsening anemia in setting of rectal adenocarcinoma. Goals of care are for comfort and no rehospitalization per note from Royal Hawthorn, NP 05/13/21.  ? ?Clinical Assessment and Goals of Care: ?I met today with Mr. Alec Weiss and daughter/HCPOA Alec Weiss. Alec Weiss is pleasant but seems tired of having conversations and discussing plans so I tried to keep the interaction brief. His wishes were clearly stated to get out of the hospital and return to [facility] asap. They do wish to proceed with blood transfusion prior to discharge but plan to return to [place] today after transfusion. He confirms that he does NOT want to be rehospitalized but to receive care at [facility] and focus on his comfort. I discussed with them the benefits of hospice support as a safety net and even a support to the staff at [facility] just in case he has sudden changes or symptoms that they are the experts to ensure that he gets what he needs when these changes occur and this is why it is helpful to have them already on board before he gets into trouble so they will be available to assist. They understand. Alec Weiss wants to consider hospice once he returns to [place].  ? ?I also spoke with them about updating MOST form as the last we have on file is from 2018 and not  aligned with his stated wishes. I completed new MOST: DNR, comfort measures with no rehospitalization, consider limitations/benefits of antibiotics at time of infection, consider trial of IVF, NO feeding tube. They would be potentially open to conservative treatment that can be provided at [facility] depending on situation but does not want to return to hospital. Made copy for daughter to have on file. DNR and MOST form on chart and to be given to patient at time of discharge. Updated medical team Dr. Wynelle Cleveland as well as Dr. Lyndel Safe and Royal Hawthorn, NP who follow at [facility].  ? ?All questions/concerns addressed. Emotional support provided.  ? ?Primary Decision Maker ?PATIENT ?  ? ?SUMMARY OF RECOMMENDATIONS   ?- DNR ?- MOST completed ?- Return to [place] after blood transfusion ? ?Code Status/Advance Care Planning: ?DNR ? ? ?Symptom Management:  ?Shortness of breath or pain: Roxanol 5 mg SL every hour as needed. Would recommend to have available as needed as he could be symptomatic if he has recurrent anemia.  ? ?Palliative Prophylaxis:  ?Bowel Regimen, Delirium Protocol, Frequent Pain Assessment, Oral Care, and Turn Reposition ? ?Prognosis:  ?Prognosis poor with underlying malignant disease and goals  for comfort focused care. < 6 months likely.  ? ?Discharge Planning: Return to [place]. Considering the addition of hospice support.  ? ?  ? ?Primary Diagnoses: ?Present on Admission: ? Acute metabolic encephalopathy ? Iron deficiency anemia ? Adenocarcinoma of rectum ([place]) ? Hyponatremia ? Aortic atherosclerosis ([place]) ? Pressure injury of skin ? ? ?I have reviewed the medical record, interviewed the patient and family, and examined the patient. The following aspects are pertinent. ? ?Past Medical History:  ?Diagnosis Date  ? Actinic keratosis 04/05/2012  ? Insomnia, unspecified 06/08/2008  ? Lumbago 01/02/2003  ? Neoplasm of uncertain behavior of skin 04/05/2012  ? Pain in limb 02/05/2011  ? Retinal detachment 2000  ?  left eye  ? Screening cholesterol level   ? Shortness of breath 09/07/2008  ? Vitamin D deficiency 08/04/2012  ? ?Social History  ? ?Socioeconomic History  ? Marital status: Widowed  ?  Spouse name: Not on file  ? Number of children: Not on file  ? Years of education: Not on file  ? Highest education level: Not on file  ?Occupational History  ? Occupation: retired Hydrologist professor  ?Tobacco Use  ? Smoking status: Never  ? Smokeless tobacco: Never  ?Vaping Use  ? Vaping Use: Never used  ?Substance and Sexual Activity  ? Alcohol use: Yes  ?  Alcohol/week: 1.0 standard drink  ?  Types: 1 Glasses of wine per week  ?  Comment: at dinner every night.  ? Drug use: No  ? Sexual activity: Never  ?Other Topics Concern  ? Not on file  ?Social History Narrative  ? Patient is  Married Alec Weiss) since 1958--widowed a few years ago (updating 2021). Retired professor, Hydrologist. Lives in apartment, Independent Living section at [facility] since 02/2012.   ?   ? No Smoking history, drinks moderate amount of alcohol   ? Patient has Advanced planning documents: DNR  ? Exercise walking - daily ~1 1/2 - 2 miles (~ 1hr daily) as well as [facility] Exercise classes.  ? ?Social Determinants of Health  ? ?Financial Resource Strain: Not on file  ?Food Insecurity: Not on file  ?Transportation Needs: Not on file  ?Physical Activity: Not on file  ?Stress: Not on file  ?Social Connections: Not on file  ? ?Family History  ?Problem Relation Age of Onset  ? Breast cancer Mother   ?     dx after 68  ? ?Scheduled Meds: ? sodium chloride   Intravenous Once  ? Chlorhexidine Gluconate Cloth  6 each Topical Daily  ? pantoprazole (PROTONIX) IV  40 mg Intravenous Q24H  ? ?Continuous Infusions: ? sodium chloride 75 mL/hr at 05/14/21 1928  ? ?PRN Meds:.acetaminophen **OR** acetaminophen, ondansetron **OR** ondansetron (ZOFRAN) IV, sodium chloride flush ?Allergies  ?Allergen Reactions  ? Benzalkonium Chloride Rash  ?  Very allergic per patient   ?  Maxitrol [Neomycin-Polymyxin-Dexameth] Itching and Rash  ? Neosporin [Neomycin-Bacitracin Zn-Polymyx] Itching and Rash  ? ?Review of Systems  ?Constitutional:  Positive for activity change, appetite change and fatigue.  ?Neurological:  Positive for weakness.  ? ?Physical Exam ?Vitals and nursing note reviewed.  ?Constitutional:   ?   General: He is not in acute distress. ?   Appearance: He is ill-appearing.  ?Cardiovascular:  ?   Rate and Rhythm: Normal rate.  ?Pulmonary:  ?   Effort: No tachypnea, accessory muscle usage or respiratory distress.  ?Abdominal:  ?   General: Abdomen is flat.  ?Neurological:  ?  Mental Status: He is alert.  ?   Comments: Oriented but flat and minimal input into conversation but he is aware and listening. He confirmed that he would speak up if we noted anything in his plan of care and wishes that he did not agree with.   ? ? ?Vital Signs: BP (!) 151/93 (BP Location: Left Arm)   Pulse 91   Temp 98.1 ?F (36.7 ?C) (Oral)   Resp 20   Ht $R'5\' 8"'lS$  (1.727 m)   Wt 61.9 kg   SpO2 97%   BMI 20.74 kg/m?  ?Pain Scale: 0-10 ?  ?Pain Score: 0-No pain ? ? ?SpO2: SpO2: 97 % ?O2 Device:SpO2: 97 % ?O2 Flow Rate: .  ? ?IO: Intake/output summary:  ?Intake/Output Summary (Last 24 hours) at 05/15/2021 1444 ?Last data filed at 05/15/2021 0845 ?Gross per 24 hour  ?Intake 2923.63 ml  ?Output --  ?Net 2923.63 ml  ? ? ?LBM: Last BM Date : 05/14/21 ?Baseline Weight: Weight: 61.9 kg ?Most recent weight: Weight: 61.9 kg     ?Palliative Assessment/Data: ? ? ? ? ?Time Total: 45 min ? ?Greater than 50%  of this time was spent counseling and coordinating care related to the above assessment and plan. ? ?Signed by: ?Vinie Sill, NP ?Palliative Medicine Team ?Pager # [phone] (M-F 8a-5p) ?Team Phone # [phone] (Nights/Weekends) ?  ? ? ? ? ? ? ? ? ? ? ? ? ?  ?

## 2021-05-15 NOTE — Assessment & Plan Note (Signed)
-   due to underlying rectal cancer and chronic oozing ?- heme + ?- also has h/o Iron deficiency per chart ?

## 2021-05-15 NOTE — Discharge Summary (Signed)
Physician Discharge Summary  ?Alec Weiss IOX:[id number] DOB: 1926-06-29 DOA: 05/14/2021 ? ?PCP: [name], MD ? ?Admit date: 05/14/2021 ?Discharge date: 05/15/2021 ?Discharging to: [facility] ?Recommendations for Outpatient Follow-up:  ?Recommend palliative care f/u at [place] form completed by palliative care> DNR, no rehospitalization ? ?Consults:  ?Palliative care ?Procedures:  ?none ? ? ?Discharge Diagnoses:  ? Principal Problem: ?  Adenocarcinoma of rectum ([place]) ?Active Problems: ?  Hyponatremia ?  Mild aortic stenosis by prior echocardiogram ?  Iron deficiency anemia ?  Aortic atherosclerosis ([place]) ?  Pressure injury of skin ?  Microcytic anemia ? ? ? ? ?Hospital Course: ?This is a 88 year old male with rectal adenocarcinoma who has declined further chemotherapy, basal cell carcinoma, CVA, retinal detachment in the left eye who presented to the hospital for abnormal blood work that was checked due to weakness he was found to have a low sodium and a low hemoglobin and was sent to the hospital for treatment.  The patient's daughter tells me today that she was expecting that he would have a blood transfusion in the hospital.  ? ?In the ED: Sodium 127 and hemoglobin 8.2. ?Hemoglobin has been rechecked multiple times since 3/13 and has found to be 7.6 followed by 7.9 and then 7.1.  The patient has no overt bleeding and has not received any fluid boluses and I suspect these differences in hemoglobin are likely lab variations. ? ?Sodium repeated and found to be 129 and then 127. ? ?I have had extensive conversation with the daughter about considering hospice as the patient's underlying problem of rectal adenocarcinoma is likely causing his anemia and prognosis is poor.  Ultimately, the daughter decided that she wanted him to receive 1 unit of blood as it may help with his weakness.  I did also request a palliative care consult and the daughter has filled out a MOST form with palliative care.  The patient  is a DO NOT RESUSCITATE and should not be rehospitalized.  After his blood transfusion [facility]. ?  ? ?Assessment and Plan: ?Microcytic anemia ?- due to underlying rectal cancer and chronic oozing ?- heme + ?- also has h/o Iron deficiency per chart ? ? ? ? ?Discharge Instructions ? ?Discharge Instructions   ? ? Diet - low sodium heart healthy   Complete by: As directed ?  ? Increase activity slowly   Complete by: As directed ?  ? No wound care   Complete by: As directed ?  ? ?  ? ?Allergies as of 05/15/2021   ? ?   Reactions  ? Benzalkonium Chloride Rash  ? Very allergic per patient   ? Maxitrol [neomycin-polymyxin-dexameth] Itching, Rash  ? Neosporin [neomycin-bacitracin Zn-polymyx] Itching, Rash  ? ?  ? ?  ?Medication List  ?  ? ?TAKE these medications   ? ?SYSTANE OP ?Apply 1 drop to eye daily as needed (dry eyes). ?  ? ?  ? ? ?  ?  ?The results of significant diagnostics from this hospitalization (including imaging, microbiology, ancillary and laboratory) are listed below for reference.   ? ?DG Chest Port 1 View ? ?Result Date: 05/14/2021 ?CLINICAL DATA:  Fevers. EXAM: PORTABLE CHEST 1 VIEW COMPARISON:  09/05/2020 FINDINGS: Right chest wall port a catheter noted with tip in the projection of the cavoatrial junction. Heart size is normal. No pleural effusion or edema. No airspace opacities identified. Visualized osseous structures are unremarkable. IMPRESSION: No acute cardiopulmonary abnormalities. Electronically Signed  By: Kerby Moors M.D.   On: 05/14/2021 06:30   ?Labs: ?BNP (last 3 results) ?No results for input(s): BNP in the last 8760 hours. ?Basic Metabolic Panel: ?Recent Labs  ?Lab 05/13/21 ?2320 05/14/21 ?0901 05/15/21 ?1013  ?NA 127* 129* 127*  ?K 4.0 3.7 3.6  ?CL 92* 94* 94*  ?CO2 '25 26 26  '$ ?GLUCOSE 103* 92 119*  ?BUN '15 12 11  '$ ?CREATININE 0.53* 0.49* 0.55*  ?CALCIUM 9.2 8.4* 8.3*  ?MG  --  2.1  --   ?PHOS  --  3.8  --   ? ?Liver Function Tests: ?Recent Labs  ?Lab  05/13/21 ?2320  ?AST 33  ?ALT 40  ?ALKPHOS 76  ?BILITOT 0.4  ?PROT 6.5  ?ALBUMIN 3.1*  ? ?No results for input(s): LIPASE, AMYLASE in the last 168 hours. ?No results for input(s): AMMONIA in the last 168 hours. ?CBC: ?Recent Labs  ?Lab 05/13/21 ?2320 05/14/21 ?0901 05/14/21 ?1513 05/14/21 ?2113  ?WBC 14.6* 12.2*  --   --   ?NEUTROABS 12.0*  --   --   --   ?HGB 8.2* 7.6* 7.9* 7.1*  ?HCT 26.0* 24.1* 24.6* 22.0*  ?MCV 77.6* 79.5*  --   --   ?PLT 453* 427*  --   --   ? ?Cardiac Enzymes: ?No results for input(s): CKTOTAL, CKMB, CKMBINDEX, TROPONINI in the last 168 hours. ?BNP: ?Invalid input(s): POCBNP ?CBG: ?No results for input(s): GLUCAP in the last 168 hours. ?D-Dimer ?No results for input(s): DDIMER in the last 72 hours. ?Hgb A1c ?No results for input(s): HGBA1C in the last 72 hours. ?Lipid Profile ?No results for input(s): CHOL, HDL, LDLCALC, TRIG, CHOLHDL, LDLDIRECT in the last 72 hours. ?Anemia work up ?No results for input(s): VITAMINB12, FOLATE, FERRITIN, TIBC, IRON, RETICCTPCT in the last 72 hours. ?Sepsis Labs ?Invalid input(s): PROCALCITONIN,  WBC,  LACTICIDVEN ?Microbiology ?Recent Results (from the past 240 hour(s))  ?Resp Panel by RT-PCR (Flu A&B, Covid) Nasopharyngeal Swab     Status: None  ? Collection Time: 05/14/21  3:46 AM  ? Specimen: Nasopharyngeal Swab; Nasopharyngeal(NP) swabs in vial transport medium  ?Result Value Ref Range Status  ? SARS Coronavirus 2 by RT PCR NEGATIVE NEGATIVE Final  ?  Comment: (NOTE) ?SARS-CoV-2 target nucleic acids are NOT DETECTED. ? ?The SARS-CoV-2 RNA is generally detectable in upper respiratory ?specimens during the acute phase of infection. The lowest ?concentration of SARS-CoV-2 viral copies this assay can detect is ?138 copies/mL. A negative result does not preclude SARS-Cov-2 ?infection and should not be used as the sole basis for treatment or ?other patient management decisions. A negative result may occur with  ?improper specimen collection/handling, submission  of specimen other ?than nasopharyngeal swab, presence of viral mutation(s) within the ?areas targeted by this assay, and inadequate number of viral ?copies(<138 copies/mL). A negative result must be combined with ?clinical observations, patient history, and epidemiological ?information. The expected result is Negative. ? ?Fact Sheet for Patients:  ?EntrepreneurPulse.com.au ? ?Fact Sheet for Healthcare Providers:  ?IncredibleEmployment.be ? ?This test is no t yet approved or cleared by the [place] FDA and  ?has been authorized for detection and/or diagnosis of SARS-CoV-2 by ?FDA under an Emergency Use Authorization (EUA). This EUA will remain  ?in effect (meaning this test can be used) for the duration of the ?COVID-19 declaration under Section 564(b)(1) of the Act, 21 ?U.S.C.section 360bbb-3(b)(1), unless the authorization is terminated  ?or revoked sooner.  ? ? ?  ? Influenza A by PCR NEGATIVE NEGATIVE Final  ?  Influenza B by PCR NEGATIVE NEGATIVE Final  ?  Comment: (NOTE) ?The Xpert Xpress SARS-CoV-2/FLU/RSV plus assay is intended as an aid ?in the diagnosis of influenza from Nasopharyngeal swab specimens and ?should not be used as a sole basis for treatment. Nasal washings and ?aspirates are unacceptable for Xpert Xpress SARS-CoV-2/FLU/RSV ?testing. ? ?Fact Sheet for Patients: ?EntrepreneurPulse.com.au ? ?Fact Sheet for Healthcare Providers: ?IncredibleEmployment.be ? ?This test is not yet approved or cleared by the [place] FDA and ?has been authorized for detection and/or diagnosis of SARS-CoV-2 by ?FDA under an Emergency Use Authorization (EUA). This EUA will remain ?in effect (meaning this test can be used) for the duration of the ?COVID-19 declaration under Section 564(b)(1) of the Act, 21 U.S.C. ?section 360bbb-3(b)(1), unless the authorization is terminated or ?revoked. ? ?Performed at [facility], [address], ?[place], [place] [address] ?  ?C Difficile Quick Screen w PCR reflex     Status: None  ? Collection Time: 05/14/21 10:00 PM  ? Specimen: STOOL  ?Result Value Ref Range Status  ? C Diff antigen

## 2021-05-15 NOTE — Hospital Course (Signed)
This is a 88 year old male with rectal adenocarcinoma who has declined further chemotherapy, basal cell carcinoma, CVA, retinal detachment in the left eye who presented to the hospital for abnormal blood work that was checked due to weakness he was found to have a low sodium and a low hemoglobin and was sent to the hospital for treatment.  The patient's daughter tells me today that she was expecting that he would have a blood transfusion in the hospital.  ? ?In the ED: Sodium 127 and hemoglobin 8.2. ?Hemoglobin has been rechecked multiple times since 3/13 and has found to be 7.6 followed by 7.9 and then 7.1.  The patient has no overt bleeding and has not received any fluid boluses and I suspect these differences in hemoglobin are likely lab variations. ? ?Sodium repeated and found to be 129 and then 127. ? ?I have had extensive conversation with the daughter about considering hospice as the patient's underlying problem of rectal adenocarcinoma is likely causing his anemia and prognosis is poor.  Ultimately, the daughter decided that she wanted him to receive 1 unit of blood as it may help with his weakness.  I did also request a palliative care consult and the daughter has filled out a MOST form with palliative care.  The patient is a DO NOT RESUSCITATE and should not be rehospitalized.  After his blood transfusion [facility]. ?

## 2021-05-15 NOTE — NC FL2 (Signed)
?[place] MEDICAID FL2 LEVEL OF CARE SCREENING TOOL  ?  ? ?IDENTIFICATION  ?Patient Name: ?Alec Weiss Birthdate: 21-Jun-1926 Sex: male Admission Date (Current Location): ?05/14/2021  ?[place] and [place] Number: ? [place] ?  Facility and Address:  ?[facility], [place] [address] [place], [place] [address] ?     Provider Number: ?[id number]  ?Attending Physician Name and Address:  ?Debbe Odea, MD ? Relative Name and Phone Number:  ?Talbot,Christine (Daughter)   [phone] [facility] Phone) ?   ?Current Level of Care: ?Hospital Recommended Level of Care: ?[place] Prior Approval Number: ?  ? ?Date Approved/Denied: ?  PASRR Number: ?  ? ?Discharge Plan: ?SNF ?  ? ?Current Diagnoses: ?Patient Active Problem List  ? Diagnosis Date Noted  ? Acute metabolic encephalopathy 64/33/2951  ? Pressure injury of skin 05/14/2021  ? Aortic atherosclerosis ([place]) 03/01/2021  ? Hyponatremia 03/01/2021  ? Genetic testing 10/26/2020  ? Diarrhea 10/23/2020  ? Adenocarcinoma of rectum ([place]) 09/06/2020  ? MGUS (monoclonal gammopathy of unknown significance) 08/09/2020  ? IDA (iron deficiency anemia) 07/11/2020  ? Iron deficiency anemia 07/11/2020  ? Mild aortic stenosis by prior echocardiogram 12/21/2019  ? Varicose veins of leg with edema, bilateral 12/21/2019  ? Ventral hernia without obstruction or gangrene 12/21/2019  ? Tinea corporis 12/21/2019  ? Right hip crepitus 08/04/2019  ? Loose stools 08/04/2019  ? Acute on chronic blood loss anemia 12/09/2017  ? Nevus, non-neoplastic 11/28/2015  ? Detached retina 11/28/2015  ? Aortic systolic murmur on examination 11/28/2015  ? Insomnia 03/24/2013  ? Pain in joint, shoulder region 09/13/2012  ? Vitamin D deficiency 08/04/2012  ? Actinic keratosis 04/05/2012  ? ? ?Orientation RESPIRATION BLADDER Height & Weight   ?  ?Self, Time, Situation, Place ? Normal Continent Weight: 136 lb 6.4 oz (61.9 kg) ?Height:  '5\' 8"'$  (172.7 cm)  ?BEHAVIORAL SYMPTOMS/MOOD  NEUROLOGICAL BOWEL NUTRITION STATUS  ?    Incontinent Diet (See d/c summary)  ?AMBULATORY STATUS COMMUNICATION OF NEEDS Skin   ?Extensive Assist Verbally PU Stage and Appropriate Care (Venous stasis ulcer pretibial left; pressure ulcer sacrum stage 1 bilateral) ?  ?  ?  ?    ?     ?     ? ? ?Personal Care Assistance Level of Assistance  ?Bathing, Dressing, Feeding Bathing Assistance: Maximum assistance ?Feeding assistance: Independent ?Dressing Assistance: Limited assistance ?   ? ?Functional Limitations Info  ?Sight, Hearing, Speech Sight Info: Adequate ?Hearing Info: Adequate ?Speech Info: Adequate  ? ? ?SPECIAL CARE FACTORS FREQUENCY  ?PT (By licensed PT), OT (By licensed OT)   ?  ?PT Frequency: 5x/week ?OT Frequency: 5x/week ?  ?  ?  ?   ? ? ?Contractures Contractures Info: Not present  ? ? ?Additional Factors Info  ?Allergies, Code Status Code Status Info: DNR ?Allergies Info: Benzalkonium Chloride, Maxitrol (neomycin-polymyxin-dexameth), Neosporin (neomycin-bacitracin Zn-polymyx) ?  ?  ?  ?   ? ?Current Medications (05/15/2021):  This is the current hospital active medication list ?Current Facility-Administered Medications  ?Medication Dose Route Frequency Provider Last Rate Last Admin  ? 0.9 %  sodium chloride infusion (Manually program via Guardrails IV Fluids)   Intravenous Once Debbe Odea, MD      ? 0.9 %  sodium chloride infusion   Intravenous Continuous Reubin Milan, MD 75 mL/hr at 05/14/21 1928 Infusion Verify at 05/14/21 1928  ? acetaminophen (TYLENOL) tablet 650 mg  650 mg Oral Q6H PRN Reubin Milan, MD      ?  Or  ? acetaminophen (TYLENOL) suppository 650 mg  650 mg Rectal Q6H PRN Reubin Milan, MD      ? Chlorhexidine Gluconate Cloth 2 % PADS 6 each  6 each Topical Daily Mauri Pole, MD   6 each at 05/15/21 1042  ? ondansetron (ZOFRAN) tablet 4 mg  4 mg Oral Q6H PRN Reubin Milan, MD      ? Or  ? ondansetron [facility]) injection 4 mg  4 mg Intravenous Q6H PRN Reubin Milan, MD      ? pantoprazole (PROTONIX) injection 40 mg  40 mg Intravenous Q24H Reubin Milan, MD   40 mg at 05/15/21 1042  ? sodium chloride flush (NS) 0.9 % injection 10-40 mL  10-40 mL Intracatheter PRN Mauri Pole, MD      ? ? ? ?Discharge Medications: ?Please see discharge summary for a list of discharge medications. ? ?Relevant Imaging Results: ? ?Relevant Lab Results: ? ? ?Additional Information ?SSN [id number] ? ?[place], LCSW ? ? ? ? ?

## 2021-05-16 ENCOUNTER — Non-Acute Institutional Stay (SKILLED_NURSING_FACILITY): Payer: Medicare PPO | Admitting: Adult Health

## 2021-05-16 DIAGNOSIS — D72829 Elevated white blood cell count, unspecified: Secondary | ICD-10-CM

## 2021-05-16 DIAGNOSIS — E871 Hypo-osmolality and hyponatremia: Secondary | ICD-10-CM

## 2021-05-16 DIAGNOSIS — I350 Nonrheumatic aortic (valve) stenosis: Secondary | ICD-10-CM | POA: Diagnosis not present

## 2021-05-16 DIAGNOSIS — C20 Malignant neoplasm of rectum: Secondary | ICD-10-CM

## 2021-05-16 DIAGNOSIS — D500 Iron deficiency anemia secondary to blood loss (chronic): Secondary | ICD-10-CM | POA: Diagnosis not present

## 2021-05-16 DIAGNOSIS — R413 Other amnesia: Secondary | ICD-10-CM

## 2021-05-16 LAB — BPAM RBC
Blood Product Expiration Date: 202304052359
ISSUE DATE / TIME: 202303151627
Unit Type and Rh: 6200

## 2021-05-16 LAB — TYPE AND SCREEN
ABO/RH(D): A POS
Antibody Screen: NEGATIVE
Unit division: 0

## 2021-05-17 ENCOUNTER — Encounter: Payer: Self-pay | Admitting: Adult Health

## 2021-05-17 NOTE — Progress Notes (Addendum)
? ?Location:  [facility] ?  ?Place of Service:  SNF (31) ?Provider:   ?Cindi Carbon, ANP ?[place] ?[phone] ? ? ?[name], MD ? ?Patient Care Team: ?[name], MD as PCP - General (Internal Medicine) ?Community, [facility] ?Monna Fam, MD as Consulting Physician (Ophthalmology) ?Benay Pike, MD as Consulting Physician (Hematology and Oncology) ? ?Extended Emergency Contact Information ?Primary Emergency Contact: Blixt,Christine ?Address: [address] ?         [place], [place] [address] [place] ?Home Phone: [phone] ?Mobile Phone: [phone] ?Relation: Daughter ? ?Code Status:  DNR ?Goals of care: Advanced Directive information ?Advanced Directives 05/14/2021  ?Does Patient Have a Medical Advance Directive? Yes  ?Type of Advance Directive Out of facility DNR (pink MOST or yellow form)  ?Does patient want to make changes to medical advance directive? No - Patient declined  ?Copy of [place] in Chart? -  ?Would patient like information on creating a medical advance directive? -  ?Pre-existing out of facility DNR order (yellow form or pink MOST form) -  ? ? ? ?Chief Complaint  ?Patient presents with  ? Hospitalization Follow-up  ? ? ?HPI:  ?Pt is a 88 y.o. male seen today for a hospitalization overnight 05/14/21-05/15/21 due to hyponatremia and anemia.  ?PMH significant for iron def anemia due to chronic blood loss and adenocarcinoma of the rectum. He has chosen to forego further treatment and his goals of care are comfort based. ?I saw him and Labs were ordered on 3/13  due to weakness, confusion,and tachycardia. He was found to have a hgb of 8.2 WBC 14.6 mcv 77 NA was 127. THe nurses could not obtain a urine sample as he had no urine. This information was relayed to the oncall provider and he was sent to the ER.  ? ?In the ER they did a work up for leukocytosis and his UA neg. CXR neg.  ?During received 1  unit of blood and some fluid boluses. Decided to go with comfort care and no further hospitalizations after talking with palliative care.  ?Past Medical History:  ?Diagnosis Date  ? Actinic keratosis 04/05/2012  ? Insomnia, unspecified 06/08/2008  ? Lumbago 01/02/2003  ? Neoplasm of uncertain behavior of skin 04/05/2012  ? Pain in limb 02/05/2011  ? Retinal detachment 2000  ? left eye  ? Screening cholesterol level   ? Shortness of breath 09/07/2008  ? Vitamin D deficiency 08/04/2012  ? ?Past Surgical History:  ?Procedure Laterality Date  ? BIOPSY  08/23/2020  ? Procedure: BIOPSY;  Surgeon: Doran Stabler, MD;  Location: [facility];  Service: Gastroenterology;;  ? COLONOSCOPY WITH PROPOFOL N/A 08/23/2020  ? Procedure: COLONOSCOPY WITH PROPOFOL;  Surgeon: Doran Stabler, MD;  Location: [facility];  Service: Gastroenterology;  Laterality: N/A;  ? ESOPHAGOGASTRODUODENOSCOPY (EGD) WITH PROPOFOL N/A 08/23/2020  ? Procedure: ESOPHAGOGASTRODUODENOSCOPY (EGD) WITH PROPOFOL;  Surgeon: Doran Stabler, MD;  Location: [facility];  Service: Gastroenterology;  Laterality: N/A;  ? EXTERNAL EAR SURGERY  05/2012  ? left ear   ? INGUINAL HERNIA REPAIR Right 01/21/2005  ? Dr. [name]  ? IR IMAGING GUIDED PORT INSERTION  10/09/2020  ? LID LESION EXCISION  2009  ? MOHS SURGERY Left 05/28/2012  ? ear lobe Dr. [name]  ? RETINAL DETACHMENT SURGERY Left 1997  ? [place] INJECTION  08/23/2020  ? Procedure: SUBMUCOSAL INJECTION;  Surgeon: Doran Stabler, MD;  Location: [facility];  Service: Gastroenterology;;  ? ? ?Allergies  ?Allergen Reactions  ? Benzalkonium Chloride Rash  ?  Very allergic per patient   ? Maxitrol [Neomycin-Polymyxin-Dexameth] Itching and Rash  ? Neosporin [Neomycin-Bacitracin Zn-Polymyx] Itching and Rash  ? ? ?Outpatient Encounter Medications as of 05/16/2021  ?Medication Sig  ? Polyethyl Glycol-Propyl Glycol (SYSTANE OP) Apply 1 drop to eye daily as needed (dry eyes).  ? ?No facility-administered  encounter medications on file as of 05/16/2021.  ? ? ?Review of Systems  ?Constitutional:  Negative for activity change, appetite change, chills, diaphoresis, fatigue, fever and unexpected weight change.  ?Respiratory:  Negative for cough, shortness of breath, wheezing and stridor.   ?Cardiovascular:  Positive for leg swelling. Negative for chest pain and palpitations.  ?Gastrointestinal:  Negative for abdominal distention, abdominal pain, constipation and diarrhea.  ?Genitourinary:  Negative for difficulty urinating and dysuria.  ?Musculoskeletal:  Positive for gait problem. Negative for arthralgias, back pain, joint swelling and myalgias.  ?Neurological:  Negative for dizziness, seizures, syncope, facial asymmetry, speech difficulty, weakness and headaches.  ?Hematological:  Negative for adenopathy. Does not bruise/bleed easily.  ?Psychiatric/Behavioral:  Positive for confusion. Negative for agitation and behavioral problems.   ? ?Immunization History  ?Administered Date(s) Administered  ? Hep A / Hep B 07/21/2001, 08/18/2001  ? Hepatitis A, Ped/Adol-2 Dose 01/24/2002  ? IPV 08/18/2001  ? Influenza Whole 12/02/2011, 12/31/2012  ? Influenza, High Dose Seasonal PF 12/10/2017, 12/10/2018, 12/30/2019  ? Influenza,inj,Quad PF,6+ Mos 12/20/2013  ? Influenza-Unspecified 12/21/2014, 12/27/2015, 12/22/2016, 12/10/2018, 12/05/2020  ? Moderna SARS-COV2 Booster Vaccination 12/12/2020  ? Moderna Sars-Covid-2 Vaccination 03/25/2019, 04/12/2019, 01/17/2020  ? Pneumococcal Conjugate-13 11/22/2014  ? Pneumococcal Polysaccharide-23 07/21/2001, 12/03/2016  ? Td 04/12/2009  ? Tdap 12/10/2017, 05/07/2020  ? Typhoid Inactivated 07/21/2001, 04/12/2009  ? Zoster, Live 02/01/2011  ? ?Pertinent  Health Maintenance Due  ?Topic Date Due  ? INFLUENZA VACCINE  Completed  ? ?Fall Risk 10/23/2020 05/13/2021 05/14/2021 05/14/2021 05/15/2021  ?Falls in the past year? - - - - -  ?Number of falls in past year - - - - -  ?Was there an injury with Fall? - -  - - -  ?Was there an injury with Fall? - - - - -  ?Fall Risk Category Calculator - - - - -  ?Fall Risk Category - - - - -  ?Patient Fall Risk Level High fall risk Moderate fall risk High fall risk High fall risk High fall risk  ?Fall risk Follow up - - - - -  ? ?Functional Status Survey: ?  ? ?Vitals:  ? 05/17/21 1144  ?BP: (!) 151/69  ?Pulse: 76  ?Resp: 20  ?Temp: 97.9 ?F (36.6 ?C)  ?SpO2: 95%  ? ?There is no height or weight on file to calculate BMI. ?Physical Exam ?Vitals and nursing note reviewed.  ?Constitutional:   ?   General: He is not in acute distress. ?   Appearance: He is not diaphoretic.  ?HENT:  ?   Head: Normocephalic and atraumatic.  ?   Mouth/Throat:  ?   Mouth: Mucous membranes are moist.  ?   Pharynx: Oropharynx is clear.  ?Eyes:  ?   Conjunctiva/sclera: Conjunctivae normal.  ?   Pupils: Pupils are equal, round, and reactive to light.  ?Neck:  ?   Thyroid: No thyromegaly.  ?   Vascular: No JVD.  ?   Trachea: No tracheal deviation.  ?Cardiovascular:  ?   Rate and Rhythm: Normal rate and regular rhythm.  ?  Heart sounds: Murmur heard.  ?Pulmonary:  ?   Effort: Pulmonary effort is normal. No respiratory distress.  ?   Breath sounds: Normal breath sounds. No wheezing.  ?Abdominal:  ?   General: Bowel sounds are normal. There is distension (chronic rotund).  ?   Palpations: Abdomen is soft.  ?   Tenderness: There is no abdominal tenderness.  ?Musculoskeletal:  ?   Cervical back: Normal range of motion and neck supple.  ?   Right lower leg: No edema.  ?   Left lower leg: No edema.  ?Lymphadenopathy:  ?   Cervical: No cervical adenopathy.  ?Skin: ?   General: Skin is warm and dry.  ?   Coloration: Skin is pale.  ?Neurological:  ?   Mental Status: He is alert and oriented to person, place, and time.  ?   Cranial Nerves: No cranial nerve deficit.  ?Psychiatric:     ?   Mood and Affect: Mood normal.  ? ? ?Labs reviewed: ?Recent Labs  ?  05/13/21 ?2320 05/14/21 ?0901 05/15/21 ?1013  ?NA 127* 129* 127*  ?K  4.0 3.7 3.6  ?CL 92* 94* 94*  ?CO2 '25 26 26  '$ ?GLUCOSE 103* 92 119*  ?BUN '15 12 11  '$ ?CREATININE 0.53* 0.49* 0.55*  ?CALCIUM 9.2 8.4* 8.3*  ?MG  --  2.1  --   ?PHOS  --  3.8  --   ? ?Recent Labs  ?  08/23

## 2021-05-19 ENCOUNTER — Emergency Department (HOSPITAL_BASED_OUTPATIENT_CLINIC_OR_DEPARTMENT_OTHER)
Admission: EM | Admit: 2021-05-19 | Discharge: 2021-05-19 | Disposition: A | Discharge status: Home / Self Care | Payer: Medicare PPO | Attending: Emergency Medicine | Admitting: Emergency Medicine

## 2021-05-19 ENCOUNTER — Other Ambulatory Visit: Payer: Self-pay

## 2021-05-19 DIAGNOSIS — W01198A Fall on same level from slipping, tripping and stumbling with subsequent striking against other object, initial encounter: Secondary | ICD-10-CM | POA: Insufficient documentation

## 2021-05-19 DIAGNOSIS — S0101XA Laceration without foreign body of scalp, initial encounter: Secondary | ICD-10-CM | POA: Insufficient documentation

## 2021-05-19 DIAGNOSIS — S51811A Laceration without foreign body of right forearm, initial encounter: Secondary | ICD-10-CM | POA: Diagnosis not present

## 2021-05-19 DIAGNOSIS — W19XXXA Unspecified fall, initial encounter: Secondary | ICD-10-CM

## 2021-05-19 DIAGNOSIS — S0990XA Unspecified injury of head, initial encounter: Secondary | ICD-10-CM | POA: Diagnosis present

## 2021-05-19 DIAGNOSIS — Y92039 Unspecified place in apartment as the place of occurrence of the external cause: Secondary | ICD-10-CM | POA: Insufficient documentation

## 2021-05-19 NOTE — ED Notes (Signed)
Report given to the RN at [facility]. ?

## 2021-05-19 NOTE — ED Triage Notes (Addendum)
Patient arrives via EMS from [facility] due to a fall. Per ems, the patient had an unwitnessed fall in his room. Patient reports that he walked into his room and fell, hitting the back of his head. Patient was found by facility nurse, with a down time of about 20 minutes. Unsure of LOC and the patient does not take blood thinners.  ?Alert and oriented x3 on arrival.  ?Lacerations to left posterior head with some bruising. ? ?Patient is Comfort Care, cancer patient.  ?

## 2021-05-19 NOTE — ED Provider Notes (Signed)
?[facility] EMERGENCY DEPT ?Provider Note ? ? ?CSN: [id number] ?Arrival date & time: 05/19/21  1747 ? ?  ? ?History ? ?Chief Complaint  ?Patient presents with  ? Fall  ? Head Injury  ? ? ?Alec Weiss is a 88 y.o. male. ? ?Patient is a DNR.  Patient was just discharged from the hospital for anemia on March 14.  He is a DNR.  Patient had a fall in [facility].  He fell backwards striking his head no loss of consciousness.  He got a skin tear to his right distal forearm that [facility] applied Steri-Strips and a bandage.  No significant bony pain there.  Patient has some abrasions and scalp lacerations to these head area.  Denies any pain anywhere else.  Patient denies any neck pain.  Patient able to move all extremities. ? ?Past medical history significant for end-stage rectal cancer ? ? ?  ? ?Home Medications ?Prior to Admission medications   ?Medication Sig Start Date End Date Taking? Authorizing Provider  ?Polyethyl Glycol-Propyl Glycol (SYSTANE OP) Apply 1 drop to eye daily as needed (dry eyes).    [provider]  ?   ? ?Allergies    ?Benzalkonium chloride, Maxitrol [neomycin-polymyxin-dexameth], and Neosporin [neomycin-bacitracin zn-polymyx]   ? ?Review of Systems   ?Review of Systems  ?Constitutional:  Negative for chills and fever.  ?HENT:  Negative for ear pain and sore throat.   ?Eyes:  Negative for pain and visual disturbance.  ?Respiratory:  Negative for cough and shortness of breath.   ?Cardiovascular:  Negative for chest pain and palpitations.  ?Gastrointestinal:  Negative for abdominal pain and vomiting.  ?Genitourinary:  Negative for dysuria and hematuria.  ?Musculoskeletal:  Negative for arthralgias and back pain.  ?Skin:  Negative for color change and rash.  ?Neurological:  Negative for seizures and syncope.  ?Hematological:  Does not bruise/bleed easily.  ?All other systems reviewed and are negative. ? ?Physical Exam ?Updated Vital Signs ?BP (!) 142/88    Pulse 96   Temp 98.6 ?F (37 ?C) (Oral)   Resp 17   SpO2 95%  ?Physical Exam ?Vitals and nursing note reviewed.  ?Constitutional:   ?   General: He is not in acute distress. ?   Appearance: Normal appearance. He is well-developed.  ?HENT:  ?   Head: Normocephalic.  ?   Comments: On the left side there is a 3 cm scalp laceration parietal occipital area.  And there is a abrasion measuring about 2 x 2 cm.  On the right side there is a 1 cm scalp laceration.  No active bleeding on any of them. ?Eyes:  ?   Extraocular Movements: Extraocular movements intact.  ?   Conjunctiva/sclera: Conjunctivae normal.  ?   Pupils: Pupils are equal, round, and reactive to light.  ?Cardiovascular:  ?   Rate and Rhythm: Normal rate and regular rhythm.  ?   Heart sounds: No murmur heard. ?Pulmonary:  ?   Effort: Pulmonary effort is normal. No respiratory distress.  ?   Breath sounds: Normal breath sounds.  ?Abdominal:  ?   Palpations: Abdomen is soft.  ?   Tenderness: There is no abdominal tenderness.  ?Musculoskeletal:     ?   General: Signs of injury present. No swelling.  ?   Cervical back: Normal range of motion and neck supple. No rigidity or tenderness.  ?   Comments: Nicely dressed bandage and Steri-Strips over a skin tear of the distal right forearm.  ?  Skin: ?   General: Skin is warm and dry.  ?   Capillary Refill: Capillary refill takes less than 2 seconds.  ?Neurological:  ?   General: No focal deficit present.  ?   Mental Status: He is alert and oriented to person, place, and time.  ?   Cranial Nerves: No cranial nerve deficit.  ?   Sensory: No sensory deficit.  ?Psychiatric:     ?   Mood and Affect: Mood normal.  ? ? ?ED Results / Procedures / Treatments   ?Labs ?(all labs ordered are listed, but only abnormal results are displayed) ?Labs Reviewed - No data to display ? ?EKG ?None ? ?Radiology ?No results found. ? ?Procedures ?Marland Kitchen.Laceration Repair ? ?Date/Time: 05/19/2021 8:25 PM ?Performed by: Fredia Sorrow, MD ?Authorized  by: Fredia Sorrow, MD  ? ?Consent:  ?  Consent obtained:  Verbal ?  Consent given by:  Patient ?  Risks discussed:  Pain and infection ?  Alternatives discussed:  No treatment ?Universal protocol:  ?  Procedure explained and questions answered to patient or proxy's satisfaction: yes   ?  Site/side marked: no   ?  Immediately prior to procedure, a time out was called: yes   ?  Patient identity confirmed:  Verbally with patient ?Anesthesia:  ?  Anesthesia method:  None ?Laceration details:  ?  Location:  Scalp ?  Length (cm):  4 ?  Depth (mm):  2 ?Exploration:  ?  Limited defect created (wound extended): no   ?  Wound exploration: wound explored through full range of motion and entire depth of wound visualized   ?  Wound extent: no fascia violation noted   ?  Contaminated: no   ?Treatment:  ?  Area cleansed with:  Shur-Clens ?  Amount of cleaning:  Standard ?  Debridement:  None ?  Undermining:  None ?Skin repair:  ?  Repair method:  Staples ?  Number of staples:  4 ?Approximation:  ?  Approximation:  Loose ?Repair type:  ?  Repair type:  Intermediate ?Post-procedure details:  ?  Dressing:  Antibiotic ointment ?  Procedure completion:  Tolerated well, no immediate complications  ? ? ?Medications Ordered in ED ?Medications - No data to display ? ?ED Course/ Medical Decision Making/ A&P ?  ?                        ?Medical Decision Making ? ?Patient refused CT of the head.  He understands the potential risks.  He is basing it on the fact that he is a DNR and that the CT showed anything he would not want any intervention.  Also that he has the end-stage rectal cancer.  We will abide by his wishes. ? ?Patient had staples into the scalp lacerations.  And his wounds were cleaned up and irrigated. ? ? ?Final Clinical Impression(s) / ED Diagnoses ?Final diagnoses:  ?Fall, initial encounter  ?Scalp laceration, initial encounter  ? ? ?Rx / DC Orders ?ED Discharge Orders   ? ? None  ? ?  ? ? ?  ?Fredia Sorrow, MD ?05/19/21  2029 ? ?

## 2021-05-19 NOTE — ED Notes (Signed)
Pt according to the family and the Pt is comfort measures only. Refusing anything invasive. ?

## 2021-05-19 NOTE — ED Notes (Signed)
Laceration/avulsion to the left side of his head, bleeding controlled. ?Hematoma to the rear of his head, no obvious bleeding to note. ?

## 2021-05-19 NOTE — Discharge Instructions (Addendum)
Keep the wounds dry for 24 hours.  Apply antibiotic ointment daily.  Staple removal in 7 to 10 days.  This can probably be done at [facility]. ?

## 2021-05-19 NOTE — ED Notes (Signed)
Pt notified Medic that he decided that he did not want a CT scan of his head. Provider notified. ? ?

## 2021-05-20 ENCOUNTER — Non-Acute Institutional Stay (SKILLED_NURSING_FACILITY): Payer: Medicare PPO | Admitting: Internal Medicine

## 2021-05-20 ENCOUNTER — Encounter: Payer: Self-pay | Admitting: Internal Medicine

## 2021-05-20 DIAGNOSIS — D500 Iron deficiency anemia secondary to blood loss (chronic): Secondary | ICD-10-CM | POA: Diagnosis not present

## 2021-05-20 DIAGNOSIS — E871 Hypo-osmolality and hyponatremia: Secondary | ICD-10-CM

## 2021-05-20 DIAGNOSIS — C20 Malignant neoplasm of rectum: Secondary | ICD-10-CM

## 2021-05-20 MED ORDER — LORAZEPAM 0.5 MG PO TABS: 0.5000 mg | ORAL_TABLET | ORAL | 1 refills | Status: AC | PRN

## 2021-05-20 NOTE — Progress Notes (Addendum)
?Provider:  Veleta Miners MD ?Location:   [facility] ?Nursing Home Room Number: 539 ?Place of Service:  SNF (31) ? ?PCP: [name], MD ?Patient Care Team: ?[name], MD as PCP - General (Internal Medicine) ?Community, [facility] ?Monna Fam, MD as Consulting Physician (Ophthalmology) ?Benay Pike, MD as Consulting Physician (Hematology and Oncology) ? ?Extended Emergency Contact Information ?Primary Emergency Contact: Holten,Christine ?Address: [address] ?         [place], [place] [address] [place] ?Home Phone: [phone] ?Mobile Phone: [phone] ?Relation: Daughter ? ?Code Status: DNR ?Goals of Care: Advanced Directive information ?Advanced Directives 05/14/2021  ?Does Patient Have a Medical Advance Directive? Yes  ?Type of Advance Directive Out of facility DNR (pink MOST or yellow form)  ?Does patient want to make changes to medical advance directive? No - Patient declined  ?Copy of [place] in Chart? -  ?Would patient like information on creating a medical advance directive? -  ?Pre-existing out of facility DNR order (yellow form or pink MOST form) -  ? ? ? ? ?Chief Complaint  ?Patient presents with  ? Medical Management of Chronic Issues  ? Quality Metric Gaps  ?  Verified [facility] and [facility] patient is due for Shingrix  ? ? ?HPI: Patient is a 88 y.o. male seen today for Readmission to SNF ? ?Patient with diagnosis of adenocarcinoma of rectum . ?Did not tolerate Chemo and Decided to not pursue any treatment ?Was taken off all meds ? ? ? Was admitted in the hospital from 03/14-03/15 for Weakness and HGB of 7.1 ?Sodium of 127 ?Got One unit of Blood ?Not Enrolled in Hospice as he has refused  ?[place] again over the weekend Sustained Laceration Need staples ?Says he got up by himself and [place] Backwards hitting his head. ?Seems Somnolent confused  ?Did eat this morning ?No Pain or any discomfort ?Says he feels very  weak ? ? ?Past Medical History:  ?Diagnosis Date  ? Actinic keratosis 04/05/2012  ? Insomnia, unspecified 06/08/2008  ? Lumbago 01/02/2003  ? Neoplasm of uncertain behavior of skin 04/05/2012  ? Pain in limb 02/05/2011  ? Retinal detachment 2000  ? left eye  ? Screening cholesterol level   ? Shortness of breath 09/07/2008  ? Vitamin D deficiency 08/04/2012  ? ?Past Surgical History:  ?Procedure Laterality Date  ? BIOPSY  08/23/2020  ? Procedure: BIOPSY;  Surgeon: Doran Stabler, MD;  Location: [facility];  Service: Gastroenterology;;  ? COLONOSCOPY WITH PROPOFOL N/A 08/23/2020  ? Procedure: COLONOSCOPY WITH PROPOFOL;  Surgeon: Doran Stabler, MD;  Location: [facility];  Service: Gastroenterology;  Laterality: N/A;  ? ESOPHAGOGASTRODUODENOSCOPY (EGD) WITH PROPOFOL N/A 08/23/2020  ? Procedure: ESOPHAGOGASTRODUODENOSCOPY (EGD) WITH PROPOFOL;  Surgeon: Doran Stabler, MD;  Location: [facility];  Service: Gastroenterology;  Laterality: N/A;  ? EXTERNAL EAR SURGERY  05/2012  ? left ear   ? INGUINAL HERNIA REPAIR Right 01/21/2005  ? Dr. [name]  ? IR IMAGING GUIDED PORT INSERTION  10/09/2020  ? LID LESION EXCISION  2009  ? MOHS SURGERY Left 05/28/2012  ? ear lobe Dr. [name]  ? RETINAL DETACHMENT SURGERY Left 1997  ? [place] INJECTION  08/23/2020  ? Procedure: SUBMUCOSAL INJECTION;  Surgeon: Doran Stabler, MD;  Location: [facility];  Service: Gastroenterology;;  ? ? reports that he has never smoked. He has never used smokeless tobacco. He reports current alcohol use of  about 1.0 standard drink per week. He reports that he does not use drugs. ?Social History  ? ?Socioeconomic History  ? Marital status: Widowed  ?  Spouse name: Not on file  ? Number of children: Not on file  ? Years of education: Not on file  ? Highest education level: Not on file  ?Occupational History  ? Occupation: retired Hydrologist professor  ?Tobacco Use  ? Smoking status: Never  ? Smokeless tobacco: Never  ?Vaping Use  ? Vaping Use:  Never used  ?Substance and Sexual Activity  ? Alcohol use: Yes  ?  Alcohol/week: 1.0 standard drink  ?  Types: 1 Glasses of wine per week  ?  Comment: at dinner every night.  ? Drug use: No  ? Sexual activity: Never  ?Other Topics Concern  ? Not on file  ?Social History Narrative  ? Patient is  Married [name]) since 1958--widowed a few years ago (updating 2021). Retired professor, Hydrologist. Lives in apartment, Independent Living section at [facility] since 02/2012.   ?   ? No Smoking history, drinks moderate amount of alcohol   ? Patient has Advanced planning documents: DNR  ? Exercise walking - daily ~1 1/2 - 2 miles (~ 1hr daily) as well as [facility] Exercise classes.  ? ?Social Determinants of Health  ? ?Financial Resource Strain: Not on file  ?Food Insecurity: Not on file  ?Transportation Needs: Not on file  ?Physical Activity: Not on file  ?Stress: Not on file  ?Social Connections: Not on file  ?Intimate Partner Violence: Not on file  ? ? ?Functional Status Survey: ?  ? ?Family History  ?Problem Relation Age of Onset  ? Breast cancer Mother   ?     dx after 22  ? ? ?Health Maintenance  ?Topic Date Due  ? Zoster Vaccines- Shingrix (1 of 2) Never done  ? COVID-19 Vaccine (4 - Booster) 10/01/2021 (Originally 02/06/2021)  ? TETANUS/TDAP  05/08/2030  ? Pneumonia Vaccine 75+ Years old  Completed  ? INFLUENZA VACCINE  Completed  ? HPV VACCINES  Aged Out  ? ? ?Allergies  ?Allergen Reactions  ? Benzalkonium Chloride Rash  ?  Very allergic per patient   ? Maxitrol [Neomycin-Polymyxin-Dexameth] Itching and Rash  ? Neosporin [Neomycin-Bacitracin Zn-Polymyx] Itching and Rash  ? ? ?Allergies as of 05/20/2021   ? ?   Reactions  ? Benzalkonium Chloride Rash  ? Very allergic per patient   ? Maxitrol [neomycin-polymyxin-dexameth] Itching, Rash  ? Neosporin [neomycin-bacitracin Zn-polymyx] Itching, Rash  ? ?  ? ?  ?Medication List  ?  ? ?  ? Accurate as of May 20, 2021 11:18 AM. If you have any questions, ask  your nurse or doctor.  ?  ?  ? ?  ? ?ATROPINE SULFATE PO ?Take 4 drops by mouth every 6 (six) hours as needed. ?  ?bisacodyl 10 MG suppository ?Commonly known as: DULCOLAX ?Place 10 mg rectally daily as needed for moderate constipation. ?  ?loperamide HCl 2 MG/15ML solution ?Commonly known as: IMODIUM ?Take 2 mg by mouth as needed for diarrhea or loose stools. ?  ?melatonin 3 MG Tabs tablet ?Take 3 mg by mouth at bedtime. ?  ?morphine CONCENTRATE 10 mg / 0.5 ml concentrated solution ?Take by mouth every 4 (four) hours as needed for severe pain. 20 mg/ml; amt: 5 mg; sublingual ?  ?ondansetron 8 MG tablet ?Commonly known as: ZOFRAN ?Take 8 mg by mouth every 8 (eight) hours as needed for nausea or  vomiting. ?  ?SYSTANE OP ?Apply 1 drop to eye daily as needed (dry eyes). ?  ? ?  ? ? ?Review of Systems  ?Constitutional:  Positive for activity change and appetite change. Negative for unexpected weight change.  ?HENT: Negative.    ?Respiratory:  Negative for cough and shortness of breath.   ?Cardiovascular:  Negative for leg swelling.  ?Gastrointestinal:  Positive for abdominal distention. Negative for constipation.  ?Genitourinary:  Negative for frequency.  ?Musculoskeletal:  Positive for gait problem. Negative for arthralgias and myalgias.  ?Skin: Negative.  Negative for rash.  ?Neurological:  Positive for weakness. Negative for dizziness.  ?Psychiatric/Behavioral:  Positive for confusion. Negative for sleep disturbance.   ?All other systems reviewed and are negative. ? ?Vitals:  ? 05/20/21 1043  ?BP: 130/78  ?Pulse: 90  ?Resp: 18  ?Temp: 97.6 ?F (36.4 ?C)  ?SpO2: 98%  ?Weight: 136 lb 6.4 oz (61.9 kg)  ?Height: '5\' 8"'$  (1.727 m)  ? ?Body mass index is 20.74 kg/m?Marland Kitchen ?Physical Exam ?Vitals reviewed.  ?Constitutional:   ?   Comments: Somnolent  ?HENT:  ?   Head: Normocephalic.  ?   Mouth/Throat:  ?   Mouth: Mucous membranes are moist.  ?   Pharynx: Oropharynx is clear.  ?Eyes:  ?   Pupils: Pupils are equal, round, and reactive  to light.  ?Cardiovascular:  ?   Rate and Rhythm: Normal rate and regular rhythm.  ?   Pulses: Normal pulses.  ?   Heart sounds: Murmur heard.  ?Pulmonary:  ?   Effort: Pulmonary effort is normal. No respiratory

## 2021-05-26 ENCOUNTER — Encounter: Payer: Self-pay | Admitting: Hematology and Oncology

## 2021-06-03 ENCOUNTER — Non-Acute Institutional Stay (SKILLED_NURSING_FACILITY): Payer: Medicare PPO | Admitting: Adult Health

## 2021-06-03 ENCOUNTER — Encounter: Payer: Self-pay | Admitting: Adult Health

## 2021-06-03 ENCOUNTER — Encounter: Payer: Self-pay | Admitting: Internal Medicine

## 2021-06-03 DIAGNOSIS — R600 Localized edema: Secondary | ICD-10-CM

## 2021-06-03 DIAGNOSIS — E871 Hypo-osmolality and hyponatremia: Secondary | ICD-10-CM | POA: Diagnosis not present

## 2021-06-03 DIAGNOSIS — C20 Malignant neoplasm of rectum: Secondary | ICD-10-CM

## 2021-06-03 NOTE — Progress Notes (Signed)
?Location:  [place] ?  ?Place of Service:  SNF (31) ?Provider:   ?Cindi Carbon, ANP ?[place] ?[phone] ? ? ?[name], MD ? ?Patient Care Team: ?[name], MD as PCP - General (Internal Medicine) ?[facility] ?Monna Fam, MD as Consulting Physician (Ophthalmology) ?Benay Pike, MD as Consulting Physician (Hematology and Oncology) ? ?Extended Emergency Contact Information ?Primary Emergency Contact: Pettigrew,Christine ?Address: [address] ?         [place], [place] [address] [place] ?Home Phone: [phone] ?Mobile Phone: [phone] ?Relation: Daughter ? ?Code Status:  DNR ?Goals of care: Advanced Directive information ? ?  05/14/2021  ?  8:30 AM  ?Advanced Directives  ?Does Patient Have a Medical Advance Directive? Yes  ?Type of Advance Directive Out of facility DNR (pink MOST or yellow form)  ?Does patient want to make changes to medical advance directive? No - Patient declined  ? ? ? ?Chief Complaint  ?Patient presents with  ? Acute Visit  ?  edema  ? ? ?HPI:  ?Pt is a 88 y.o. male seen today for an acute visit for edema.  ?PMH significant for iron def anemia due to chronic blood loss and adenocarcinoma of the rectum. He has chosen to forego further treatment and his goals of care are comfort based. He has declined hospice care but resides in skilled care and has family support ? ?The nurse reports that he is more confused. Also reported he has 3+ edema in both feet and some weeping. He denied being in pain. Does have morphine ordered if needed as well as ativan.  ?He is not sob.He is not up and walking but sits in the chair and is using the wheelchair for mobility. He has become weaker since his hospitalization in March due to anemia and hyponatremia. He received 1 unit of PRBCs. Hgb 7.6 prior to transfusion. Na 127 05/15/21 ? ?Also he unfortunately fell and hit his head and suffered a laceration which required  staples 3/19.  No CT scan per goals of care.  ? ?Past Medical History:  ?Diagnosis Date  ? Actinic keratosis 04/05/2012  ? Insomnia, unspecified 06/08/2008  ? Lumbago 01/02/2003  ? Neoplasm of uncertain behavior of skin 04/05/2012  ? Pain in limb 02/05/2011  ? Retinal detachment 2000  ? left eye  ? Screening cholesterol level   ? Shortness of breath 09/07/2008  ? Vitamin D deficiency 08/04/2012  ? ?Past Surgical History:  ?Procedure Laterality Date  ? BIOPSY  08/23/2020  ? Procedure: BIOPSY;  Surgeon: Doran Stabler, MD;  Location: [facility];  Service: Gastroenterology;;  ? COLONOSCOPY WITH PROPOFOL N/A 08/23/2020  ? Procedure: COLONOSCOPY WITH PROPOFOL;  Surgeon: Doran Stabler, MD;  Location: [facility];  Service: Gastroenterology;  Laterality: N/A;  ? ESOPHAGOGASTRODUODENOSCOPY (EGD) WITH PROPOFOL N/A 08/23/2020  ? Procedure: ESOPHAGOGASTRODUODENOSCOPY (EGD) WITH PROPOFOL;  Surgeon: Doran Stabler, MD;  Location: [facility];  Service: Gastroenterology;  Laterality: N/A;  ? EXTERNAL EAR SURGERY  05/2012  ? left ear   ? INGUINAL HERNIA REPAIR Right 01/21/2005  ? Dr. [name]  ? IR IMAGING GUIDED PORT INSERTION  10/09/2020  ? LID LESION EXCISION  2009  ? MOHS SURGERY Left 05/28/2012  ? ear lobe Dr. [name]  ? RETINAL DETACHMENT SURGERY Left 1997  ? [place] INJECTION  08/23/2020  ? Procedure: SUBMUCOSAL INJECTION;  Surgeon: Doran Stabler, MD;  Location: [facility];  Service: Gastroenterology;;  ? ? ?Allergies  ?Allergen Reactions  ? Benzalkonium Chloride Rash  ?  Very allergic per patient   ? Maxitrol [Neomycin-Polymyxin-Dexameth] Itching and Rash  ? Neosporin [Neomycin-Bacitracin Zn-Polymyx] Itching and Rash  ? ? ?Outpatient Encounter Medications as of 06/03/2021  ?Medication Sig  ? ATROPINE SULFATE PO Take 4 drops by mouth every 6 (six) hours as needed.  ? bisacodyl (DULCOLAX) 10 MG suppository Place 10 mg rectally daily as needed for moderate constipation.  ? loperamide HCl (IMODIUM) 2 MG/15ML  solution Take 2 mg by mouth as needed for diarrhea or loose stools.  ? LORazepam (ATIVAN) 0.5 MG tablet Take 1 tablet (0.5 mg total) by mouth every 4 (four) hours as needed for anxiety.  ? melatonin 3 MG TABS tablet Take 3 mg by mouth at bedtime.  ? Morphine Sulfate (MORPHINE CONCENTRATE) 10 mg / 0.5 ml concentrated solution Take by mouth every 4 (four) hours as needed for severe pain. 20 mg/ml; amt: 5 mg; sublingual  ? ondansetron (ZOFRAN) 8 MG tablet Take 8 mg by mouth every 8 (eight) hours as needed for nausea or vomiting.  ? Polyethyl Glycol-Propyl Glycol (SYSTANE OP) Apply 1 drop to eye daily as needed (dry eyes).  ? ?No facility-administered encounter medications on file as of 06/03/2021.  ? ? ?Review of Systems  ?Constitutional:  Positive for activity change and appetite change.  ?Respiratory:  Negative for cough, shortness of breath and wheezing.   ?Cardiovascular:  Positive for leg swelling. Negative for chest pain and palpitations.  ?Gastrointestinal:  Positive for abdominal distention. Negative for constipation, diarrhea, nausea and vomiting.  ?Genitourinary:  Negative for difficulty urinating.  ?Musculoskeletal:  Positive for gait problem.  ?Neurological:  Negative for facial asymmetry, speech difficulty, light-headedness and headaches.  ?Psychiatric/Behavioral:  Positive for confusion.   ? ?Immunization History  ?Administered Date(s) Administered  ? Hep A / Hep B 07/21/2001, 08/18/2001  ? Hepatitis A, Ped/Adol-2 Dose 01/24/2002  ? IPV 08/18/2001  ? Influenza Whole 12/02/2011, 12/31/2012  ? Influenza, High Dose Seasonal PF 12/10/2017, 12/10/2018, 12/30/2019  ? Influenza,inj,Quad PF,6+ Mos 12/20/2013  ? Influenza-Unspecified 12/21/2014, 12/27/2015, 12/22/2016, 12/10/2018, 12/05/2020  ? Moderna SARS-COV2 Booster Vaccination 12/12/2020  ? Moderna Sars-Covid-2 Vaccination 03/25/2019, 04/12/2019, 01/17/2020  ? Pneumococcal Conjugate-13 11/22/2014  ? Pneumococcal Polysaccharide-23 07/21/2001, 12/03/2016  ? Td  04/12/2009  ? Tdap 12/10/2017, 05/07/2020  ? Typhoid Inactivated 07/21/2001, 04/12/2009  ? Zoster, Live 02/01/2011  ? ?Pertinent  Health Maintenance Due  ?Topic Date Due  ? INFLUENZA VACCINE  10/01/2021  ? ? ?  05/13/2021  ? 11:16 PM 05/14/2021  ?  9:00 AM 05/14/2021  ?  9:00 PM 05/15/2021  ?  9:00 AM 05/19/2021  ?  5:59 PM  ?Fall Risk  ?Patient Fall Risk Level Moderate fall risk High fall risk High fall risk High fall risk High fall risk  ? ?Functional Status Survey: ?  ? ?Vitals:  ? 06/03/21 1027  ?BP: 108/74  ?Pulse: 75  ?Resp: 17  ?Temp: 98.1 ?F (36.7 ?C)  ?SpO2: 92%  ? ?There is no height or weight on file to calculate BMI. ?Physical Exam ?Vitals reviewed.  ?Constitutional:   ?   General: He is not in acute distress. ?   Appearance: He is not diaphoretic.  ?HENT:  ?   Head: Normocephalic and atraumatic.  ?   Mouth/Throat:  ?   Mouth: Mucous membranes are moist.  ?   Pharynx: Oropharynx is clear.  ?Neck:  ?   Thyroid: No thyromegaly.  ?  Vascular: No JVD.  ?   Trachea: No tracheal deviation.  ?Cardiovascular:  ?   Rate and Rhythm: Regular rhythm. Tachycardia present.  ?   Heart sounds: Murmur heard.  ?Pulmonary:  ?   Effort: Pulmonary effort is normal. No respiratory distress.  ?   Breath sounds: Normal breath sounds. No wheezing.  ?Abdominal:  ?   General: Bowel sounds are normal. There is no distension (chronic).  ?   Palpations: Abdomen is soft.  ?   Tenderness: There is no abdominal tenderness.  ?Musculoskeletal:  ?   Comments: BLE 3+ edema. Weeping to right lower ext. Skin tear to RLE. No redness or warmth.   ?Lymphadenopathy:  ?   Cervical: No cervical adenopathy.  ?Skin: ?   General: Skin is warm and dry.  ?Neurological:  ?   Mental Status: He is alert.  ?   Cranial Nerves: No cranial nerve deficit.  ?   Comments: Oriented to self but confused about the details. No focal deficit.   ? ? ?Labs reviewed: ?Recent Labs  ?  05/13/21 ?2320 05/14/21 ?0901 05/15/21 ?1013  ?NA 127* 129* 127*  ?K 4.0 3.7 3.6  ?CL 92*  94* 94*  ?CO2 '25 26 26  '$ ?GLUCOSE 103* 92 119*  ?BUN '15 12 11  '$ ?CREATININE 0.53* 0.49* 0.55*  ?CALCIUM 9.2 8.4* 8.3*  ?MG  --  2.1  --   ?PHOS  --  3.8  --   ? ?Recent Labs  ?  10/23/20 ?0858 11/01/20 ?00

## 2021-06-04 ENCOUNTER — Encounter: Payer: Self-pay | Admitting: Orthopedic Surgery

## 2021-06-04 NOTE — Progress Notes (Signed)
This encounter was created in error - please disregard.

## 2021-06-04 NOTE — Progress Notes (Deleted)
?Location:  [facility] ?Nursing Home Room Number: 143/A ?Place of Service:  SNF (31) ?Provider:  Yvonna Alanis, NP ? ?Patient Care Team: ?[name], MD as PCP - General (Internal Medicine) ?[facility] ?Monna Fam, MD as Consulting Physician (Ophthalmology) ?Benay Pike, MD as Consulting Physician (Hematology and Oncology) ? ?Extended Emergency Contact Information ?Primary Emergency Contact: Trickey,Christine ?Address: [address] ?         [place], [place] [address] [place] ?Home Phone: [phone] ?Mobile Phone: [phone] ?Relation: Daughter ? ?Code Status:  DNR ?Goals of care: Advanced Directive information ? ?  06/04/2021  ?  9:29 AM  ?Advanced Directives  ?Does Patient Have a Medical Advance Directive? Yes  ?Type of [place] of [place];Living will;Out of facility DNR (pink MOST or yellow form)  ?Does patient want to make changes to medical advance directive? No - Patient declined  ?Copy of [place] in Chart? Yes - validated most recent copy scanned in chart (See row information)  ?Pre-existing out of facility DNR order (yellow form or pink MOST form) Pink MOST form placed in chart (order not valid for inpatient use);Yellow form placed in chart (order not valid for inpatient use)  ? ? ? ?Chief Complaint  ?Patient presents with  ? Acute Visit  ?  Lower leg edema  ? ? ?HPI:  ?Pt is a 88 y.o. male seen today for an acute visit for  ? ? ?Past Medical History:  ?Diagnosis Date  ? Actinic keratosis 04/05/2012  ? Insomnia, unspecified 06/08/2008  ? Lumbago 01/02/2003  ? Neoplasm of uncertain behavior of skin 04/05/2012  ? Pain in limb 02/05/2011  ? Retinal detachment 2000  ? left eye  ? Screening cholesterol level   ? Shortness of breath 09/07/2008  ? Vitamin D deficiency 08/04/2012  ? ?Past Surgical History:  ?Procedure Laterality Date  ? BIOPSY  08/23/2020  ? Procedure: BIOPSY;  Surgeon: Doran Stabler, MD;   Location: [facility];  Service: Gastroenterology;;  ? COLONOSCOPY WITH PROPOFOL N/A 08/23/2020  ? Procedure: COLONOSCOPY WITH PROPOFOL;  Surgeon: Doran Stabler, MD;  Location: [facility];  Service: Gastroenterology;  Laterality: N/A;  ? ESOPHAGOGASTRODUODENOSCOPY (EGD) WITH PROPOFOL N/A 08/23/2020  ? Procedure: ESOPHAGOGASTRODUODENOSCOPY (EGD) WITH PROPOFOL;  Surgeon: Doran Stabler, MD;  Location: [facility];  Service: Gastroenterology;  Laterality: N/A;  ? EXTERNAL EAR SURGERY  05/2012  ? left ear   ? INGUINAL HERNIA REPAIR Right 01/21/2005  ? Dr. [name]  ? IR IMAGING GUIDED PORT INSERTION  10/09/2020  ? LID LESION EXCISION  2009  ? MOHS SURGERY Left 05/28/2012  ? ear lobe Dr. [name]  ? RETINAL DETACHMENT SURGERY Left 1997  ? [place] INJECTION  08/23/2020  ? Procedure: SUBMUCOSAL INJECTION;  Surgeon: Doran Stabler, MD;  Location: [facility];  Service: Gastroenterology;;  ? ? ?Allergies  ?Allergen Reactions  ? Benzalkonium Chloride Rash  ?  Very allergic per patient   ? Maxitrol [Neomycin-Polymyxin-Dexameth] Itching and Rash  ? Neosporin [Neomycin-Bacitracin Zn-Polymyx] Itching and Rash  ? ? ?Outpatient Encounter Medications as of 06/04/2021  ?Medication Sig  ? ATROPINE SULFATE PO Take 4 drops by mouth every 6 (six) hours as needed.  ? bisacodyl (DULCOLAX) 10 MG suppository Place 10 mg rectally daily as needed for moderate constipation.  ? loperamide HCl (IMODIUM) 2 MG/15ML solution Take 2 mg by mouth as needed for diarrhea or loose stools.  ?  LORazepam (ATIVAN) 0.5 MG tablet Take 1 tablet (0.5 mg total) by mouth every 4 (four) hours as needed for anxiety.  ? melatonin 3 MG TABS tablet Take 3 mg by mouth at bedtime.  ? Morphine Sulfate (MORPHINE CONCENTRATE) 10 mg / 0.5 ml concentrated solution Take by mouth every 4 (four) hours as needed for severe pain. 20 mg/ml; amt: 5 mg; sublingual  ? ondansetron (ZOFRAN) 8 MG tablet Take 8 mg by mouth as needed for nausea or vomiting.  ?  Polyethyl Glycol-Propyl Glycol (SYSTANE OP) Apply 1 drop to eye 3 (three) times daily as needed (dry eyes).  ? ?No facility-administered encounter medications on file as of 06/04/2021.  ? ? ?Review of Systems ? ?Immunization History  ?Administered Date(s) Administered  ? Hep A / Hep B 07/21/2001, 08/18/2001  ? Hepatitis A, Ped/Adol-2 Dose 01/24/2002  ? IPV 08/18/2001  ? Influenza Whole 12/02/2011, 12/31/2012  ? Influenza, High Dose Seasonal PF 12/10/2017, 12/10/2018, 12/30/2019  ? Influenza,inj,Quad PF,6+ Mos 12/20/2013  ? Influenza-Unspecified 12/21/2014, 12/27/2015, 12/22/2016, 12/10/2018, 12/05/2020  ? Moderna SARS-COV2 Booster Vaccination 12/12/2020  ? Moderna Sars-Covid-2 Vaccination 03/25/2019, 04/12/2019, 01/17/2020  ? Pneumococcal Conjugate-13 11/22/2014  ? Pneumococcal Polysaccharide-23 07/21/2001, 12/03/2016  ? Td 04/12/2009  ? Tdap 12/10/2017, 05/07/2020  ? Typhoid Inactivated 07/21/2001, 04/12/2009  ? Zoster, Live 02/01/2011  ? ?Pertinent  Health Maintenance Due  ?Topic Date Due  ? INFLUENZA VACCINE  10/01/2021  ? ? ?  05/13/2021  ? 11:16 PM 05/14/2021  ?  9:00 AM 05/14/2021  ?  9:00 PM 05/15/2021  ?  9:00 AM 05/19/2021  ?  5:59 PM  ?Fall Risk  ?Patient Fall Risk Level Moderate fall risk High fall risk High fall risk High fall risk High fall risk  ? ?Functional Status Survey: ?  ? ?Vitals:  ? 06/04/21 0924  ?BP: (!) 143/80  ?Pulse: (!) 102  ?Resp: 18  ?Temp: 98.1 ?F (36.7 ?C)  ?SpO2: 91%  ?Weight: 136 lb 6.4 oz (61.9 kg)  ?Height: '5\' 8"'$  (1.727 m)  ? ?Body mass index is 20.74 kg/m?Marland Kitchen ?Physical Exam ? ?Labs reviewed: ?Recent Labs  ?  05/13/21 ?2320 05/14/21 ?0901 05/15/21 ?1013  ?NA 127* 129* 127*  ?K 4.0 3.7 3.6  ?CL 92* 94* 94*  ?CO2 '25 26 26  '$ ?GLUCOSE 103* 92 119*  ?BUN '15 12 11  '$ ?CREATININE 0.53* 0.49* 0.55*  ?CALCIUM 9.2 8.4* 8.3*  ?MG  --  2.1  --   ?PHOS  --  3.8  --   ? ?Recent Labs  ?  10/23/20 ?0858 11/01/20 ?0000 02/11/21 ?1438 05/13/21 ?2320  ?AST 21 17 11* 33  ?ALT '17 21 10 '$ 40  ?ALKPHOS 79 89 81 76   ?BILITOT 0.4  --  0.3 0.4  ?PROT 6.2*  --  6.6 6.5  ?ALBUMIN 3.3* 3.6 2.9* 3.1*  ? ?Recent Labs  ?  10/23/20 ?0858 11/01/20 ?0000 02/11/21 ?1438 05/13/21 ?2320 05/14/21 ?0901 05/14/21 ?1513 05/14/21 ?2113  ?WBC 4.6   < > 11.8* 14.6* 12.2*  --   --   ?NEUTROABS 3.2  --  9.6* 12.0*  --   --   --   ?HGB 9.2*   < > 9.8* 8.2* 7.6* 7.9* 7.1*  ?HCT 27.3*   < > 29.6* 26.0* 24.1* 24.6* 22.0*  ?MCV 80.8  --  88.1 77.6* 79.5*  --   --   ?PLT 281   < > 449* 453* 427*  --   --   ? < > = values in  this interval not displayed.  ? ?Lab Results  ?Component Value Date  ? TSH 2.045 02/11/2021  ? ?Lab Results  ?Component Value Date  ? HGBA1C 5.9 03/09/2012  ? ?Lab Results  ?Component Value Date  ? CHOL 189 12/16/2018  ? HDL 69 12/16/2018  ? [place] 111 12/16/2018  ? TRIG 45 12/16/2018  ? ? ?Significant Diagnostic Results in last 30 days:  ?DG Chest Port 1 View ? ?Result Date: 05/14/2021 ?CLINICAL DATA:  Fevers. EXAM: PORTABLE CHEST 1 VIEW COMPARISON:  09/05/2020 FINDINGS: Right chest wall port a catheter noted with tip in the projection of the cavoatrial junction. Heart size is normal. No pleural effusion or edema. No airspace opacities identified. Visualized osseous structures are unremarkable. IMPRESSION: No acute cardiopulmonary abnormalities. Electronically Signed   By: Kerby Moors M.D.   On: 05/14/2021 06:30   ? ?Assessment/Plan ?There are no diagnoses linked to this encounter. ? ? ?Family/ staff Communication: *** ? ?Labs/tests ordered:  *** ? ?

## 2021-06-25 ENCOUNTER — Encounter: Payer: Self-pay | Admitting: Hematology and Oncology

## 2021-07-01 DEATH — deceased

## 2021-09-23 ENCOUNTER — Other Ambulatory Visit: Payer: Self-pay

## 2023-01-21 NOTE — Telephone Encounter (Signed)
Telephone call
